# Patient Record
Sex: Female | Born: 1995 | Race: White | Hispanic: No | Marital: Single | Smoking: Current every day smoker
Health system: Southern US, Community
[De-identification: ages and names within clinical notes are randomized; demographics above are authoritative.]

## PROBLEM LIST (undated history)

## (undated) ENCOUNTER — Inpatient Hospital Stay (HOSPITAL_COMMUNITY): Payer: Self-pay

## (undated) DIAGNOSIS — N200 Calculus of kidney: Secondary | ICD-10-CM

## (undated) DIAGNOSIS — R002 Palpitations: Secondary | ICD-10-CM

## (undated) DIAGNOSIS — F32A Depression, unspecified: Secondary | ICD-10-CM

## (undated) DIAGNOSIS — R519 Headache, unspecified: Secondary | ICD-10-CM

## (undated) DIAGNOSIS — N390 Urinary tract infection, site not specified: Secondary | ICD-10-CM

## (undated) DIAGNOSIS — F329 Major depressive disorder, single episode, unspecified: Secondary | ICD-10-CM

## (undated) DIAGNOSIS — N946 Dysmenorrhea, unspecified: Secondary | ICD-10-CM

## (undated) DIAGNOSIS — G8929 Other chronic pain: Secondary | ICD-10-CM

## (undated) DIAGNOSIS — D649 Anemia, unspecified: Secondary | ICD-10-CM

## (undated) DIAGNOSIS — R51 Headache: Secondary | ICD-10-CM

## (undated) DIAGNOSIS — F419 Anxiety disorder, unspecified: Secondary | ICD-10-CM

## (undated) DIAGNOSIS — R102 Pelvic and perineal pain unspecified side: Secondary | ICD-10-CM

## (undated) DIAGNOSIS — IMO0001 Reserved for inherently not codable concepts without codable children: Secondary | ICD-10-CM

## (undated) DIAGNOSIS — N809 Endometriosis, unspecified: Secondary | ICD-10-CM

## (undated) HISTORY — DX: Other chronic pain: G89.29

## (undated) HISTORY — DX: Pelvic and perineal pain unspecified side: R10.20

## (undated) HISTORY — DX: Dysmenorrhea, unspecified: N94.6

---
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
## 2015-01-07 ENCOUNTER — Emergency Department: Payer: Self-pay | Admitting: Emergency Medicine

## 2015-01-21 ENCOUNTER — Ambulatory Visit (INDEPENDENT_AMBULATORY_CARE_PROVIDER_SITE_OTHER): Payer: Self-pay | Admitting: Nurse Practitioner

## 2015-01-21 ENCOUNTER — Encounter: Payer: Self-pay | Admitting: Nurse Practitioner

## 2015-01-21 VITALS — BP 128/64 | HR 90 | Temp 97.7°F | Resp 12

## 2015-01-21 DIAGNOSIS — L42 Pityriasis rosea: Secondary | ICD-10-CM

## 2015-01-21 DIAGNOSIS — Z7689 Persons encountering health services in other specified circumstances: Secondary | ICD-10-CM

## 2015-01-21 DIAGNOSIS — Z7189 Other specified counseling: Secondary | ICD-10-CM

## 2015-01-21 NOTE — Assessment & Plan Note (Signed)
Discussed acute and chronic issues. Reviewed health maintenance measures, PFSHx, and immunizations.   

## 2015-01-21 NOTE — Assessment & Plan Note (Signed)
Improving with corticosteroid cream and time.

## 2015-01-21 NOTE — Patient Instructions (Signed)
Welcome to [facility]!   Call us if you need us.

## 2015-01-21 NOTE — Progress Notes (Signed)
Pre visit review using our clinic review tool, if applicable. No additional management support is needed unless otherwise documented below in the visit note. 

## 2015-01-21 NOTE — Progress Notes (Signed)
Subjective:    Patient ID: Connie Fernandez, female    DOB: Apr 28, 1996, 19 y.o.   MRN: [id number]  HPI  Connie Fernandez is a 19 yo female establishing care with a CC of rash.   1) New pt info:    Diet- Eats out often, eats meat   Exercise- Not formal  Immunizations- Up to date   Pap- 2016- Normal   Eye Exam- 3 years ago   Dental Exam- UTD  2) Chronic Problems-   Dysmenorrhea controlled on Depo-provera.   3) Acute Problems-  Rash- First noticed 2 weeks ago. She reports being seen twice for this, once in the ER, and once at the Dermatology office. She was diagnosed with Pityriasis Rosea and given a steroid cream. She reports it is improving and non-pruritic currently.  Other providers-  OB[facility]GYN at [facility]- Dr. Archie Balboa   Review of Systems  Constitutional: Negative for fever, chills, diaphoresis and fatigue.  HENT: Negative for tinnitus and trouble swallowing.   Eyes: Negative for visual disturbance.  Respiratory: Negative for chest tightness, shortness of breath and wheezing.   Cardiovascular: Negative for chest pain, palpitations and leg swelling.  Gastrointestinal: Negative for nausea, vomiting, diarrhea and constipation.  Genitourinary: Negative for dysuria.  Musculoskeletal: Negative for back pain and neck pain.  Skin: Positive for rash.  Neurological: Negative for dizziness, weakness, numbness and headaches.  Hematological: Does not bruise/bleed easily.  Psychiatric/Behavioral: Negative for suicidal ideas and sleep disturbance. The patient is not nervous/anxious.    Past Medical History  Diagnosis Date  . Dysmenorrhea in adolescent     History   Social History  . Marital Status: Single    Spouse Name: N/A  . Number of Children: N/A  . Years of Education: N/A   Occupational History  . Not on file.   Social History Main Topics  . Smoking status: Never Smoker   . Smokeless tobacco: Never Used  . Alcohol Use: No  . Drug Use: No  .  Sexual Activity:    Partners: Male    Birth Control/ Protection: Condom   Other Topics Concern  . Not on file   Social History Narrative   Lives with mother, father, and 2 sisters    [employer]/video editor and works [employer]    1 dog    Right handed    Enjoys working, Chief of Staff out with friends             No past surgical history on file.  Family History  Problem Relation Age of Onset  . Diabetes Mother   . Alcohol abuse Father   . Hypertension Father   . COPD Maternal Grandmother   . Early death Maternal Grandmother 47  . Heart disease Maternal Grandmother   . Heart disease Maternal Grandfather 72  . Parkinsonism Paternal Grandmother     No Known Allergies  No current outpatient prescriptions on file prior to visit.   Current Facility-Administered Medications on File Prior to Visit  Medication Dose Route Frequency Provider Last Rate Last Dose  . medroxyPROGESTERone (DEPO-PROVERA) injection 150 mg  150 mg Intramuscular Q90 days Allie Bossier, MD   150 mg at 11/19/14 1128       Objective:   Physical Exam  Constitutional: She is oriented to person, place, and time. She appears well-developed and well-nourished. No distress.  HENT:  Head: Normocephalic and atraumatic.  Right Ear: External ear normal.  Left Ear: External ear normal.  Eyes: Right eye exhibits no  discharge. Left eye exhibits no discharge. No scleral icterus.  Neck: Normal range of motion. Neck supple. No thyromegaly present.  Cardiovascular: Normal rate, regular rhythm, normal heart sounds and intact distal pulses.  Exam reveals no gallop and no friction rub.   No murmur heard. Pulmonary/Chest: Effort normal and breath sounds normal. No respiratory distress. She has no wheezes. She has no rales. She exhibits no tenderness.  Lymphadenopathy:    She has no cervical adenopathy.  Neurological: She is alert and oriented to person, place, and time. No cranial nerve deficit. She exhibits normal muscle tone.  Coordination normal.  Skin: Skin is warm and dry. No rash noted. She is not diaphoretic.  Tattoos and septum piercing.   Psychiatric: She has a normal mood and affect. Her behavior is normal. Judgment and thought content normal.      Assessment & Plan:

## 2015-03-22 ENCOUNTER — Encounter: Payer: Self-pay | Admitting: Obstetrics & Gynecology

## 2015-03-22 ENCOUNTER — Ambulatory Visit (INDEPENDENT_AMBULATORY_CARE_PROVIDER_SITE_OTHER): Payer: BLUE CROSS/BLUE SHIELD | Admitting: Obstetrics & Gynecology

## 2015-03-22 ENCOUNTER — Telehealth: Payer: Self-pay | Admitting: *Deleted

## 2015-03-22 DIAGNOSIS — N946 Dysmenorrhea, unspecified: Secondary | ICD-10-CM

## 2015-03-22 DIAGNOSIS — Z30011 Encounter for initial prescription of contraceptive pills: Secondary | ICD-10-CM

## 2015-03-22 DIAGNOSIS — N939 Abnormal uterine and vaginal bleeding, unspecified: Secondary | ICD-10-CM | POA: Diagnosis not present

## 2015-03-22 LAB — CBC
HCT: 33.6 % — ABNORMAL LOW (ref 36.0–46.0)
Hemoglobin: 11.2 g/dL — ABNORMAL LOW (ref 12.0–15.0)
MCH: 27.9 pg (ref 26.0–34.0)
MCHC: 33.3 g/dL (ref 30.0–36.0)
MCV: 83.6 fL (ref 78.0–100.0)
MPV: 9.9 fL (ref 8.6–12.4)
Platelets: 244 10*3/uL (ref 150–400)
RBC: 4.02 MIL/uL (ref 3.87–5.11)
RDW: 13.1 % (ref 11.5–15.5)
WBC: 5.6 10*3/uL (ref 4.0–10.5)

## 2015-03-22 MED ORDER — NORETHIN ACE-ETH ESTRAD-FE 1-20 MG-MCG(24) PO TABS: 1.0000 | ORAL_TABLET | Freq: Every day | ORAL | Status: DC

## 2015-03-22 NOTE — Telephone Encounter (Signed)
Patient called and needed her birth control sent in with a 90 day supply.   I have sent in refills with 90 day refills.

## 2015-03-22 NOTE — Progress Notes (Signed)
   CLINIC ENCOUNTER NOTE  History:  19 y.o. F here today for birth control pills. Is on Accutane for acne, wants to be on contraception.  Did not like Depo Provera, missed 02/18/15 dosage and has had heavy bleeding since then. Feels very tired occasionally. Had tried Nexplanon in past, just wants to go back to OCPs. Accompanied by mother.  Past Medical History  Diagnosis Date  . Dysmenorrhea in adolescent    History reviewed. No pertinent past surgical history.  The following portions of the patient's history were reviewed and updated as appropriate: allergies, current medications, past family history, past medical history, past social history, past surgical history and problem list.   Health Maintenance: Has received Gardasil series  Review of Systems:  Comprehensive review of systems was otherwise negative.  Objective:  Physical Exam AFVSS CONSTITUTIONAL: Well-developed, well-nourished female in no acute distress.  HENT:  Normocephalic, atraumatic, External right and left ear normal. Oropharynx is clear and moist EYES: Conjunctivae and EOM are normal. Pupils are equal, round, and reactive to light. No scleral icterus.  NECK: Normal range of motion, supple, no masses SKIN: Skin is warm and dry. No rash noted. Not diaphoretic. No erythema. No pallor. NEUROLGIC: Alert and oriented to person, place, and time. Normal reflexes, muscle tone coordination. No cranial nerve deficit noted. PSYCHIATRIC: Normal mood and affect. Normal behavior. Normal judgment and thought content. CARDIOVASCULAR: Normal heart rate noted RESPIRATORY: Effort and breath sounds normal, no problems with respiration noted ABDOMEN: Soft, no distention noted.  PELVIC: Deferred MUSCULOSKELETAL: Normal range of motion. No edema and no tenderness.   Assessment & Plan:  CBC checked today Loestrin 24 Fe prescribed today, adherence emphasized. Told to take 2 pills/day for 4 days then one pill a day given current heavy  bleeding. Bleeding precautions reviewed. Routine preventative health maintenance measures emphasized.  Jaynie CollinsUGONNA  Lovinia Snare, MD, FACOG Attending Obstetrician & Gynecologist Center for [facility], [facility]

## 2015-05-11 ENCOUNTER — Encounter: Payer: Self-pay | Admitting: Family Medicine

## 2015-05-11 ENCOUNTER — Ambulatory Visit (INDEPENDENT_AMBULATORY_CARE_PROVIDER_SITE_OTHER): Payer: BLUE CROSS/BLUE SHIELD | Admitting: Family Medicine

## 2015-05-11 VITALS — BP 102/60 | HR 73 | Temp 97.6°F | Wt 115.5 lb

## 2015-05-11 DIAGNOSIS — B349 Viral infection, unspecified: Secondary | ICD-10-CM | POA: Diagnosis not present

## 2015-05-11 MED ORDER — AZITHROMYCIN 250 MG PO TABS: ORAL_TABLET | ORAL | Status: DC

## 2015-05-11 NOTE — Assessment & Plan Note (Signed)
Likely, nontoxic.  Supportive care. F/u prn.  D/w pt.  See AVS.   Would hold off on abx tx since likely viral but if discolored sputum >1 week would start zmax.   D/w pt about smoking.   Work note given.   abd pain likely from strained muscle from vomiting mult times.  No acute abd process on exam.

## 2015-05-11 NOTE — Patient Instructions (Signed)
Tylenol for pain.  Rest and fluids.  Take mucinex for the sputum/cough.  Gargle with salt water if needed.  If still with cough/discolored sputum on 05/15/15, then start the antibiotics.  Take care.

## 2015-05-11 NOTE — Progress Notes (Signed)
Pre visit review using our clinic review tool, if applicable. No additional management support is needed unless otherwise documented below in the visit note.  Sx started about 4 days ago.  Out of work yesterday due to vomiting.   Initially with vomiting Sunday, some abd pain.  No blood in vomit.  No diarrhea.   No fevers but had chills.   Some arm/muscle aches.  No rash.   ST.  Stuffy.  No ear pain.  Some cough, yellowish sputum.   Smoker, d/w pt about cessation, encouraged.   No vomiting today, still with some abd pain.    Meds, vitals, and allergies reviewed.   ROS: See HPI.  Otherwise, noncontributory.  GEN: nad, alert and oriented HEENT: mucous membranes moist, tm w/o erythema, nasal exam w/o erythema, clear discharge noted,  OP with mild cobblestoning NECK: supple w/o LA CV: rrr.   PULM: ctab, no inc wob, no wheeze, no rhonchi.  No cough during the exam.  EXT: no edema SKIN: no acute rash Abd soft, mildly ttp on the upper midline rectus sheath w/o rebound.  Rectus reproducibly sore on testing.  Normal BS.

## 2015-05-20 ENCOUNTER — Telehealth: Payer: Self-pay | Admitting: Nurse Practitioner

## 2015-05-20 ENCOUNTER — Telehealth: Payer: Self-pay

## 2015-05-20 NOTE — Telephone Encounter (Signed)
After hours RN, Corrie DandyMary called to inform NP that patient refused after triage call to go to ED per there request.  Patient has had increased Vomiting and dizziness.  S[facility] Para Marchuncan last week [facility] for same issues.

## 2015-05-20 NOTE — Telephone Encounter (Signed)
Left VM to return my call

## 2015-05-20 NOTE — Telephone Encounter (Signed)
Patient Name: Connie Fernandez DOB: 1996-02-23 Initial Comment Caller states daughter has been vomiting , on 3rd day of z-pac, can't keep water down Nurse Assessment Nurse: Deatra JamesNoe, RN, Corrie DandyMary Date/Time (Eastern Time): 05/20/2015 11:23:07 AM Confirm and document reason for call. If symptomatic, describe symptoms. ---Mother states her daughter was [facility] week and was told this was viral and if she continues to spit up green to give her Zpack and she is vomiting. She is vomiting even when she is not taking the medicine and she has been vomiting for two weeks. States her daughter called her today and told her that she feels like she is going to pass out. Has the patient traveled out of the country within the last 30 days? ---No Does the patient require triage? ---Yes Related visit to physician within the last 2 weeks? ---Yes Does the PT have any chronic conditions? (i.e. diabetes, asthma, etc.) ---No Did the patient indicate they were pregnant? ---No Guidelines Guideline Title Affirmed Question Affirmed Notes Vomiting Without Diarrhea Severe dehydration suspected (very dizzy when tries to stand or has fainted) Final Disposition User Go to ED Now Deatra JamesNoe, RN, Corrie DandyMary Comments After triage mother states she doesn't want to take her daughter to ED to be seen that she wants her doctor to see her. Disagree/Comply: Disagree Disagree/Comply Reason: Disagree with instructions

## 2015-05-20 NOTE — Telephone Encounter (Signed)
The patient is advised to be seen by urgent care or ED. If she chooses to come here we will call EMS to escort her to the ED.

## 2015-05-20 NOTE — Telephone Encounter (Signed)
Thanks

## 2015-05-20 NOTE — Telephone Encounter (Signed)
Spoke with pt, advised of Connie Fernandez message.  Pt verbalized understanding 

## 2015-05-27 ENCOUNTER — Ambulatory Visit (INDEPENDENT_AMBULATORY_CARE_PROVIDER_SITE_OTHER): Payer: BLUE CROSS/BLUE SHIELD | Admitting: Nurse Practitioner

## 2015-05-27 VITALS — BP 118/74 | HR 77 | Temp 98.5°F | Resp 14 | Ht 62.0 in | Wt 119.0 lb

## 2015-05-27 DIAGNOSIS — R112 Nausea with vomiting, unspecified: Secondary | ICD-10-CM | POA: Diagnosis not present

## 2015-05-27 NOTE — Progress Notes (Signed)
Pre visit review using our clinic review tool, if applicable. No additional management support is needed unless otherwise documented below in the visit note. 

## 2015-05-27 NOTE — Patient Instructions (Signed)
Work on stopping smoking.   Eat small meals 6 x a day.   Add- Zantac 150 mg OTC for possible acid reflux, take daily before largest meal of the day.   Please visit the lab before leaving today.

## 2015-05-27 NOTE — Progress Notes (Signed)
Subjective:    Patient ID: Connie Fernandez, female    DOB: 13-Apr-1996, 19 y.o.   MRN: [id number]  HPI  [name] is a 19 yo female with a CC of vomiting. She is accompanied by her mother today who is primary historian.   1) Patient has been seen twice before today for same symptoms. Once with Dr. Para March at [facility] and then at [facility].    Pt had full work up of labs (some abstracted) some sent to scan. Normal H. Pylori, negative Monospot, only significant finding was anemia and she was advised to follow up with her OB/GYN  X-ray showed constipation   Pt advised to try Zofran and Miralax.  Nausea- Not taking the Zofran  Miralax- Have not taken for constipation found on x-ray at [facility]  She reports not eating  Did not vomit yesterday or today  Vomit- 20th; twice at night- nauseated   Ice cream and chips- right before throwing up  Gold- color of urine she reports  "Migraines"- once-twice a week, thumping left  Sleeps them off and when wakes up normal Started last month   3 x wakes up with nausea and vomiting   Wt Readings from Last 3 Encounters:  05/27/15 119 lb (53.978 kg) (35 %*, Z = -0.37)  05/11/15 115 lb 8 oz (52.39 kg) (28 %*, Z = -0.58)  11/19/14 119 lb 3.2 oz (54.069 kg) (38 %*, Z = -0.30)   * Growth percentiles are based on CDC 2-20 Years data.   Pt reports losing 13 pounds, but has actually gained 4 lbs according to our system.   Review of Systems  Constitutional: Positive for diaphoresis and fatigue. Negative for fever and chills.  HENT: Positive for sneezing. Negative for congestion, rhinorrhea, sinus pressure and sore throat.   Eyes: Negative for pain and visual disturbance.  Respiratory: Negative for cough, chest tightness, shortness of breath and wheezing.   Cardiovascular: Negative for chest pain, palpitations and leg swelling.  Gastrointestinal: Positive for nausea, vomiting and constipation. Negative for abdominal pain, diarrhea, blood in stool, abdominal  distention, anal bleeding and rectal pain.  Endocrine: Positive for polyuria. Negative for polydipsia and polyphagia.  Genitourinary: Negative for urgency, frequency, flank pain and difficulty urinating.  Musculoskeletal: Negative for back pain and neck pain.       Tight across shoulders  Skin: Negative for rash.  Neurological: Positive for light-headedness and headaches.       Frontal headaches- tylenol/ibuprofen (not helpful)   Psychiatric/Behavioral: Positive for sleep disturbance. The patient is not nervous/anxious.    Past Medical History  Diagnosis Date  . Dysmenorrhea in adolescent     History   Social History  . Marital Status: Single    Spouse Name: N/A  . Number of Children: N/A  . Years of Education: N/A   Occupational History  . Not on file.   Social History Main Topics  . Smoking status: Current Every Day Smoker -- 1.00 packs/day    Types: Cigarettes  . Smokeless tobacco: Never Used  . Alcohol Use: No  . Drug Use: No  . Sexual Activity:    Partners: Male    Birth Control/ Protection: Condom   Other Topics Concern  . 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  Family History  Problem Relation Age of Onset  . Diabetes Mother   . Alcohol abuse Father   . Hypertension Father   . COPD Maternal Grandmother   . Early death Maternal Grandmother 20  . Heart disease Maternal Grandmother   . Heart disease Maternal Grandfather 72  . Parkinsonism Paternal Grandmother     No Known Allergies  Current Outpatient Prescriptions on File Prior to Visit  Medication Sig Dispense Refill  . azithromycin (ZITHROMAX) 250 MG tablet 2 tabs a day for 1 day and then 1 a day for 4 days. Fill on/after 05/15/15. (Patient not taking: Reported on 05/27/2015) 6 each 0   No current  facility-administered medications on file prior to visit.      Objective:   Physical Exam  Constitutional: She is oriented to person, place, and time. She appears well-developed and well-nourished. No distress.  BP 118/74 mmHg  Pulse 77  Temp(Src) 98.5 F (36.9 C)  Resp 14  Ht  (1.575 m)  Wt 119 lb (53.978 kg)  BMI 21.76 kg/m2  SpO2 99%  LMP 05/09/2015   HENT:  Head: Normocephalic and atraumatic.  Right Ear: External ear normal.  Left Ear: External ear normal.  Eyes: EOM are normal. Pupils are equal, round, and reactive to light. Right eye exhibits no discharge. Left eye exhibits no discharge. No scleral icterus.  Neck: Normal range of motion. Neck supple. No thyromegaly present.  Cardiovascular: Normal rate and regular rhythm.   No murmur heard. Pulmonary/Chest: Effort normal and breath sounds normal.  Abdominal: Soft. Bowel sounds are normal. She exhibits no distension and no mass. There is no tenderness. There is no rebound and no guarding.  Thin habitus  Lymphadenopathy:    She has no cervical adenopathy.  Neurological: She is alert and oriented to person, place, and time. No cranial nerve deficit. She exhibits normal muscle tone. Coordination normal.  Skin: Skin is warm and dry. No rash noted. She is not diaphoretic.  Tattoos and piercings   Psychiatric: Her behavior is normal. Judgment and thought content normal.  Flat affect, very little verbage      Assessment & Plan:

## 2015-05-30 ENCOUNTER — Other Ambulatory Visit: Payer: Self-pay | Admitting: Student

## 2015-05-30 DIAGNOSIS — R1084 Generalized abdominal pain: Secondary | ICD-10-CM

## 2015-05-30 LAB — CBC AND DIFFERENTIAL
HCT: 35 % — AB (ref 36–46)
Hemoglobin: 11.2 g/dL — AB (ref 12.0–16.0)
Platelets: 226 10*3/uL (ref 150–399)
WBC: 8.6 10^3/mL

## 2015-05-30 LAB — BASIC METABOLIC PANEL
BUN: 12 mg/dL (ref 4–21)
Creatinine: 0.7 mg/dL (ref 0.5–1.1)
Glucose: 88 mg/dL
Potassium: 3.8 mmol/L (ref 3.4–5.3)
Sodium: 141 mmol/L (ref 137–147)

## 2015-05-30 LAB — HEPATIC FUNCTION PANEL
ALT: 12 U/L (ref 3–30)
AST: 13 U/L (ref 2–40)
Alkaline Phosphatase: 45 U/L (ref 25–125)
Bilirubin, Total: 0.4 mg/dL

## 2015-06-03 ENCOUNTER — Telehealth: Payer: Self-pay | Admitting: Nurse Practitioner

## 2015-06-03 ENCOUNTER — Ambulatory Visit
Admission: RE | Admit: 2015-06-03 | Discharge: 2015-06-03 | Disposition: A | Discharge status: Home / Self Care | Payer: BLUE CROSS/BLUE SHIELD | Source: Ambulatory Visit | Attending: Student | Admitting: Student

## 2015-06-03 DIAGNOSIS — R1084 Generalized abdominal pain: Secondary | ICD-10-CM | POA: Insufficient documentation

## 2015-06-03 NOTE — Telephone Encounter (Signed)
Records abstracted and given to [facility] for review

## 2015-06-03 NOTE — Telephone Encounter (Addendum)
Pt mother came in and dropped off lab work would like to get freed back on results.. Please advise pt mother at [phone]. Placed in Doss box.

## 2015-06-06 ENCOUNTER — Telehealth: Payer: Self-pay | Admitting: *Deleted

## 2015-06-06 NOTE — Telephone Encounter (Signed)
Patient mother called about lab work results that she dropped off on 06/03/15. Patient mother was wondering if Connie Fernandez looked at the results yet. Is requesting a call back . Pt mother # [phone]. Please advice NP.

## 2015-06-06 NOTE — Telephone Encounter (Signed)
Informed pt's mother Misty Stanley) to have Anisten worked up by OB/GYN, take Ferrous sulfate 325 and to repeat labs in 1.5 months.  She verbalized understanding.

## 2015-06-06 NOTE — Telephone Encounter (Signed)
Spoke to pt's mother to let her know that you were given her records and will look at them. She is concerned about Connie Fernandez's iron, hemoglobin and leukocyte panels, she stated that she circled those that were either high or low or that she was concerned about. She also stated that they were still waiting for some more results.

## 2015-06-06 NOTE — Telephone Encounter (Signed)
Please let the mother know that the PA advised Satori to be seen and worked up by an OB/GYN for her anemia. She will need to take over the counter iron supplements Ferrous Sulfate 325 mg daily and have a repeat in 1.5 months (takes 3 months to regenerate red blood cells), but we can see if it trends back up. Thanks!

## 2015-06-08 ENCOUNTER — Encounter: Payer: Self-pay | Admitting: Nurse Practitioner

## 2015-06-08 NOTE — Assessment & Plan Note (Signed)
Pt has not followed any of the advice of the Urgent Care, which I concur with. She was extensively worked up and the mother is the primary historian. The daughter denies a bad home situation, but always looks to the mother to give the history. She was advised to try the recommended therapies she was prescribed (zofran and Miralax). Will repeat her CMET, will ask her to try 6 small meals daily, add Zantac 150 mg up to twice daily. Follow up in 2 weeks.

## 2015-08-10 ENCOUNTER — Encounter (HOSPITAL_COMMUNITY): Payer: Self-pay | Admitting: Emergency Medicine

## 2015-08-10 ENCOUNTER — Emergency Department (HOSPITAL_COMMUNITY)
Admission: EM | Admit: 2015-08-10 | Discharge: 2015-08-10 | Disposition: A | Discharge status: Home / Self Care | Payer: BLUE CROSS/BLUE SHIELD | Attending: Emergency Medicine | Admitting: Emergency Medicine

## 2015-08-10 DIAGNOSIS — Z720 Tobacco use: Secondary | ICD-10-CM | POA: Diagnosis not present

## 2015-08-10 DIAGNOSIS — J020 Streptococcal pharyngitis: Secondary | ICD-10-CM | POA: Insufficient documentation

## 2015-08-10 DIAGNOSIS — L03113 Cellulitis of right upper limb: Secondary | ICD-10-CM | POA: Diagnosis not present

## 2015-08-10 DIAGNOSIS — R110 Nausea: Secondary | ICD-10-CM | POA: Insufficient documentation

## 2015-08-10 DIAGNOSIS — L02413 Cutaneous abscess of right upper limb: Secondary | ICD-10-CM | POA: Insufficient documentation

## 2015-08-10 DIAGNOSIS — M791 Myalgia: Secondary | ICD-10-CM | POA: Insufficient documentation

## 2015-08-10 DIAGNOSIS — Z8742 Personal history of other diseases of the female genital tract: Secondary | ICD-10-CM | POA: Diagnosis not present

## 2015-08-10 DIAGNOSIS — J029 Acute pharyngitis, unspecified: Secondary | ICD-10-CM | POA: Diagnosis present

## 2015-08-10 DIAGNOSIS — L0390 Cellulitis, unspecified: Secondary | ICD-10-CM

## 2015-08-10 DIAGNOSIS — L0291 Cutaneous abscess, unspecified: Secondary | ICD-10-CM

## 2015-08-10 LAB — RAPID STREP SCREEN (MED CTR MEBANE ONLY): Streptococcus, Group A Screen (Direct): POSITIVE — AB

## 2015-08-10 MED ORDER — CEPHALEXIN 500 MG PO CAPS: 500.0000 mg | ORAL_CAPSULE | Freq: Four times a day (QID) | ORAL | Status: DC

## 2015-08-10 MED ORDER — PENICILLIN G BENZATHINE 1200000 UNIT/2ML IM SUSP
1.2000 10*6.[IU] | Freq: Once | INTRAMUSCULAR | Status: AC
  Administered 2015-08-10: 1.2 10*6.[IU] via INTRAMUSCULAR
  Filled 2015-08-10: qty 2

## 2015-08-10 MED ORDER — CEPHALEXIN 250 MG PO CAPS
500.0000 mg | ORAL_CAPSULE | Freq: Once | ORAL | Status: AC
  Administered 2015-08-10: 500 mg via ORAL
  Filled 2015-08-10: qty 2

## 2015-08-10 MED ORDER — IBUPROFEN 400 MG PO TABS: 400.0000 mg | ORAL_TABLET | Freq: Four times a day (QID) | ORAL | Status: DC | PRN

## 2015-08-10 MED ORDER — LIDOCAINE-EPINEPHRINE (PF) 2 %-1:200000 IJ SOLN
10.0000 mL | Freq: Once | INTRAMUSCULAR | Status: AC
  Administered 2015-08-10: 10 mL
  Filled 2015-08-10: qty 20

## 2015-08-10 MED ORDER — SULFAMETHOXAZOLE-TRIMETHOPRIM 800-160 MG PO TABS
1.0000 | ORAL_TABLET | Freq: Two times a day (BID) | ORAL | Status: AC
Start: 2015-08-10 — End: 2015-08-17

## 2015-08-10 NOTE — ED Notes (Signed)
Pt. reports sore throat with swelling , body aches , dry cough and skin redness at right forearm from an infected tattoo 2 days ago .

## 2015-08-10 NOTE — ED Provider Notes (Signed)
CSN: [id number]     Arrival date & time 08/10/15  0023 History  By signing my name below, I, Tanda Rockers, attest that this documentation has been prepared under the direction and in the presence of Derwood Kaplan, MD. Electronically Signed: Tanda Rockers, ED Scribe. 08/10/2015. 2:26 AM.  Chief Complaint  Patient presents with  . Skin Problem  . Sore Throat   The history is provided by the patient. No language interpreter was used.     HPI Comments: Connie Fernandez is a 19 y.o. female who presents to the Emergency Department complaining of gradual onset, constant, sore throat x 1 day. Pt also complains of generalized body aches, throat swelling, difficulty swallowing, voice changes, nausea, fever, and productive cough with yellow phlegm. Pt notes redness and swelling to right forearm x 1 day after getting tattoo done 2 days ago. Pt states that the tattoo parlor is certified. Pt mentions tingling sensation to the tips of the digits on her left hand which is new. Mom states that pt got another tattoo recently and was told at the tattoo parlor that they use 1 needle for every 3 patients. Denies chills, nausea, vomiting, visual changes, chest pain, shortness of breath, or any other associated symptoms. No hx IV drug abuse.   Past Medical History  Diagnosis Date  . Dysmenorrhea in adolescent    History reviewed. No pertinent past surgical history. Family History  Problem Relation Age of Onset  . Diabetes Mother   . Alcohol abuse Father   . Hypertension Father   . COPD Maternal Grandmother   . Early death Maternal Grandmother 46  . Heart disease Maternal Grandmother   . Heart disease Maternal Grandfather 72  . Parkinsonism Paternal Grandmother    Social History  Substance Use Topics  . Smoking status: Current Every Day Smoker -- 0.00 packs/day    Types: Cigarettes  . Smokeless tobacco: Never Used  . Alcohol Use: No   OB History    No data available     Review of Systems   Constitutional: Positive for fever. Negative for chills.  HENT: Positive for sore throat, trouble swallowing and voice change.   Eyes: Negative for visual disturbance.  Respiratory: Positive for cough. Negative for shortness of breath.   Cardiovascular: Negative for chest pain.  Gastrointestinal: Positive for nausea. Negative for vomiting.  Musculoskeletal: Positive for myalgias.  Skin: Positive for color change.    ROS 10 Systems reviewed and are negative for acute change except as noted in the HPI.     Allergies  Review of patient's allergies indicates no known allergies.  Home Medications   Prior to Admission medications   Medication Sig Start Date End Date Taking? Authorizing Provider  cephALEXin (KEFLEX) 500 MG capsule Take 1 capsule (500 mg total) by mouth 4 (four) times daily. 08/10/15   Derwood Kaplan, MD  ibuprofen (ADVIL,MOTRIN) 400 MG tablet Take 1 tablet (400 mg total) by mouth every 6 (six) hours as needed. 08/10/15   Derwood Kaplan, MD  sulfamethoxazole-trimethoprim (BACTRIM DS,SEPTRA DS) 800-160 MG tablet Take 1 tablet by mouth 2 (two) times daily. 08/10/15 08/17/15  Derwood Kaplan, MD   Triage Vitals: BP 126/83 mmHg  Pulse 100  Temp(Src) 98.7 F (37.1 C) (Oral)  Resp 18  Wt 120 lb (54.432 kg)  SpO2 99%  LMP 08/09/2015   Physical Exam  Constitutional: She is oriented to person, place, and time. She appears well-developed and well-nourished. No distress.  HENT:  Head: Normocephalic and atraumatic.  Eyes:  Conjunctivae and EOM are normal.  Neck: Neck supple. No tracheal deviation present.  Cardiovascular: Normal rate.   Pulmonary/Chest: Effort normal. No respiratory distress.  Musculoskeletal: Normal range of motion.  Right upper extremity proximal forearm; 6 cm diameter erythematous lesion with mild induration in the center and some blanching Although distorted from the tattoo it appears that there might be purulence at the center Elbow flexion, extension,  pronation, and supination are all normal Pt has 2+ radial and ulnar pulse Skin is warm to touch Subjective numbness in all digits distal to DIP Pt able to discriminate between sharp and dull for all digits except for right long finger Intrinsic and extrinsic muscles of the hand appear to be functioning normally  Neurological: She is alert and oriented to person, place, and time.  Skin: Skin is warm and dry.  Psychiatric: She has a normal mood and affect. Her behavior is normal.  Nursing note and vitals reviewed.   ED Course  Procedures (including critical care time)  DIAGNOSTIC STUDIES: Oxygen Saturation is 99% on RA, normal by my interpretation.    COORDINATION OF CARE: 2:26 AM-Discussed treatment plan which includes RX antibiotics and I&D with pt at bedside and pt agreed to plan.   Labs Review Labs Reviewed  RAPID STREP SCREEN (NOT AT [facility]) - Abnormal; Notable for the following:    Streptococcus, Group A Screen (Direct) POSITIVE (*)    All other components within normal limits    Imaging Review No results found. I have personally reviewed and evaluated these lab results as part of my medical decision-making.   EKG Interpretation None      MDM   Final diagnoses:  Strep pharyngitis  Cellulitis and abscess    I personally performed the services described in this documentation, which was scribed in my presence. The recorded information has been reviewed and is accurate.   Pt comes in with cc of redness to the arm and also sore throat. Pt is strep +. Pen IM given. She has no signs of deep infection to the throat (drooling, stridor, hypoxia, resp distress).  Pt had a tattoo done to the R forearm 3 days ago, started with some redness that increased over the last 2 days. She has cellulitis, with a < 0.5 mm area of induration with likely pus underneath. No signs of deep infection. Pt's ROM is normal. Initially, the patient agreed to getting it drained. I had to see new  patients, and went in at 4:20 for the procedure, pt had decided against getting the i & d. I think the abscess will likely heal w/o i&d, but strict return precautions discussed.  Pt also c/o numbness to the tips of her digits. There is no hyperpigmentation. Elbow and wrist exam are normal. There is no signs of janeway lesions, osler nodes - i.e. Endocarditis. Her cap refill is normal. Advised pcp f/u if the sx persist.   Derwood Kaplan, MD 08/10/15 [phone]

## 2015-08-10 NOTE — Discharge Instructions (Signed)
You have a cellulitis to the right forearm, take the antibiotics as prescribed. As discussed, there is a small area of abscess. Please put warm compresses to see if the abscess can drain on its own. Return to the ER immediately if the swelling, pain, redness are getting worse.  You already received the shot for strep throat infection. Take motrin for pain.  See your doctor in 1 week. Finish the antibiotic course.   Abscess An abscess is an infected area that contains a collection of pus and debris.It can occur in almost any part of the body. An abscess is also known as a furuncle or boil. CAUSES  An abscess occurs when tissue gets infected. This can occur from blockage of oil or sweat glands, infection of hair follicles, or a minor injury to the skin. As the body tries to fight the infection, pus collects in the area and creates pressure under the skin. This pressure causes pain. People with weakened immune systems have difficulty fighting infections and get certain abscesses more often.  SYMPTOMS Usually an abscess develops on the skin and becomes a painful mass that is red, warm, and tender. If the abscess forms under the skin, you may feel a moveable soft area under the skin. Some abscesses break open (rupture) on their own, but most will continue to get worse without care. The infection can spread deeper into the body and eventually into the bloodstream, causing you to feel ill.  DIAGNOSIS  Your caregiver will take your medical history and perform a physical exam. A sample of fluid may also be taken from the abscess to determine what is causing your infection. TREATMENT  Your caregiver may prescribe antibiotic medicines to fight the infection. However, taking antibiotics alone usually does not cure an abscess. Your caregiver may need to make a small cut (incision) in the abscess to drain the pus. In some cases, gauze is packed into the abscess to reduce pain and to continue draining the  area. HOME CARE INSTRUCTIONS   Only take over-the-counter or prescription medicines for pain, discomfort, or fever as directed by your caregiver.  If you were prescribed antibiotics, take them as directed. Finish them even if you start to feel better.  If gauze is used, follow your caregiver's directions for changing the gauze.  To avoid spreading the infection:  Keep your draining abscess covered with a bandage.  Wash your hands well.  Do not share personal care items, towels, or whirlpools with others.  Avoid skin contact with others.  Keep your skin and clothes clean around the abscess.  Keep all follow-up appointments as directed by your caregiver. SEEK MEDICAL CARE IF:   You have increased pain, swelling, redness, fluid drainage, or bleeding.  You have muscle aches, chills, or a general ill feeling.  You have a fever. MAKE SURE YOU:   Understand these instructions.  Will watch your condition.  Will get help right away if you are not doing well or get worse.   This information is not intended to replace advice given to you by your health care provider. Make sure you discuss any questions you have with your health care provider.   Document Released: 08/01/2005 Document Revised: 04/22/2012 Document Reviewed: 01/04/2012 Elsevier Interactive Patient Education 2016 [employer]  Cellulitis Cellulitis is an infection of the skin and the tissue beneath it. The infected area is usually red and tender. Cellulitis occurs most often in the arms and lower legs.  CAUSES  Cellulitis is caused by  bacteria that enter the skin through cracks or cuts in the skin. The most common types of bacteria that cause cellulitis are staphylococci and streptococci. SIGNS AND SYMPTOMS   Redness and warmth.  Swelling.  Tenderness or pain.  Fever. DIAGNOSIS  Your health care provider can usually determine what is wrong based on a physical exam. Blood tests may also be done. TREATMENT    Treatment usually involves taking an antibiotic medicine. HOME CARE INSTRUCTIONS   Take your antibiotic medicine as directed by your health care provider. Finish the antibiotic even if you start to feel better.  Keep the infected arm or leg elevated to reduce swelling.  Apply a warm cloth to the affected area up to 4 times per day to relieve pain.  Take medicines only as directed by your health care provider.  Keep all follow-up visits as directed by your health care provider. SEEK MEDICAL CARE IF:   You notice red streaks coming from the infected area.  Your red area gets larger or turns dark in color.  Your bone or joint underneath the infected area becomes painful after the skin has healed.  Your infection returns in the same area or another area.  You notice a swollen bump in the infected area.  You develop new symptoms.  You have a fever. SEEK IMMEDIATE MEDICAL CARE IF:   You feel very sleepy.  You develop vomiting or diarrhea.  You have a general ill feeling (malaise) with muscle aches and pains.   This information is not intended to replace advice given to you by your health care provider. Make sure you discuss any questions you have with your health care provider.   Document Released: 08/01/2005 Document Revised: 07/13/2015 Document Reviewed: 01/07/2012 Elsevier Interactive Patient Education 2016 [employer]  Pharyngitis Pharyngitis is redness, pain, and swelling (inflammation) of your pharynx.  CAUSES  Pharyngitis is usually caused by infection. Most of the time, these infections are from viruses (viral) and are part of a cold. However, sometimes pharyngitis is caused by bacteria (bacterial). Pharyngitis can also be caused by allergies. Viral pharyngitis may be spread from person to person by coughing, sneezing, and personal items or utensils (cups, forks, spoons, toothbrushes). Bacterial pharyngitis may be spread from person to person by more intimate  contact, such as kissing.  SIGNS AND SYMPTOMS  Symptoms of pharyngitis include:   Sore throat.   Tiredness (fatigue).   Low-grade fever.   Headache.  Joint pain and muscle aches.  Skin rashes.  Swollen lymph nodes.  Plaque-like film on throat or tonsils (often seen with bacterial pharyngitis). DIAGNOSIS  Your health care provider will ask you questions about your illness and your symptoms. Your medical history, along with a physical exam, is often all that is needed to diagnose pharyngitis. Sometimes, a rapid strep test is done. Other lab tests may also be done, depending on the suspected cause.  TREATMENT  Viral pharyngitis will usually get better in 3-4 days without the use of medicine. Bacterial pharyngitis is treated with medicines that kill germs (antibiotics).  HOME CARE INSTRUCTIONS   Drink enough water and fluids to keep your urine clear or pale yellow.   Only take over-the-counter or prescription medicines as directed by your health care provider:   If you are prescribed antibiotics, make sure you finish them even if you start to feel better.   Do not take aspirin.   Get lots of rest.   Gargle with 8 oz of salt water ( tsp of  salt per 1 qt of water) as often as every 1-2 hours to soothe your throat.   Throat lozenges (if you are not at risk for choking) or sprays may be used to soothe your throat. SEEK MEDICAL CARE IF:   You have large, tender lumps in your neck.  You have a rash.  You cough up green, yellow-brown, or bloody spit. SEEK IMMEDIATE MEDICAL CARE IF:   Your neck becomes stiff.  You drool or are unable to swallow liquids.  You vomit or are unable to keep medicines or liquids down.  You have severe pain that does not go away with the use of recommended medicines.  You have trouble breathing (not caused by a stuffy nose). MAKE SURE YOU:   Understand these instructions.  Will watch your condition.  Will get help right away if  you are not doing well or get worse.   This information is not intended to replace advice given to you by your health care provider. Make sure you discuss any questions you have with your health care provider.   Document Released: 10/22/2005 Document Revised: 08/12/2013 Document Reviewed: 06/29/2013 Elsevier Interactive Patient Education [employer]

## 2015-10-26 ENCOUNTER — Ambulatory Visit (INDEPENDENT_AMBULATORY_CARE_PROVIDER_SITE_OTHER): Payer: BLUE CROSS/BLUE SHIELD | Admitting: Family Medicine

## 2015-10-26 ENCOUNTER — Emergency Department
Admission: EM | Admit: 2015-10-26 | Discharge: 2015-10-27 | Disposition: A | Discharge status: Other Acute Inpatient Hospital | Payer: BLUE CROSS/BLUE SHIELD | Attending: Emergency Medicine | Admitting: Emergency Medicine

## 2015-10-26 ENCOUNTER — Encounter: Payer: Self-pay | Admitting: Family Medicine

## 2015-10-26 ENCOUNTER — Encounter: Payer: Self-pay | Admitting: Medical Oncology

## 2015-10-26 VITALS — BP 110/80 | HR 80 | Temp 97.9°F | Ht 62.0 in | Wt 115.5 lb

## 2015-10-26 DIAGNOSIS — F1310 Sedative, hypnotic or anxiolytic abuse, uncomplicated: Secondary | ICD-10-CM

## 2015-10-26 DIAGNOSIS — F329 Major depressive disorder, single episode, unspecified: Secondary | ICD-10-CM | POA: Insufficient documentation

## 2015-10-26 DIAGNOSIS — R45851 Suicidal ideations: Secondary | ICD-10-CM

## 2015-10-26 DIAGNOSIS — Y998 Other external cause status: Secondary | ICD-10-CM | POA: Diagnosis not present

## 2015-10-26 DIAGNOSIS — S30811A Abrasion of abdominal wall, initial encounter: Secondary | ICD-10-CM | POA: Insufficient documentation

## 2015-10-26 DIAGNOSIS — Z008 Encounter for other general examination: Secondary | ICD-10-CM | POA: Diagnosis present

## 2015-10-26 DIAGNOSIS — Z3202 Encounter for pregnancy test, result negative: Secondary | ICD-10-CM | POA: Diagnosis not present

## 2015-10-26 DIAGNOSIS — F419 Anxiety disorder, unspecified: Secondary | ICD-10-CM | POA: Diagnosis not present

## 2015-10-26 DIAGNOSIS — F17210 Nicotine dependence, cigarettes, uncomplicated: Secondary | ICD-10-CM | POA: Insufficient documentation

## 2015-10-26 DIAGNOSIS — Z880 Allergy status to penicillin: Secondary | ICD-10-CM | POA: Insufficient documentation

## 2015-10-26 DIAGNOSIS — F489 Nonpsychotic mental disorder, unspecified: Secondary | ICD-10-CM

## 2015-10-26 DIAGNOSIS — X788XXA Intentional self-harm by other sharp object, initial encounter: Secondary | ICD-10-CM | POA: Insufficient documentation

## 2015-10-26 DIAGNOSIS — Y9389 Activity, other specified: Secondary | ICD-10-CM | POA: Insufficient documentation

## 2015-10-26 DIAGNOSIS — Z7289 Other problems related to lifestyle: Secondary | ICD-10-CM

## 2015-10-26 DIAGNOSIS — F32A Depression, unspecified: Secondary | ICD-10-CM

## 2015-10-26 DIAGNOSIS — Y9289 Other specified places as the place of occurrence of the external cause: Secondary | ICD-10-CM | POA: Insufficient documentation

## 2015-10-26 LAB — ETHANOL: Alcohol, Ethyl (B): <5 mg/dL (ref <5)

## 2015-10-26 LAB — URINE DRUG SCREEN, QUALITATIVE (ARMC ONLY)
Amphetamines, Ur Screen: NOT DETECTED
Barbiturates, Ur Screen: NOT DETECTED
Benzodiazepine, Ur Scrn: POSITIVE — AB
Cannabinoid 50 Ng, Ur ~~LOC~~: NOT DETECTED
Cocaine Metabolite,Ur ~~LOC~~: NOT DETECTED
MDMA (Ecstasy)Ur Screen: NOT DETECTED
Methadone Scn, Ur: NOT DETECTED
Opiate, Ur Screen: NOT DETECTED
Phencyclidine (PCP) Ur S: NOT DETECTED
Tricyclic, Ur Screen: NOT DETECTED

## 2015-10-26 LAB — CBC
HCT: 41.9 % (ref 35.0–47.0)
Hemoglobin: 13.5 g/dL (ref 12.0–16.0)
MCH: 27.1 pg (ref 26.0–34.0)
MCHC: 32.3 g/dL (ref 32.0–36.0)
MCV: 83.7 fL (ref 80.0–100.0)
Platelets: 224 10*3/uL (ref 150–440)
RBC: 5 MIL/uL (ref 3.80–5.20)
RDW: 15.9 % — ABNORMAL HIGH (ref 11.5–14.5)
WBC: 6.1 10*3/uL (ref 3.6–11.0)

## 2015-10-26 LAB — COMPREHENSIVE METABOLIC PANEL
ALT: 14 U/L (ref 14–54)
AST: 18 U/L (ref 15–41)
Albumin: 5.3 g/dL — ABNORMAL HIGH (ref 3.5–5.0)
Alkaline Phosphatase: 59 U/L (ref 38–126)
Anion gap: 5 (ref 5–15)
BUN: 11 mg/dL (ref 6–20)
CO2: 28 mmol/L (ref 22–32)
Calcium: 9.4 mg/dL (ref 8.9–10.3)
Chloride: 106 mmol/L (ref 101–111)
Creatinine, Ser: 0.7 mg/dL (ref 0.44–1.00)
GFR calc Af Amer: >60 mL/min (ref >60)
GFR calc non Af Amer: >60 mL/min (ref >60)
Glucose, Bld: 103 mg/dL — ABNORMAL HIGH (ref 65–99)
Potassium: 3.8 mmol/L (ref 3.5–5.1)
Sodium: 139 mmol/L (ref 135–145)
Total Bilirubin: 0.9 mg/dL (ref 0.3–1.2)
Total Protein: 8.2 g/dL — ABNORMAL HIGH (ref 6.5–8.1)

## 2015-10-26 LAB — ACETAMINOPHEN LEVEL: Acetaminophen (Tylenol), Serum: <10 ug/mL — ABNORMAL LOW (ref 10–30)

## 2015-10-26 LAB — SALICYLATE LEVEL: Salicylate Lvl: <4 mg/dL (ref 2.8–30.0)

## 2015-10-26 LAB — POCT PREGNANCY, URINE: Preg Test, Ur: NEGATIVE

## 2015-10-26 NOTE — ED Notes (Signed)
Report received from Kimberlee NearingAndrea B., RN. Pt. Alert and oriented in no distress denies SI, HI, AVH and pain.  Pt. Instructed to come to me with problems or concerns.Will continue to monitor for safety via security cameras and Q 15 minute checks.

## 2015-10-26 NOTE — ED Notes (Signed)
Pt to triage with mother who reports pt has been cutting herself, went to PCP and was sent here for psych eval. PT reports depression and anxiety but no medications have been prescribed . Pt has a history of doing this around the age of 89 and seen psychiatrist for this but she stopped and began cutting again around age 19. Pt reports she does it to help with depression but does have thoughts of killing herself. Denies specific plan aside from cutting. Denies HI.

## 2015-10-26 NOTE — ED Notes (Signed)
BEHAVIORAL HEALTH ROUNDING Patient sleeping: Yes.   Patient alert and oriented: sleeping Behavior appropriate: Yes.  ; If no, describe: sleeping Nutrition and fluids offered: sleeping Toileting and hygiene offered: sleeping Sitter present: no Law enforcement present: Yes  and ODS 

## 2015-10-26 NOTE — ED Notes (Signed)
Pt. To ED-[facility] from ED ambulatory without difficulty, to room #3  . Report from RN. Pt. Is alert and oriented, warm and dry in no distress. Pt. Denies SI, HI, and AVH. Pt. Calm and cooperative. Pt. Made aware of security cameras and Q15 minute rounds. Pt. Encouraged to let Nursing staff know of any concerns or needs.  

## 2015-10-26 NOTE — Assessment & Plan Note (Signed)
After discussion about treatment options, I am sending her to the ED for evaluation and likely admission given suicidal thoughts as I feel that she is of danger to herself.

## 2015-10-26 NOTE — Progress Notes (Signed)
Pre visit review using our clinic review tool, if applicable. No additional management support is needed unless otherwise documented below in the visit note. 

## 2015-10-26 NOTE — Assessment & Plan Note (Signed)
Patient with signs of severe depression and likely associated anxiety. Given cutting and suicidal thoughts, sending to ED. Mother and patient are in agreement that this is the best course of action.

## 2015-10-26 NOTE — Assessment & Plan Note (Signed)
Sending to ED for assessment and likely admission.

## 2015-10-26 NOTE — ED Notes (Signed)
[facility]

## 2015-10-26 NOTE — Patient Instructions (Signed)
It was nice to see you today.  Please follow up after your hospital stay.  Merry Christmas  Dr. Adriana Simasook

## 2015-10-26 NOTE — ED Notes (Signed)
Some of patients piercings were removed and some were attempted to removed by RN and EDT. Nose rings were not removable, dermal piercing to side of face was not removable and a piercing to rt breast was not removable.  Pts clothing was removed and bagged, other belongings were sent home with mom.

## 2015-10-26 NOTE — BH Assessment (Signed)
Assessment Note  Connie Fernandez is an 19 y.o. female presenting to the ED voluntarily with her mother.  Pt was referred to the ED by her PCP for depression and anxiety.  Pt reports she is not on any medications nor has she seen a therapist for treatment of her symptoms.   Pt reports she has a history of cutting since age 54 and does it to help cope with her symptoms of depression.  Patient denies having a suicide plan but has had thoughts of killing herself.  Patient denies any HI, auditory or visual hallucinations.  Pt states she has a supportive family and would be agreeable to outpatient therapy.  Diagnosis: Depression  Past Medical History:  Past Medical History  Diagnosis Date  . Dysmenorrhea in adolescent     History reviewed. No pertinent past surgical history.  Family History:  Family History  Problem Relation Age of Onset  . Diabetes Mother   . Alcohol abuse Father   . Hypertension Father   . COPD Maternal Grandmother   . Early death Maternal Grandmother 28  . Heart disease Maternal Grandmother   . Heart disease Maternal Grandfather 72  . Parkinsonism Paternal Grandmother     Social History:  reports that she has been smoking Cigarettes.  She has been smoking about 0.00 packs per day. She has never used smokeless tobacco. She reports that she drinks alcohol. She reports that she does not use illicit drugs.  Additional Social History:  Alcohol / Drug Use History of alcohol / drug use?: No history of alcohol / drug abuse (Pt denies history of drug/alcohol use.)  CIWA: CIWA-Ar BP: 103/67 mmHg Pulse Rate: 75 COWS:    Allergies:  Allergies  Allergen Reactions  . Penicillins     Has patient had a PCN reaction causing immediate rash, facial/tongue/throat swelling, SOB or lightheadedness with hypotension: Dizziness and shaky. Has patient had a PCN reaction causing severe rash involving mucus membranes or skin necrosis: NO Has patient had a PCN reaction that required  hospitalization: NO Has patient had a PCN reaction occurring within the last 10 years: NO If all of the above answers are "NO", then may proceed with Cephalosporin use.     Home Medications:  (Not in a hospital admission)  OB/GYN Status:  Patient's last menstrual period was 10/19/2015 (approximate).  General Assessment Data Location of Assessment: [facility] ED TTS Assessment: In system Is this a Tele or Face-to-Face Assessment?: Face-to-Face Is this an Initial Assessment or a Re-assessment for this encounter?: Initial Assessment Marital status: Single Maiden name: N/A Is patient pregnant?: No Pregnancy Status: No Living Arrangements: Parent Can pt return to current living arrangement?: Yes Admission Status: Voluntary Is patient capable of signing voluntary admission?: Yes Referral Source: MD Insurance type: BCBS  Medical Screening Exam [facility] Walk-in ONLY) Medical Exam completed: Yes  Crisis Care Plan Living Arrangements: Parent Legal Guardian: Other: (self) Name of Psychiatrist: None reported Name of Therapist: None reported  Education Status Is patient currently in school?: No Current Grade: N/A Highest grade of school patient has completed: 12th Name of school: N/a Contact person: N/a  Risk to self with the past 6 months Suicidal Ideation: Yes-Currently Present Has patient been a risk to self within the past 6 months prior to admission? : No Suicidal Intent: No Has patient had any suicidal intent within the past 6 months prior to admission? : No Is patient at risk for suicide?: No Suicidal Plan?: No Has patient had any suicidal plan within the  past 6 months prior to admission? : No Access to Means: Yes Specify Access to Suicidal Means: Pt has access to razors What has been your use of drugs/alcohol within the last 12 months?: None reported Previous Attempts/Gestures: No How many times?: 0 Other Self Harm Risks: None reported Triggers for Past Attempts: None  known Intentional Self Injurious Behavior: Cutting Comment - Self Injurious Behavior: Pt has been cutting herself on the abdomen. Family Suicide History: No Recent stressful life event(s): Other (Comment) Persecutory voices/beliefs?: No Depression: Yes Depression Symptoms: Loss of interest in usual pleasures, Feeling worthless/self pity Substance abuse history and/or treatment for substance abuse?: Yes Suicide prevention information given to non-admitted patients: Not applicable  Risk to Others within the past 6 months Homicidal Ideation: No Does patient have any lifetime risk of violence toward others beyond the six months prior to admission? : No Thoughts of Harm to Others: No Current Homicidal Intent: No Current Homicidal Plan: No Access to Homicidal Means: No Identified Victim: None identified History of harm to others?: No Assessment of Violence: None Noted Violent Behavior Description: N/A Does patient have access to weapons?: No Criminal Charges Pending?: No Does patient have a court date: No Is patient on probation?: No  Psychosis Hallucinations: None noted Delusions: None noted  Mental Status Report Appearance/Hygiene: In scrubs Eye Contact: Good Motor Activity: Freedom of movement Speech: Logical/coherent Level of Consciousness: Alert Mood: Depressed Affect: Depressed, Appropriate to circumstance Anxiety Level: Minimal Thought Processes: Coherent, Relevant Judgement: Partial Orientation: Person, Place, Time, Situation, Appropriate for developmental age Obsessive Compulsive Thoughts/Behaviors: None  Cognitive Functioning Concentration: Normal Memory: Recent Intact, Remote Intact IQ: Average Insight: Fair Impulse Control: Fair Appetite: Fair Weight Loss: 0 Weight Gain: 0 Sleep: No Change Total Hours of Sleep: 8 Vegetative Symptoms: None  ADLScreening [facility] Assessment Services) Patient's cognitive ability adequate to safely complete daily activities?:  Yes Patient able to express need for assistance with ADLs?: Yes Independently performs ADLs?: Yes (appropriate for developmental age)  Prior Inpatient Therapy Prior Inpatient Therapy: No Prior Therapy Dates: N/A Prior Therapy Facilty/Provider(s): N/A Reason for Treatment: N/A  Prior Outpatient Therapy Prior Outpatient Therapy: No Prior Therapy Dates: N/A Prior Therapy Facilty/Provider(s): N/A Reason for Treatment: N/A Does patient have an ACCT team?: No Does patient have Intensive In-House Services?  : No Does patient have [facility] services? : No Does patient have P4CC services?: No  ADL Screening (condition at time of admission) Patient's cognitive ability adequate to safely complete daily activities?: Yes Patient able to express need for assistance with ADLs?: Yes Independently performs ADLs?: Yes (appropriate for developmental age)       Abuse/Neglect Assessment (Assessment to be complete while patient is alone) Physical Abuse: Denies Verbal Abuse: Denies Sexual Abuse: Denies Exploitation of patient/patient's resources: Denies Self-Neglect: Denies Values / Beliefs Cultural Requests During Hospitalization: None Spiritual Requests During Hospitalization: None Consults Spiritual Care Consult Needed: No Social Work Consult Needed: No Merchant navy officerAdvance Directives (For Healthcare) Does patient have an advance directive?: No Would patient like information on creating an advanced directive?: Yes English as a second language teacher- Educational materials given    Additional Information 1:1 In Past 12 Months?: No CIRT Risk: No Elopement Risk: No Does patient have medical clearance?: Yes     Disposition:  Disposition Initial Assessment Completed for this Encounter: Yes Disposition of Patient: Other dispositions Other disposition(s): Other (Comment) (Psych MD consult)  On Site Evaluation by:   Reviewed with Physician:    Connie Fernandez 10/26/2015 11:58 PM

## 2015-10-26 NOTE — Progress Notes (Signed)
Subjective:  Patient ID: Connie Fernandez, female    DOB: April 29, 1996  Age: 19 y.o. MRN: [id number]  CC: Cough, "Panic attacks"   HPI:  19 year old female past medical history of anxiety/depression presents to clinic today with complaints of cough.  She is accompanied by her mother and both of them have some concerns about her mental well-being.  Cough  Patient reports a 2 month history of productive cough.  No associated fever or chills.  No interventions tried.  No exacerbating or relieving factors.  She attributes this to her smoking.  Panic attacks  Patient reports that she has a history of anxiety/depression.  She states that she had issues is a child and previously saw a psychiatrist.  She states that she used to cut herself.  She states that for the past few months (since August) she's been experiencing what she believes are panic attacks.  She states that she has peers of time where she "zones out" and then subsequently began shaking and crying. She then subsequently develops numbness and tingling of her hands and feet.  Additionally, patient reports that she's been having difficulty sleeping. She gets approximately 3 hours of sleep a night.  She states that she's had recent stressors as she started a new job. She denies any stressors at home.  Also, she has started cutting herself once again. She actually has cut herself earlier today on her abdomen. She has had suicidal thoughts but denies any plan.  Of note, patient has been getting Xanax off the street to treat her symptoms.  Social Hx   Social History   Social History  . Marital Status: Single    Spouse Name: N/A  . Number of Children: N/A  . Years of Education: N/A   Social History Main Topics  . Smoking status: Current Every Day Smoker -- 0.00 packs/day    Types: Cigarettes  . Smokeless tobacco: Never Used  . Alcohol Use: 0.0 oz/week    0 Standard drinks or equivalent per week     Comment:  occasionally  . Drug Use: No  . Sexual Activity: Not Asked   Other Topics Concern  . None   Social History Narrative   Lives with mother, father, and 2 sisters    Western Film/video editor and works [employer]    1 dog    Right handed    Enjoys working, Chief of Staff out with friends            Review of Systems  Constitutional: Negative.   Respiratory: Positive for cough.   Psychiatric/Behavioral: Positive for suicidal ideas, sleep disturbance and self-injury.   Objective:  BP 110/80 mmHg  Pulse 80  Temp(Src) 97.9 F (36.6 C) (Oral)  Ht  (1.575 m)  Wt 115 lb 8 oz (52.39 kg)  BMI 21.12 kg/m2  SpO2 97%  LMP 10/19/2015 (Approximate)  BP/Weight 10/26/2015 10/26/2015 08/10/2015  Systolic BP 103 110 109  Diastolic BP 67 80 67  Wt. (Lbs) 115 115.5 120  BMI 21.03 21.12 21.94   Physical Exam  Constitutional: She is oriented to person, place, and time. She appears well-developed. No distress.  Cardiovascular: Normal rate and regular rhythm.   Pulmonary/Chest: Effort normal and breath sounds normal. No respiratory distress. She has no wheezes. She has no rales.  Neurological: She is alert and oriented to person, place, and time.  Skin:  Abdomen - numerous superficial cuts noted on the abdomen. Surrounding erythema noted. No active drainage or bleeding.  Psychiatric:  Flat affect and  depressed mood.  Vitals reviewed.  Lab Results  Component Value Date   WBC 6.1 10/26/2015   HGB 13.5 10/26/2015   HCT 41.9 10/26/2015   PLT 224 10/26/2015   GLUCOSE 103* 10/26/2015   ALT 14 10/26/2015   AST 18 10/26/2015   NA 139 10/26/2015   K 3.8 10/26/2015   CL 106 10/26/2015   CREATININE 0.70 10/26/2015   BUN 11 10/26/2015   CO2 28 10/26/2015   TSH 1.225 09/21/2014    Assessment & Plan:   Problem List Items Addressed This Visit    Benzodiazepine abuse   Deliberate self-cutting - Primary    After discussion about treatment options, I am sending her to the ED for evaluation and likely  admission given suicidal thoughts as I feel that she is of danger to herself.      Depression    Patient with signs of severe depression and likely associated anxiety. Given cutting and suicidal thoughts, sending to ED. Mother and patient are in agreement that this is the best course of action.      Suicidal thoughts    Sending to ED for assessment and likely admission.         Meds ordered this encounter  Medications  . TRI-PREVIFEM 0.18/0.215/0.25 MG-35 MCG tablet    Sig: Take 1 tablet by mouth daily. Reported on 10/26/2015    Refill:  6   30 minutes were spent face-to-face with the patient during this encounter and over half of that time was spent on counseling and coordination of care.   Follow-up: Follow up after hospital stay  Everlene OtherJayce Jerrad Mendibles DO [facility] [place]

## 2015-10-26 NOTE — ED Provider Notes (Signed)
[facility] [place] Emergency Department Provider Note  ____________________________________________  Time seen: Approximately 6:40 PM  I have reviewed the triage vital signs and the nursing notes.   HISTORY  Chief Complaint Psychiatric Evaluation    HPI Connie Fernandez is a 19 y.o. female with a history of anxiety and depression who is presenting today after cutting her stomach with a "razor blade." She says that she has had increased anxiety and depression but does not report any causative factor for the worsening of her symptoms. She is not taking any medication at home. Denies any suicidal ideation. Says that her shots are up-to-date and she has had a tetanus shot within the past 10 years. She made multiple cuts to her abdomen today.   Past Medical History  Diagnosis Date  . Dysmenorrhea in adolescent     Patient Active Problem List   Diagnosis Date Noted  . Nausea with vomiting 06/08/2015  . Encounter to establish care 01/21/2015  . Pityriasis rosea 01/21/2015    History reviewed. No pertinent past surgical history.  Current Outpatient Rx  Name  Route  Sig  Dispense  Refill  . TRI-PREVIFEM 0.18/0.215/0.25 MG-35 MCG tablet   Oral   Take 1 tablet by mouth daily. Reported on 10/26/2015      6     Dispense as written.     Allergies Penicillins  Family History  Problem Relation Age of Onset  . Diabetes Mother   . Alcohol abuse Father   . Hypertension Father   . COPD Maternal Grandmother   . Early death Maternal Grandmother 24  . Heart disease Maternal Grandmother   . Heart disease Maternal Grandfather 72  . Parkinsonism Paternal Grandmother     Social History Social History  Substance Use Topics  . Smoking status: Current Every Day Smoker -- 0.00 packs/day    Types: Cigarettes  . Smokeless tobacco: Never Used  . Alcohol Use: 0.0 oz/week    0 Standard drinks or equivalent per week     Comment: occasionally    Review of  Systems Constitutional: No fever/chills Eyes: No visual changes. ENT: No sore throat. Cardiovascular: Denies chest pain. Respiratory: Denies shortness of breath. Gastrointestinal: No abdominal pain.  No nausea, no vomiting.  No diarrhea.  No constipation. Genitourinary: Negative for dysuria. Musculoskeletal: Negative for back pain. Skin: Negative for rash. Neurological: Negative for headaches, focal weakness or numbness.  10-point ROS otherwise negative.  ____________________________________________   PHYSICAL EXAM:  VITAL SIGNS: ED Triage Vitals  Enc Vitals Group     BP 10/26/15 1639 121/66 mmHg     Pulse Rate 10/26/15 1639 87     Resp 10/26/15 1639 18     Temp 10/26/15 1639 97.6 F (36.4 C)     Temp Source 10/26/15 1639 Oral     SpO2 10/26/15 1639 100 %     Weight 10/26/15 1639 115 lb (52.164 kg)     Height 10/26/15 1639  (1.575 m)     Head Cir --      Peak Flow --      Pain Score 10/26/15 1645 6     Pain Loc --      Pain Edu? --      Excl. in GC? --     Constitutional: Alert and oriented. Well appearing and in no acute distress. Eyes: Conjunctivae are normal. PERRL. EOMI. Head: Atraumatic. Nose: No congestion/rhinnorhea. Mouth/Throat: Mucous membranes are moist.   Neck: No stridor.   Cardiovascular: Normal rate, regular  rhythm. Grossly normal heart sounds.  Good peripheral circulation. Respiratory: Normal respiratory effort.  No retractions. Lungs CTAB. Gastrointestinal: Soft and nontender. No distention.  Musculoskeletal: No lower extremity tenderness nor edema.  No joint effusions. Neurologic:  Normal speech and language. No gross focal neurologic deficits are appreciated. No gait instability. Skin:  Skin is warm, dry.  Superficial lacerations to almost the entire area of the skin of the abdominal wall. There is no active bleeding. There is no surrounding induration or pus. Psychiatric: Mood and affect are normal. Speech and behavior are  normal.  ____________________________________________   LABS (all labs ordered are listed, but only abnormal results are displayed)  Labs Reviewed  COMPREHENSIVE METABOLIC PANEL - Abnormal; Notable for the following:    Glucose, Bld 103 (*)    Total Protein 8.2 (*)    Albumin 5.3 (*)    All other components within normal limits  ACETAMINOPHEN LEVEL - Abnormal; Notable for the following:    Acetaminophen (Tylenol), Serum <10 (*)    All other components within normal limits  CBC - Abnormal; Notable for the following:    RDW 15.9 (*)    All other components within normal limits  ETHANOL  SALICYLATE LEVEL  URINE DRUG SCREEN, QUALITATIVE ([facility] ONLY)  POC URINE PREG, ED   ____________________________________________  EKG   ____________________________________________  RADIOLOGY   ____________________________________________   PROCEDURES    ____________________________________________   INITIAL IMPRESSION / ASSESSMENT AND PLAN / ED COURSE  Pertinent labs & imaging results that were available during my care of the patient were reviewed by me and considered in my medical decision making (see chart for details).  Patient with a large amount of cutting to her anterior abdominal wall. I will involuntarily commit the patient to be seen by the psychiatrist. Explained this to the patient understands that she will likely be her overnight. She understands the plan and is willing to comply. ____________________________________________   FINAL CLINICAL IMPRESSION(S) / ED DIAGNOSES  Anxiety and depression. Self mutilation.    Myrna Blazeravid Matthew Tanner Yeley, MD 10/26/15 [phone]

## 2015-10-27 MED ORDER — LORAZEPAM 0.5 MG PO TABS: 0.5000 mg | ORAL_TABLET | Freq: Four times a day (QID) | ORAL | Status: DC | PRN

## 2015-10-27 MED ORDER — QUETIAPINE FUMARATE 25 MG PO TABS
50.0000 mg | ORAL_TABLET | Freq: Every day | ORAL | Status: DC
  Administered 2015-10-27: 50 mg via ORAL
  Filled 2015-10-27: qty 2

## 2015-10-27 NOTE — ED Notes (Signed)
Pt accepting that she will be admitted to the Behavioral Med unit downstairs.  No noted distress or abnormal behaviors noted. Will continue 15 minute checks and observation by security camera for safety.

## 2015-10-27 NOTE — ED Notes (Signed)
Pt. Noted in room resting quietly;. No complaints or concerns voiced. No distress or abnormal behavior noted. Will continue to monitor with security cameras. Q 15 minute rounds continue. 

## 2015-10-27 NOTE — ED Notes (Signed)
Breakfast left in pt's room. Pt stated "Im not hungry for breakfast."

## 2015-10-27 NOTE — ED Notes (Signed)
Report received from Amy RN  

## 2015-10-27 NOTE — Consult Note (Signed)
[place] [facility] Consult   Reason for Consult:  Depression and involuntary commitment Referring Physician:  Dr. Edd Fabian Patient Identification: Connie Fernandez MRN:  [id number] Principal Diagnosis: Panic disorder without agoraphobia                                     Rule out bipolar disorder Diagnosis:   Patient Active Problem List   Diagnosis Date Noted  . Deliberate self-cutting [F48.9] 10/26/2015  . Suicidal thoughts [R45.851] 10/26/2015  . Depression [F32.9] 10/26/2015  . Benzodiazepine abuse [F13.10] 10/26/2015  . Nausea with vomiting [R11.2] 06/08/2015  . Encounter to establish care [Z71.89] 01/21/2015  . Pityriasis rosea [L42] 01/21/2015    Total Time spent with patient: 1 hour   Subjective:   Connie Fernandez is a 19 y.o. female patient  presenting to the ED voluntarily with her mother. Pt was referred to the ED by her PCP at [facility]  for depression and anxiety and self-mutilation.   HPI:    Most of the history was obtained from the patient as well as review of her records. 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 She reported that she has been having panic attacks over there and they have to send her back home. She reported that she is unable to finish her work in [employer]. Patient reported that she has been feeling progressively worse and has been feeling depressed hopeless helpless and is unable to sleep at night. She is only sleeping for 2 hours every day. Patient reported that she has been cutting herself and was having thoughts to kill herself yesterday. She took a razor blade and had several cuts on her abdomen. She called her girlfriend who was at work and then  also called her mother. They took her to her primary care physician who decided that  patient needs admission due to worsening of her depressive symptoms. Patient was minimizing her symptoms and reported that she was advised to see a psychiatrist as she is unable to cope with her symptoms at this time. She reported that she has racing thoughts worsening anxiety as she has been buying Xanax from the streets. She reported that she is not taking any other medications at this time. She reported that she has good relationship with her girlfriend. She has been living with her since May. She reported that her family members are also supportive at this time. She currently denied having any use of other drugs including alcohol and other illicit drugs. She has several cuts on her abdomen and reported that they were initially bleeding when she was a razor blade to cut on her abdomen .    Past Psychiatric History:  Patient reported that she has seen a psychiatrist when she was young. Patient reported that she does not take any psychotropic medications at this time. She was in therapy in the past.  Risk to Self: Suicidal Ideation: Yes-Currently Present Suicidal Intent: No Is patient at risk for suicide?: No Suicidal Plan?: No Access to Means: Yes Specify Access to Suicidal Means: Pt has access to razors What has been your use of drugs/alcohol within the last 12 months?: None reported How many times?: 0 Other Self  Harm Risks: None reported Triggers for Past Attempts: None known Intentional Self Injurious Behavior: Cutting Comment - Self Injurious Behavior: Pt has been cutting herself on the abdomen. Risk to Others: Homicidal Ideation: No Thoughts of Harm to Others: No Current Homicidal Intent: No Current Homicidal Plan: No Access to Homicidal Means: No Identified Victim: None identified History of harm to others?: No Assessment of Violence: None Noted Violent Behavior Description: N/A Does patient have access to weapons?: No Criminal Charges Pending?: No Does patient have a court  date: No Prior Inpatient Therapy: Prior Inpatient Therapy: No Prior Therapy Dates: N/A Prior Therapy Facilty/Provider(s): N/A Reason for Treatment: N/A Prior Outpatient Therapy: Prior Outpatient Therapy: No Prior Therapy Dates: N/A Prior Therapy Facilty/Provider(s): N/A Reason for Treatment: N/A Does patient have an ACCT team?: No Does patient have Intensive In-House Services?  : No Does patient have [facility] services? : No Does patient have P4CC services?: No  Past Medical History:  Past Medical History  Diagnosis Date  . Dysmenorrhea in adolescent    History reviewed. No pertinent past surgical history. Family History:  Family History  Problem Relation Age of Onset  . Diabetes Mother   . Alcohol abuse Father   . Hypertension Father   . COPD Maternal Grandmother   . Early death Maternal Grandmother 39  . Heart disease Maternal Grandmother   . Heart disease Maternal Grandfather 72  . Parkinsonism Paternal Grandmother    Family Psychiatric  History: None reported Social History:  History  Alcohol Use  . 0.0 oz/week  . 0 Standard drinks or equivalent per week    Comment: occasionally     History  Drug Use No    Social History   Social History  . Marital Status: Single    Spouse Name: N/A  . Number of Children: N/A  . Years of Education: N/A   Social History Main Topics  . Smoking status: Current Every Day Smoker -- 0.00 packs/day    Types: Cigarettes  . Smokeless tobacco: Never Used  . Alcohol Use: 0.0 oz/week    0 Standard drinks or equivalent per week     Comment: occasionally  . Drug Use: No  . Sexual Activity: Not Asked   Other Topics Concern  . None   Social History Narrative   Lives with mother, father, and 2 sisters    [employer] underwriter and works Biomedical scientist    1 dog    Right handed    Enjoys working, Oceanographer out with friends            Additional Social History:    History of alcohol / drug use?: No history of alcohol / drug abuse (Pt  denies history of drug/alcohol use.)                     Allergies:   Allergies  Allergen Reactions  . Penicillins     Has patient had a PCN reaction causing immediate rash, facial/tongue/throat swelling, SOB or lightheadedness with hypotension: Dizziness and shaky. Has patient had a PCN reaction causing severe rash involving mucus membranes or skin necrosis: NO Has patient had a PCN reaction that required hospitalization: NO Has patient had a PCN reaction occurring within the last 10 years: NO If all of the above answers are "NO", then may proceed with Cephalosporin use.     Labs:  Results for orders placed or performed during the hospital encounter of 10/26/15 (from the past 48 hour(s))  Comprehensive metabolic panel  Status: Abnormal   Collection Time: 10/26/15  4:51 PM  Result Value Ref Range   Sodium 139 135 - 145 mmol/L   Potassium 3.8 3.5 - 5.1 mmol/L   Chloride 106 101 - 111 mmol/L   CO2 28 22 - 32 mmol/L   Glucose, Bld 103 (H) 65 - 99 mg/dL   BUN 11 6 - 20 mg/dL   Creatinine, Ser 0.70 0.44 - 1.00 mg/dL   Calcium 9.4 8.9 - 10.3 mg/dL   Total Protein 8.2 (H) 6.5 - 8.1 g/dL   Albumin 5.3 (H) 3.5 - 5.0 g/dL   AST 18 15 - 41 U/L   ALT 14 14 - 54 U/L   Alkaline Phosphatase 59 38 - 126 U/L   Total Bilirubin 0.9 0.3 - 1.2 mg/dL   GFR calc non Af Amer >60 >60 mL/min   GFR calc Af Amer >60 >60 mL/min    Comment: (NOTE) The eGFR has been calculated using the CKD EPI equation. This calculation has not been validated in all clinical situations. eGFR's persistently <60 mL/min signify possible Chronic Kidney Disease.    Anion gap 5 5 - 15  Ethanol (ETOH)     Status: None   Collection Time: 10/26/15  4:51 PM  Result Value Ref Range   Alcohol, Ethyl (B) <5 <5 mg/dL    Comment:        LOWEST DETECTABLE LIMIT FOR SERUM ALCOHOL IS 5 mg/dL FOR MEDICAL PURPOSES ONLY   Salicylate level     Status: None   Collection Time: 10/26/15  4:51 PM  Result Value Ref Range    Salicylate Lvl <5.1 2.8 - 30.0 mg/dL  Acetaminophen level     Status: Abnormal   Collection Time: 10/26/15  4:51 PM  Result Value Ref Range   Acetaminophen (Tylenol), Serum <10 (L) 10 - 30 ug/mL    Comment:        THERAPEUTIC CONCENTRATIONS VARY SIGNIFICANTLY. A RANGE OF 10-30 ug/mL MAY BE AN EFFECTIVE CONCENTRATION FOR MANY PATIENTS. HOWEVER, SOME ARE BEST TREATED AT CONCENTRATIONS OUTSIDE THIS RANGE. ACETAMINOPHEN CONCENTRATIONS >150 ug/mL AT 4 HOURS AFTER INGESTION AND >50 ug/mL AT 12 HOURS AFTER INGESTION ARE OFTEN ASSOCIATED WITH TOXIC REACTIONS.   CBC     Status: Abnormal   Collection Time: 10/26/15  4:51 PM  Result Value Ref Range   WBC 6.1 3.6 - 11.0 K/uL   RBC 5.00 3.80 - 5.20 MIL/uL   Hemoglobin 13.5 12.0 - 16.0 g/dL   HCT 41.9 35.0 - 47.0 %   MCV 83.7 80.0 - 100.0 fL   MCH 27.1 26.0 - 34.0 pg   MCHC 32.3 32.0 - 36.0 g/dL   RDW 15.9 (H) 11.5 - 14.5 %   Platelets 224 150 - 440 K/uL  Urine Drug Screen, Qualitative ([facility] only)     Status: Abnormal   Collection Time: 10/26/15  4:51 PM  Result Value Ref Range   Tricyclic, Ur Screen NONE DETECTED NONE DETECTED   Amphetamines, Ur Screen NONE DETECTED NONE DETECTED   MDMA (Ecstasy)Ur Screen NONE DETECTED NONE DETECTED   Cocaine Metabolite,Ur [place] NONE DETECTED NONE DETECTED   Opiate, Ur Screen NONE DETECTED NONE DETECTED   Phencyclidine (PCP) Ur S NONE DETECTED NONE DETECTED   Cannabinoid 50 Ng, Ur [place] NONE DETECTED NONE DETECTED   Barbiturates, Ur Screen NONE DETECTED NONE DETECTED   Benzodiazepine, Ur Scrn POSITIVE (A) NONE DETECTED   Methadone Scn, Ur NONE DETECTED NONE DETECTED    Comment: (NOTE) 761  Tricyclics,  urine               Cutoff 1000 ng/mL 200  Amphetamines, urine             Cutoff 1000 ng/mL 300  MDMA (Ecstasy), urine           Cutoff 500 ng/mL 400  Cocaine Metabolite, urine       Cutoff 300 ng/mL 500  Opiate, urine                   Cutoff 300 ng/mL 600  Phencyclidine (PCP), urine      Cutoff 25  ng/mL 700  Cannabinoid, urine              Cutoff 50 ng/mL 800  Barbiturates, urine             Cutoff 200 ng/mL 900  Benzodiazepine, urine           Cutoff 200 ng/mL 1000 Methadone, urine                Cutoff 300 ng/mL 1100 1200 The urine drug screen provides only a preliminary, unconfirmed 1300 analytical test result and should not be used for non-medical 1400 purposes. Clinical consideration and professional judgment should 1500 be applied to any positive drug screen result due to possible 1600 interfering substances. A more specific alternate chemical method 1700 must be used in order to obtain a confirmed analytical result.  1800 Gas chromato graphy / mass spectrometry (GC/MS) is the preferred 1900 confirmatory method.   Pregnancy, urine POC     Status: None   Collection Time: 10/26/15 11:17 PM  Result Value Ref Range   Preg Test, Ur NEGATIVE NEGATIVE    Comment:        THE SENSITIVITY OF THIS METHODOLOGY IS >24 mIU/mL     No current facility-administered medications for this encounter.   Current Outpatient Prescriptions  Medication Sig Dispense Refill  . TRI-PREVIFEM 0.18/0.215/0.25 MG-35 MCG tablet Take 1 tablet by mouth Fernandez. Reported on 10/26/2015  6    Musculoskeletal: Strength & Muscle Tone: within normal limits Gait & Station: normal Patient leans: N/A  Psychiatric Specialty Exam: Review of Systems  Psychiatric/Behavioral: Positive for depression and suicidal ideas. The patient is nervous/anxious and has insomnia.     Blood pressure 110/60, pulse 63, temperature 97.9 F (36.6 C), temperature source Oral, resp. rate 16, height 5' 2"  (1.575 m), weight 115 lb (52.164 kg), last menstrual period 10/19/2015, SpO2 100 %.Body mass index is 21.03 kg/(m^2).  General Appearance: Casual and Fairly Groomed  Engineer, water::  Fair  Speech:  Clear and Coherent and Slow  Volume:  Decreased  Mood:  Anxious and Depressed  Affect:  Blunt and Congruent  Thought Process:   Coherent  Orientation:  Full (Time, Place, and Person)  Thought Content:  WDL  Suicidal Thoughts:  Yes.  without intent/plan  Homicidal Thoughts:  No  Memory:  Immediate;   Fair  Judgement:  Fair  Insight:  Fair  Psychomotor Activity:  Normal  Concentration:  Fair  Recall:  [employer] of Knowledge:Fair  Language: Fair  Akathisia:  No  Handed:  Right  AIMS (if indicated):     Assets:  Communication Skills Desire for Improvement Physical Health Social Support  ADL's:  Intact  Cognition: WNL  Sleep:      Treatment Plan Summary: Medication management  Disposition: Recommend psychiatric Inpatient admission when medically cleared.   Patient will be admitted to the inpatient  behavioral health unit for stabilization and safety. She will be evaluated by the treatment team and will be started on medications to help with her impulsive behavior.  I will give her seroquel 50 mg by mouth Fernandez at bedtime for mood stabilization and insomnia.  Thank you for allowing me to participate in the care of this patient.      This note was generated in part or whole with voice recognition software. Voice regonition is usually quite accurate but there are transcription errors that can and very often do occur. I apologize for any typographical errors that were not detected and corrected.   Rainey Pines, MD  10/27/2015 10:41 AM

## 2015-10-27 NOTE — ED Notes (Signed)
Patient resting quietly in room. Denies SI/HI and AVH. Pt stated "Im just ready to get out of here." Denies pain. No noted distress or abnormal behaviors noted. Will continue 15 minute checks and observation by security camera for safety. Pt told to come to RN with any concerns.

## 2015-10-27 NOTE — ED Notes (Signed)
Pt is visiting with her father. Patient relations is monitoring the visit.

## 2015-10-27 NOTE — ED Notes (Signed)
Patient asleep in room. No noted distress or abnormal behavior. Will continue 15 minute checks and observation by security cameras for safety. 

## 2015-10-27 NOTE — ED Notes (Signed)
Pt. To ED-BHU from ED ambulatory without difficulty, to room  . Report from RN. Pt. Is alert and oriented, warm and dry in no distress. Pt. Verbalizes having SI; denies having HI, and AVH. Pt. Calm and cooperative. Pt. Made aware of security cameras and Q15 minute rounds. Pt. Encouraged to let Nursing staff know of any concerns or needs.  

## 2015-10-27 NOTE — ED Notes (Signed)
Patient comes to the nurses station tearful and stating that she wants to call her mother.  Patient is informed of phone time hours.  Patient states that the other patient who is behind a locked door is scaring her because he is being noisy and pulling on the locked door repeatedly.  Patient is taken back to her room and given emotional support.

## 2015-10-27 NOTE — ED Notes (Signed)
Pt making a phone call. ?

## 2015-10-27 NOTE — ED Notes (Signed)
Psychiatrist at bedside

## 2015-10-27 NOTE — ED Notes (Signed)
Pt's mother is visiting. Pt relations representative is monitoring visit. No noted distress or abnormal behaviors noted. Will continue 15 minute checks and observation by security camera for safety.

## 2015-10-27 NOTE — ED Notes (Signed)

## 2015-10-27 NOTE — ED Notes (Signed)
Pt resting quietly. Pt was told the downstairs unit is not ready to admit her at this time. Pt was accepting. Remains calm and cooperative. No noted distress or abnormal behaviors noted. Will continue 15 minute checks and observation by security camera for safety.

## 2015-10-27 NOTE — ED Notes (Signed)
Report received from Kimberlee NearingAndrea B., RN. Pt. Alert and oriented in no distress; verbalizes having SI; denies having HI, AVH and pain.  Pt. Instructed to come to me with problems or concerns.Will continue to monitor for safety via security cameras and Q 15 minute checks.

## 2015-10-27 NOTE — ED Notes (Signed)
Pt crying when mother left after visit. RN went to speak with pt shortly after. Pt had calmed down and did not want PRN medication for anxiety. Pt was was given information on the Behavioral Med unit downstairs. Pt accepting. Cooperative, pleasant with Clinical research associatewriter. Mother appears to be very supportive of her daughter. Pt states she has a good relationship wit her mother. No noted distress or abnormal behavior. Will continue 15 minute checks and observation by security cameras for safety.

## 2015-10-27 NOTE — ED Notes (Signed)
Pt watching tv in her room. No complaints noted.  No noted distress or abnormal behaviors noted. Will continue 15 minute checks and observation by security camera for safety.

## 2015-10-27 NOTE — ED Notes (Signed)
Per Joni ReiningNicole TTS, behavioral med unit is not ready to admit pt due to patients acting out on the unit.

## 2015-10-27 NOTE — ED Notes (Signed)
Patient resting quietly in room. No noted distress or abnormal behaviors noted. Will continue 15 minute checks and observation by security camera for safety. 

## 2015-10-27 NOTE — ED Notes (Signed)
Pt. Noted in room awake; tearfull. Requesting to leave;Marland Kitchen. No distress or abnormal behavior noted. Will continue to monitor with security cameras. Q 15 minute rounds continue.

## 2015-10-27 NOTE — ED Notes (Signed)
Pt. Noted in room awake; tearfull; requesting to make calls; and to leave; staff explained IVC process; and BHU process;Marland Kitchen. No complaints or concerns voiced. No distress or abnormal behavior noted. Will continue to monitor with security cameras. Q 15 minute rounds continue.

## 2015-10-27 NOTE — ED Notes (Addendum)
Pt. Noted in resting quietly;. No complaints or concerns voiced. No distress or abnormal behavior noted. Will continue to monitor with security cameras. Q 15 minute rounds continue.

## 2015-10-27 NOTE — ED Notes (Signed)
Pt stated she has a positive relationship with her mom and girlfriend. Has not been seeing a psychiatrist because [place]. Pt stated she has been taking Xanax, but does not have a prescription. Cooperative, but anxious about being here.

## 2015-10-28 ENCOUNTER — Inpatient Hospital Stay
Admit: 2015-10-28 | Discharge: 2015-10-29 | DRG: 885 | Disposition: A | Discharge status: Home / Self Care | Payer: BLUE CROSS/BLUE SHIELD | Source: Ambulatory Visit | Attending: Psychiatry | Admitting: Psychiatry

## 2015-10-28 DIAGNOSIS — F419 Anxiety disorder, unspecified: Secondary | ICD-10-CM

## 2015-10-28 DIAGNOSIS — F17200 Nicotine dependence, unspecified, uncomplicated: Secondary | ICD-10-CM

## 2015-10-28 DIAGNOSIS — Z811 Family history of alcohol abuse and dependence: Secondary | ICD-10-CM

## 2015-10-28 DIAGNOSIS — F321 Major depressive disorder, single episode, moderate: Principal | ICD-10-CM

## 2015-10-28 DIAGNOSIS — Z833 Family history of diabetes mellitus: Secondary | ICD-10-CM | POA: Diagnosis not present

## 2015-10-28 DIAGNOSIS — Z720 Tobacco use: Secondary | ICD-10-CM

## 2015-10-28 DIAGNOSIS — R45851 Suicidal ideations: Secondary | ICD-10-CM | POA: Diagnosis present

## 2015-10-28 DIAGNOSIS — F410 Panic disorder [episodic paroxysmal anxiety] without agoraphobia: Secondary | ICD-10-CM | POA: Diagnosis present

## 2015-10-28 DIAGNOSIS — Z8249 Family history of ischemic heart disease and other diseases of the circulatory system: Secondary | ICD-10-CM

## 2015-10-28 DIAGNOSIS — G4700 Insomnia, unspecified: Secondary | ICD-10-CM | POA: Diagnosis present

## 2015-10-28 DIAGNOSIS — Z880 Allergy status to penicillin: Secondary | ICD-10-CM | POA: Diagnosis not present

## 2015-10-28 DIAGNOSIS — F339 Major depressive disorder, recurrent, unspecified: Secondary | ICD-10-CM

## 2015-10-28 HISTORY — DX: Nicotine dependence, unspecified, uncomplicated: F17.200

## 2015-10-28 LAB — TSH: TSH: 0.436 u[IU]/mL (ref 0.350–4.500)

## 2015-10-28 MED ORDER — SERTRALINE HCL 25 MG PO TABS: 12.5000 mg | ORAL_TABLET | Freq: Every day | ORAL | Status: DC

## 2015-10-28 MED ORDER — ALUM & MAG HYDROXIDE-SIMETH 200-200-20 MG/5ML PO SUSP: 30.0000 mL | ORAL | Status: DC | PRN

## 2015-10-28 MED ORDER — SERTRALINE HCL 25 MG PO TABS
12.5000 mg | ORAL_TABLET | Freq: Every day | ORAL | Status: DC
  Administered 2015-10-28 – 2015-10-29 (×2): 12.5 mg via ORAL
  Filled 2015-10-28 (×2): qty 1

## 2015-10-28 MED ORDER — NICOTINE 21 MG/24HR TD PT24
21.0000 mg | MEDICATED_PATCH | Freq: Every day | TRANSDERMAL | Status: DC
  Filled 2015-10-28: qty 1

## 2015-10-28 MED ORDER — MAGNESIUM HYDROXIDE 400 MG/5ML PO SUSP: 30.0000 mL | Freq: Every day | ORAL | Status: DC | PRN

## 2015-10-28 MED ORDER — LORAZEPAM 0.5 MG PO TABS: 0.2500 mg | ORAL_TABLET | Freq: Three times a day (TID) | ORAL | Status: DC | PRN

## 2015-10-28 MED ORDER — ACETAMINOPHEN 325 MG PO TABS: 650.0000 mg | ORAL_TABLET | Freq: Four times a day (QID) | ORAL | Status: DC | PRN

## 2015-10-28 MED ORDER — TRAZODONE HCL 50 MG PO TABS
50.0000 mg | ORAL_TABLET | Freq: Every evening | ORAL | Status: DC | PRN
  Administered 2015-10-29: 50 mg via ORAL
  Filled 2015-10-28: qty 1

## 2015-10-28 NOTE — [facility] Group Notes (Deleted)
[facility]   10/28/2015 10:09 AM  Participation Quality:  None   Mood/Affect:  Tearful  Depression Rating:  Unable to rate   Anxiety Rating:  Unable to rate   Thoughts of Suicide:  Unable to state  Will you contract for safety?   Unable to state   Current AVH:  Unable to state   Plan for Discharge/Comments:  Pt attended group and stayed the entire time. She became tearful when CSW asked her for her name. She was unable to answer any questions or state her name.   Transportation Means: Unknown   Supports: Unknown   Moya Duan [employer] Nikolos Billig MSW, [employer]CSWA

## 2015-10-28 NOTE — [facility] Group Notes (Signed)
[facility]   10/28/2015 4:06 PM  Participation Quality:  Did not attend.   Connie Fernandez L Connie Fernandez MSW, [address]CSWA

## 2015-10-28 NOTE — Progress Notes (Addendum)
Pt is a 19 y.o. Female who went voluntarily to ED with her mother but later was made IVC. Pt was referred to ED by her PCP at [facility] for depression, anxiety and self-mutilation. During the interview patient was stated she had been feeling depressed and anxious for the past 2 months since she started this job at [employer] in [place]. She works in the third shift from 4:30 PM to 2 AM in the morning and works 60 hours a week. She reported that she has been having panic attacks over there and they have to send her back home and later she was fired. She states this is her largest stressor. Patient reported that she has been feeling progressively worse and has been feeling depressed hopeless and helpless and is unable to sleep at night. She is only sleeping for 2 hours every day. Patient reported that she has been cutting herself with a razor blade that she has since gotten rid of.  She was minimizing her situation and is wanting to be discharged before Christmas. Pt states she suffers from anxiety and depression and would like help with these issues. She reported that she is not taking any other medications at this time. She reported that she has good relationship with her girlfriend. She has been living with her since May. She reported that her family members are also supportive at this time. She currently denied having any use of other drugs including alcohol and other illicit drugs. Skin assessment completed. Pt has several healed abrasions to abdomen and Tattoos on her back, right and left arm, right thigh, hip, and upper torso. Pt was anxious during interview, but cooperative and calm.

## 2015-10-28 NOTE — Progress Notes (Signed)
Recreation Therapy Notes  Date: 12.23.16 Time: 3:00 pm Location: Craft Room  Group Topic: Coping Skills  Goal Area(s) Addresses:  Patient will participate in coping skill. Patient will verbalize emotion experienced during activity.  Behavioral Response: Did not attend  Intervention: Coloring  Activity: Patients were given coloring sheets and instructed to color while thinking about what emotions there were experiencing.  Education: LRT educated patients on healthy coping skills.  Education Outcome: Patient did not attend group.   Clinical Observations/Feedback: Patient did not attend group.  Jacquelynn CreeGreene,Sergio Zawislak M, LRT/CTRS 10/28/2015 4:31 PM

## 2015-10-28 NOTE — B[facility] Group Notes (Signed)
[facility] Group Notes:  (Nursing/MHT/Case Management/Adjunct)  Date:  10/28/2015  Time:  3:26 PM  Type of Therapy:  Music Therapy  Participation Level:  Did Not Attend  Channel Papandrea De'Chelle Zechariah Bissonnette 10/28/2015, 3:26 PM

## 2015-10-28 NOTE — Discharge Summary (Addendum)
Physician Discharge Summary Note  Patient:  Connie Fernandez is an 19 y.o., female MRN:  [id number] DOB:  09-15-1996 Patient phone:  [phone] (home)  Patient address:   [place] [address],  Total Time spent with patient: 30 minutes  Date of Admission:  10/28/2015 Date of Discharge: 10/29/15  Reason for Admission:  SI and panic attacks  Principal Problem: Major depressive disorder, single episode, moderate ([place]) Discharge Diagnoses: Patient Active Problem List   Diagnosis Date Noted  . Tobacco use disorder [F17.200] 10/28/2015  . Panic disorder [F41.0] 10/28/2015  . Major depressive disorder, single episode, moderate ([place]) [F32.1] 10/28/2015  . Pityriasis rosea [L42] 01/21/2015   History of Present Illness: Connie Fernandez is a 19 y.o. female patient presenting to the ED voluntarily with her mother on 12/21. Pt was referred to the ED by her PCP at [facility] for depression and anxiety and self-mutilation.   Urine toxicology screen was positive for benzodiazepines. 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 She reported that she has been having panic attacks over there and they have to send her back home. She reported that she is unable to finish her work in [employer]. Patient reported that she has been feeling progressively worse and has been feeling depressed hopeless helpless and is unable to sleep at night. She is only sleeping for 2 hours every day. Patient reported that she has been cutting herself and was having thoughts to kill herself on Wednesday. She took a razor blade and had several cuts on her abdomen. She called her girlfriend who was at work and then also called her mother. They took her to her primary care physician who decided that patient needs  admission due to worsening of her depressive symptoms.  Patient denies the use of any illicit substances or alcohol. She says she got 1 Xanax on the street and took it the day prior to admission.  She reported that she is not taking any other medications at this time. She has been living with her girlfriend since August of this year. She states they get along very well and denies having any issues in the relationship. Patient states her mother is very supportive and she plans to move in with her after discharge  She has several cuts on her abdomen and reported that they were initially bleeding when she was a razor blade to cut on her abdomen .  Pt reports she has a history of cutting since age 26 and does it to help cope with her symptoms of depression. Patient denies having a suicide plan but has had thoughts of killing herself. Patient denies any HI, auditory or visual hallucinations. Pt states she has a supportive family.   Associated Signs/Symptoms: Depression Symptoms: depressed mood, anhedonia, suicidal thoughts without plan, anxiety, panic attacks, (Hypo) Manic Symptoms: none Anxiety Symptoms: Panic Symptoms, Psychotic Symptoms: none PTSD Symptoms: NA   Total Time spent with patient: 1 hour  Past Psychiatric History: Patient reports seeing a psychiatrist but when she was 19 years old for depression. They discussed the possibility of putting her on medications back then but they didn't do it. She denies ever being hospitalized. She denies history of suicidal attempts. She has had self-injurious behaviors since age 46    Past Medical  History: Denies history of head trauma, seizures or chronic medical conditions Past Medical History  Diagnosis Date  . Dysmenorrhea in adolescent    History reviewed. No pertinent past surgical history.  Family History:  Family History  Problem Relation Age of Onset  . Diabetes Mother   . Alcohol abuse Father   .  Hypertension Father   . COPD Maternal Grandmother   . Early death Maternal Grandmother 12  . Heart disease Maternal Grandmother   . Heart disease Maternal Grandfather 72  . Parkinsonism Paternal Grandmother    Family Psychiatric History: Denies any family history of mental illness, substance abuse or suicides  Social History: Patient is currently living with her girlfriend since August of this year. However she plans to moving back with her mother and returned to school where she wants to pursue counseling. The patient is single, never married. She doesn't have any children. She graduated from high school. She denies any legal history History  Alcohol Use  . 0.0 oz/week  . 0 Standard drinks or equivalent per week    Comment: occasionally    History  Drug Use No    Social History   Social History  . Marital Status: Single    Spouse Name: N/A  . Number of Children: N/A  . Years of Education: N/A   Social History Main Topics  . Smoking status: Current Every Day Smoker -- 0.00 packs/day    Types: Cigarettes  . Smokeless tobacco: Never Used  . Alcohol Use: 0.0 oz/week    0 Standard drinks or equivalent per week     Comment: occasionally  . Drug Use: No  . Sexual Activity: Not Asked   Other Topics Concern  . None   Social History Narrative   Lives with mother, father, and 2 sisters    [employer] underwriter and works [place]    1 dog    Right handed    Enjoys working, hanging out with friends                  Hospital Course:   Patient is a 19 year old Caucasian female with prior history of depression and self injury who presented to our emergency department with worsening anxiety secondary to panic attacks.   MDD: patient has been started on sertraline 12.5 mg q day  Panic disorder: Patient is interested in a started treatment with  antidepressants..  Anxiety:will be discharge on ativan 0.25 mg tid prn  Insomnia: patient could use ativan as a prn for insomnia  Tobacco use disorder: pt was offered nicotine patch 21 mg as patient smokes one pack per day  Labs: TSH wnl, B12 is pending.  On discharge she was feeling improved, denied SI, HI or hallucinations.  There was no evidence of psychosis, mania or hypomania.  No unsafe behaviors while in the unit.  Cooperative and pleasant with staff.  No need for seclusion, restrains, or forced medications.  Spent with the patient's mother prior to discharge. She was educated about the diagnosis, and all the medications that were prescribed. She was also educated about discharge planning and follow-up instructions. She did not have any concerns about the patient's safety prior to discharge.   Musculoskeletal: Strength & Muscle Tone: within normal limits Gait & Station: normal Patient leans: N/A  Psychiatric Specialty Exam: Review of Systems  Constitutional: Negative.   HENT: Negative.   Eyes: Negative.   Respiratory: Negative.   Cardiovascular: Negative.   Gastrointestinal: Negative.   Genitourinary: Negative.  Musculoskeletal: Negative.   Skin: Negative.   Neurological: Negative.   Endo/Heme/Allergies: Negative.   Psychiatric/Behavioral: Negative.     Blood pressure 118/68, pulse 82, temperature 98.2 F (36.8 C), temperature source Oral, resp. rate 18, height 5' 2"  (1.575 m), weight 52.164 kg (115 lb), last menstrual period 10/19/2015, SpO2 100 %.Body mass index is 21.03 kg/(m^2).  General Appearance: Fairly Groomed  Engineer, water::  Good  Speech:  Clear and Coherent  Volume:  Normal  Mood:  Euthymic  Affect:  Appropriate and Congruent  Thought Process:  Linear  Orientation:  Full (Time, Place, and Person)  Thought Content:  Hallucinations: None  Suicidal Thoughts:  No  Homicidal Thoughts:  No  Memory:  Immediate;   Good Recent;   Good Remote;   Good   Judgement:  Fair  Insight:  Fair  Psychomotor Activity:  Normal  Concentration:  Good  Recall:  Good  Fund of Knowledge:Good  Language: Good  Akathisia:  No  Handed:  Right  AIMS (if indicated):     Assets:  Agricultural consultant Housing Physical Health Social Support Vocational/Educational  ADL's:  Intact  Cognition: WNL  Sleep:  Number of Hours: 6     Metabolic Disorder Labs:  No results found for: HGBA1C, MPG No results found for: PROLACTIN No results found for: CHOL, TRIG, HDL, CHOLHDL, VLDL, LDLCALC  Results for MACKENSI, MAHADEO (MRN [id number]) as of 10/28/2015 18:46  Ref. Range 10/26/2015 16:51 10/26/2015 23:17 10/28/2015 15:24  Sodium Latest Ref Range: 135-145 mmol/L 139    Potassium Latest Ref Range: 3.5-5.1 mmol/L 3.8    Chloride Latest Ref Range: 101-111 mmol/L 106    CO2 Latest Ref Range: 22-32 mmol/L 28    BUN Latest Ref Range: 6-20 mg/dL 11    Creatinine Latest Ref Range: 0.44-1.00 mg/dL 0.70    Calcium Latest Ref Range: 8.9-10.3 mg/dL 9.4    EGFR (Non-African Amer.) Latest Ref Range: >60 mL/min >60    EGFR (African American) Latest Ref Range: >60 mL/min >60    Glucose Latest Ref Range: 65-99 mg/dL 103 (H)    Anion gap Latest Ref Range: 5-15  5    Alkaline Phosphatase Latest Ref Range: 38-126 U/L 59    Albumin Latest Ref Range: 3.5-5.0 g/dL 5.3 (H)    AST Latest Ref Range: 15-41 U/L 18    ALT Latest Ref Range: 14-54 U/L 14    Total Protein Latest Ref Range: 6.5-8.1 g/dL 8.2 (H)    Total Bilirubin Latest Ref Range: 0.3-1.2 mg/dL 0.9    WBC Latest Ref Range: 3.6-11.0 K/uL 6.1    RBC Latest Ref Range: 3.80-5.20 MIL/uL 5.00    Hemoglobin Latest Ref Range: 12.0-16.0 g/dL 13.5    HCT Latest Ref Range: 35.0-47.0 % 41.9    MCV Latest Ref Range: 80.0-100.0 fL 83.7    MCH Latest Ref Range: 26.0-34.0 pg 27.1    MCHC Latest Ref Range: 32.0-36.0 g/dL 32.3    RDW Latest Ref Range: 11.5-14.5 % 15.9 (H)    Platelets Latest Ref  Range: 440-102 K/uL 725    Salicylate Lvl Latest Ref Range: 2.8-30.0 mg/dL <4.0    Acetaminophen Latest Ref Range: 10-30 ug/mL <10 (L)    Preg Test, Ur Latest Ref Range: NEGATIVE   NEGATIVE   TSH Latest Ref Range: 0.350-4.500 uIU/mL   0.436  Alcohol, Ethyl (B) Latest Ref Range: <5 mg/dL <5    Amphetamines, Ur Screen Latest Ref Range: NONE DETECTED  NONE DETECTED    Barbiturates, Ur  Screen Latest Ref Range: NONE DETECTED  NONE DETECTED    Benzodiazepine, Ur Scrn Latest Ref Range: NONE DETECTED  POSITIVE (A)    Cocaine Metabolite,Ur [place] Latest Ref Range: NONE DETECTED  NONE DETECTED    Methadone Scn, Ur Latest Ref Range: NONE DETECTED  NONE DETECTED    MDMA (Ecstasy)Ur Screen Latest Ref Range: NONE DETECTED  NONE DETECTED    Cannabinoid 50 Ng, Ur [place] Latest Ref Range: NONE DETECTED  NONE DETECTED    Opiate, Ur Screen Latest Ref Range: NONE DETECTED  NONE DETECTED    Phencyclidine (PCP) Ur S Latest Ref Range: NONE DETECTED  NONE DETECTED    Tricyclic, Ur Screen Latest Ref Range: NONE DETECTED  NONE DETECTED        Medication List    TAKE these medications      Indication   LORazepam 0.5 MG tablet  Commonly known as:  ATIVAN  Take 0.5 tablets (0.25 mg total) by mouth 3 (three) times daily as needed for anxiety or sleep.  Notes to Patient:  anxiety      sertraline 25 MG tablet  Commonly known as:  ZOLOFT  Take 0.5 tablets (12.5 mg total) by mouth daily.  Notes to Patient:  Panic attacks      traZODone 50 MG tablet  Commonly known as:  DESYREL  Take 1 tablet (50 mg total) by mouth at bedtime as needed for sleep.  Notes to Patient:  insomnia      TRI-PREVIFEM 0.18/0.215/0.25 MG-35 MCG tablet  Generic drug:  Norgestimate-Ethinyl Estradiol Triphasic  Take 1 tablet by mouth daily. Reported on 10/28/2015  Notes to Patient:  contraceptive        Follow-up Information    Follow up with [facility] .   Why:  Your hospital follow up appointment will be walk in. Walk  in hours are Monday through Friday between 8:00 am and 10:00am. Please take your ID and any insurance information.    Contact information:   [address]  [place], [place] [address] JMEQA:[phone] or [phone] [phone]     >30 minutes  Signed: Hildred Priest 10/29/2015, 10:53 AM

## 2015-10-28 NOTE — [facility] Suicide Risk Assessment (Signed)
[facility]   Demographic Factors:  Adolescent or young adult and Caucasian  Total Time spent with patient: 30 minutes   Psychiatric Specialty Exam: Physical Exam  ROS                                                         Have you used any form of tobacco in the last 30 days? (Cigarettes, Smokeless Tobacco, Cigars, and/or Pipes): Yes  Has this patient used any form of tobacco in the last 30 days? (Cigarettes, Smokeless Tobacco, Cigars, and/or Pipes) Yes, A prescription for an FDA-approved tobacco cessation medication was offered at discharge and the patient refused  Mental Status Per Nursing Assessment::   On Admission:     Current Mental Status by Physician: No longer voicing SI, HI or hallucinations.  No evidence of mania, hypomania, or psychosis.  Calm, pleasant and cooperative.  Denies hopelessness or helplessness.  Loss Factors: Financial problems/change in socioeconomic status  Historical Factors: Impulsivity  Risk Reduction Factors:   Sense of responsibility to family, Living with another person, especially a relative and Positive social support  Continued Clinical Symptoms:  Personality Disorders:   Cluster B Previous Psychiatric Diagnoses and Treatments  Cognitive Features That Contribute To Risk:  None    Suicide Risk:  Minimal: No identifiable suicidal ideation.  Patients presenting with no risk factors but with morbid ruminations; may be classified as minimal risk based on the severity of the depressive symptoms  Principal Problem: Major depressive disorder, single episode, moderate ([facility]) Discharge Diagnoses:  Patient Active Problem List   Diagnosis Date Noted  . Tobacco use disorder [F17.200] 10/28/2015  . Panic disorder [F41.0] 10/28/2015  . Major depressive disorder, single episode, moderate ([facility]) [F32.1] 10/28/2015  . Pityriasis rosea [L42] 01/21/2015       Is patient on multiple antipsychotic  therapies at discharge:  No   Has Patient had three or more failed trials of antipsychotic monotherapy by history:  No  Recommended Plan for Multiple Antipsychotic Therapies: NA    Jimmy FootmanHernandez-Gonzalez,  Connie Areola 10/29/2015, 8:17 AM

## 2015-10-28 NOTE — [facility] Suicide Risk Assessment (Signed)
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 Benzodiazepine abuse [F13.10] 10/26/2015  . Pityriasis rosea [L42] 01/21/2015     Continued Clinical Symptoms:  Alcohol Use Disorder Identification Test Final Score (AUDIT): 0 The "Alcohol Use Disorders Identification Test", Guidelines for Use in Primary Care, Second Edition.  World Science writerHealth Organization [facility](WHO). Score between 0-7:  no or low risk or alcohol related problems. Score between 8-15:  moderate risk of alcohol related problems. Score between 16-19:  high risk of alcohol related problems. Score 20 or above:  warrants further diagnostic evaluation for alcohol dependence and treatment.   CLINICAL FACTORS:   Severe Anxiety and/or Agitation Panic Attacks Depression:   Impulsivity Alcohol/Substance Abuse/Dependencies Personality Disorders:   Cluster B Comorbid depression   Psychiatric Specialty Exam: Physical Exam  ROS   COGNITIVE FEATURES THAT CONTRIBUTE TO RISK:  None    SUICIDE RISK:   Moderate:  Frequent suicidal ideation with limited intensity, and duration, some specificity in terms of plans, no associated intent, good self-control, limited dysphoria/symptomatology, some risk factors present, and identifiable protective factors, including available and accessible social support.  [facility]  Medical Decision Making:  Established Problem, Worsening (2)  I certify that inpatient services furnished can reasonably be expected to improve the patient's condition.   Jimmy FootmanHernandez-Gonzalez,   Connie Fernandez 10/28/2015, 2:09 PM

## 2015-10-28 NOTE — H&P (Signed)
Psychiatric Admission Assessment Adult  Patient Identification: Connie Fernandez MRN:  [id number] Date of Evaluation:  10/28/2015 Chief Complaint:  Panic Disorder without Agoraphobia Principal Diagnosis: Major depressive disorder, single episode, moderate ([place]) Diagnosis:   Patient Active Problem List   Diagnosis Date Noted  . Tobacco use disorder [F17.200] 10/28/2015  . Panic disorder [F41.0] 10/28/2015  . Major depressive disorder, single episode, moderate ([place]) [F32.1] 10/28/2015  . Pityriasis rosea [L42] 01/21/2015   History of Present Illness: Connie Fernandez is a 19 y.o. female patient presenting to the ED voluntarily with her mother on 12/21. Pt was referred to the ED by her PCP at [facility] for depression and anxiety and self-mutilation.   Urine toxicology screen was positive for benzodiazepines. 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 She reported that she has been having panic attacks over there and they have to send her back home. She reported that she is unable to finish her work in [employer]. Patient reported that she has been feeling progressively worse and has been feeling depressed hopeless helpless and is unable to sleep at night. She is only sleeping for 2 hours every day. Patient reported that she has been cutting herself and was having thoughts to kill herself on Wednesday. She took a razor blade and had several cuts on her abdomen. She called her girlfriend who was at work and then also called her mother. They took her to her primary care physician who decided that patient needs admission due to worsening of her depressive symptoms.  Patient denies the use of any illicit substances or alcohol. She says she got 1 Xanax on the street and took it the day prior to  admission.  She reported that she is not taking any other medications at this time. She has been living with her girlfriend since August of this year. She states they get along very well and denies having any issues in the relationship. Patient states her mother is very supportive and she plans to move in with her after discharge  She has several cuts on her abdomen and reported that they were initially bleeding when she was a razor blade to cut on her abdomen .  Pt reports she has a history of cutting since age 32 and does it to help cope with her symptoms of depression. Patient denies having a suicide plan but has had thoughts of killing herself. Patient denies any HI, auditory or visual hallucinations. Pt states she has a supportive family.   Associated Signs/Symptoms: Depression Symptoms:  depressed mood, anhedonia, suicidal thoughts without plan, anxiety, panic attacks, (Hypo) Manic Symptoms:  none Anxiety Symptoms:  Panic Symptoms, Psychotic Symptoms:  none PTSD Symptoms: NA   Total Time spent with patient: 1 hour  Past Psychiatric History: Patient reports seeing a psychiatrist but when she was 19 years old for depression. They discussed the possibility of putting her on medications back then but they didn't do it. She denies ever being hospitalized. She denies history of suicidal attempts. She has had self-injurious behaviors since age 40    Past Medical History: Denies history of head trauma, seizures or chronic medical conditions Past Medical History  Diagnosis Date  . Dysmenorrhea in adolescent    History reviewed. No pertinent past surgical history.  Family History:  Family History  Problem Relation Age of Onset  . Diabetes Mother   . Alcohol abuse Father   . Hypertension Father   . COPD Maternal Grandmother   . Early death Maternal Grandmother 64  . Heart disease Maternal Grandmother   . Heart disease Maternal Grandfather 72  . Parkinsonism Paternal  Grandmother    Family Psychiatric  History: Denies any family history of mental illness, substance abuse or suicides  Social History: Patient is currently living with her girlfriend since August of this year. However she plans to moving back with her mother and returned to school where she wants to pursue counseling. The patient is single, never married. She doesn't have any children. She graduated from high school. She denies any legal history History  Alcohol Use  . 0.0 oz/week  . 0 Standard drinks or equivalent per week    Comment: occasionally     History  Drug Use No    Social History   Social History  . Marital Status: Single    Spouse Name: N/A  . Number of Children: N/A  . Years of Education: N/A   Social History Main Topics  . Smoking status: Current Every Day Smoker -- 0.00 packs/day    Types: Cigarettes  . Smokeless tobacco: Never Used  . Alcohol Use: 0.0 oz/week    0 Standard drinks or equivalent per week     Comment: occasionally  . Drug Use: No  . Sexual Activity: Not Asked   Other Topics Concern  . None   Social History Narrative   Lives with mother, father, and 2 sisters    [employer] underwriter and works [place]    1 dog    Right handed    Enjoys working, Oceanographer out with friends             Allergies:   Allergies  Allergen Reactions  . Penicillins     Has patient had a PCN reaction causing immediate rash, facial/tongue/throat swelling, SOB or lightheadedness with hypotension: Dizziness and shaky. Has patient had a PCN reaction causing severe rash involving mucus membranes or skin necrosis: NO Has patient had a PCN reaction that required hospitalization: NO Has patient had a PCN reaction occurring within the last 10 years: NO If all of the above answers are "NO", then may proceed with Cephalosporin use.    Lab Results:  Results for orders placed or performed during the hospital encounter of 10/26/15 (from the past 48 hour(s))  Comprehensive  metabolic panel     Status: Abnormal   Collection Time: 10/26/15  4:51 PM  Result Value Ref Range   Sodium 139 135 - 145 mmol/L   Potassium 3.8 3.5 - 5.1 mmol/L   Chloride 106 101 - 111 mmol/L   CO2 28 22 - 32 mmol/L   Glucose, Bld 103 (H) 65 - 99 mg/dL   BUN 11 6 - 20 mg/dL   Creatinine, Ser 0.70 0.44 - 1.00 mg/dL   Calcium 9.4 8.9 - 10.3 mg/dL   Total Protein 8.2 (H) 6.5 - 8.1 g/dL   Albumin 5.3 (H) 3.5 - 5.0 g/dL   AST 18 15 - 41 U/L   ALT 14 14 - 54 U/L   Alkaline Phosphatase 59 38 - 126 U/L   Total Bilirubin 0.9 0.3 - 1.2 mg/dL   GFR calc non Af Amer >60 >60 mL/min   GFR calc Af Amer >60 >60 mL/min    Comment: (NOTE) The eGFR has been calculated using the CKD  EPI equation. This calculation has not been validated in all clinical situations. eGFR's persistently <60 mL/min signify possible Chronic Kidney Disease.    Anion gap 5 5 - 15  Ethanol (ETOH)     Status: None   Collection Time: 10/26/15  4:51 PM  Result Value Ref Range   Alcohol, Ethyl (B) <5 <5 mg/dL    Comment:        LOWEST DETECTABLE LIMIT FOR SERUM ALCOHOL IS 5 mg/dL FOR MEDICAL PURPOSES ONLY   Salicylate level     Status: None   Collection Time: 10/26/15  4:51 PM  Result Value Ref Range   Salicylate Lvl <2.7 2.8 - 30.0 mg/dL  Acetaminophen level     Status: Abnormal   Collection Time: 10/26/15  4:51 PM  Result Value Ref Range   Acetaminophen (Tylenol), Serum <10 (L) 10 - 30 ug/mL    Comment:        THERAPEUTIC CONCENTRATIONS VARY SIGNIFICANTLY. A RANGE OF 10-30 ug/mL MAY BE AN EFFECTIVE CONCENTRATION FOR MANY PATIENTS. HOWEVER, SOME ARE BEST TREATED AT CONCENTRATIONS OUTSIDE THIS RANGE. ACETAMINOPHEN CONCENTRATIONS >150 ug/mL AT 4 HOURS AFTER INGESTION AND >50 ug/mL AT 12 HOURS AFTER INGESTION ARE OFTEN ASSOCIATED WITH TOXIC REACTIONS.   CBC     Status: Abnormal   Collection Time: 10/26/15  4:51 PM  Result Value Ref Range   WBC 6.1 3.6 - 11.0 K/uL   RBC 5.00 3.80 - 5.20 MIL/uL    Hemoglobin 13.5 12.0 - 16.0 g/dL   HCT 41.9 35.0 - 47.0 %   MCV 83.7 80.0 - 100.0 fL   MCH 27.1 26.0 - 34.0 pg   MCHC 32.3 32.0 - 36.0 g/dL   RDW 15.9 (H) 11.5 - 14.5 %   Platelets 224 150 - 440 K/uL  Urine Drug Screen, Qualitative ([facility] only)     Status: Abnormal   Collection Time: 10/26/15  4:51 PM  Result Value Ref Range   Tricyclic, Ur Screen NONE DETECTED NONE DETECTED   Amphetamines, Ur Screen NONE DETECTED NONE DETECTED   MDMA (Ecstasy)Ur Screen NONE DETECTED NONE DETECTED   Cocaine Metabolite,Ur [place] NONE DETECTED NONE DETECTED   Opiate, Ur Screen NONE DETECTED NONE DETECTED   Phencyclidine (PCP) Ur S NONE DETECTED NONE DETECTED   Cannabinoid 50 Ng, Ur [place] NONE DETECTED NONE DETECTED   Barbiturates, Ur Screen NONE DETECTED NONE DETECTED   Benzodiazepine, Ur Scrn POSITIVE (A) NONE DETECTED   Methadone Scn, Ur NONE DETECTED NONE DETECTED    Comment: (NOTE) 253  Tricyclics, urine               Cutoff 1000 ng/mL 200  Amphetamines, urine             Cutoff 1000 ng/mL 300  MDMA (Ecstasy), urine           Cutoff 500 ng/mL 400  Cocaine Metabolite, urine       Cutoff 300 ng/mL 500  Opiate, urine                   Cutoff 300 ng/mL 600  Phencyclidine (PCP), urine      Cutoff 25 ng/mL 700  Cannabinoid, urine              Cutoff 50 ng/mL 800  Barbiturates, urine             Cutoff 200 ng/mL 900  Benzodiazepine, urine           Cutoff 200 ng/mL 1000 Methadone, urine  Cutoff 300 ng/mL 1100 1200 The urine drug screen provides only a preliminary, unconfirmed 1300 analytical test result and should not be used for non-medical 1400 purposes. Clinical consideration and professional judgment should 1500 be applied to any positive drug screen result due to possible 1600 interfering substances. A more specific alternate chemical method 1700 must be used in order to obtain a confirmed analytical result.  1800 Gas chromato graphy / mass spectrometry (GC/MS) is the preferred 1900  confirmatory method.   Pregnancy, urine POC     Status: None   Collection Time: 10/26/15 11:17 PM  Result Value Ref Range   Preg Test, Ur NEGATIVE NEGATIVE    Comment:        THE SENSITIVITY OF THIS METHODOLOGY IS >24 mIU/mL     Metabolic Disorder Labs:  No results found for: HGBA1C, MPG No results found for: PROLACTIN No results found for: CHOL, TRIG, HDL, CHOLHDL, VLDL, LDLCALC  Current Medications: Current Facility-Administered Medications  Medication Dose Route Frequency Provider Last Rate Last Dose  . acetaminophen (TYLENOL) tablet 650 mg  650 mg Oral Q6H PRN Hildred Priest, MD      . alum & mag hydroxide-simeth (MAALOX/MYLANTA) 200-200-20 MG/5ML suspension 30 mL  30 mL Oral Q4H PRN Hildred Priest, MD      . LORazepam (ATIVAN) tablet 0.25 mg  0.25 mg Oral TID PRN Hildred Priest, MD      . magnesium hydroxide (MILK OF MAGNESIA) suspension 30 mL  30 mL Oral Daily PRN Hildred Priest, MD      . nicotine (NICODERM CQ - dosed in mg/24 hours) patch 21 mg  21 mg Transdermal Daily Hildred Priest, MD   21 mg at 10/28/15 1423  . sertraline (ZOLOFT) tablet 12.5 mg  12.5 mg Oral Daily Hildred Priest, MD      . traZODone (DESYREL) tablet 50 mg  50 mg Oral QHS PRN Hildred Priest, MD       PTA Medications: Prescriptions prior to admission  Medication Sig Dispense Refill Last Dose  . TRI-PREVIFEM 0.18/0.215/0.25 MG-35 MCG tablet Take 1 tablet by mouth daily. Reported on 10/28/2015  6 Not Taking    Musculoskeletal: Strength & Muscle Tone: within normal limits Gait & Station: normal Patient leans: N/A  Psychiatric Specialty Exam: Physical Exam  Constitutional: She is oriented to person, place, and time. She appears well-developed and well-nourished.  HENT:  Head: Normocephalic and atraumatic.  Eyes: Conjunctivae and EOM are normal.  Neck: Normal range of motion.  Respiratory: Effort normal.  Musculoskeletal:  Normal range of motion.  Neurological: She is alert and oriented to person, place, and time.  Skin: Skin is warm and dry.    Review of Systems  Constitutional: Negative.   HENT: Negative.   Eyes: Negative.   Respiratory: Negative.   Cardiovascular: Negative.   Gastrointestinal: Negative.   Genitourinary: Negative.   Musculoskeletal: Negative.   Skin: Negative.   Neurological: Negative.   Endo/Heme/Allergies: Negative.   Psychiatric/Behavioral: Positive for depression. The patient is nervous/anxious and has insomnia.     Blood pressure 118/68, pulse 82, temperature 98.2 F (36.8 C), temperature source Oral, resp. rate 18, height 5' 2" (1.575 m), weight 52.164 kg (115 lb), last menstrual period 10/19/2015, SpO2 100 %.Body mass index is 21.03 kg/(m^2).  General Appearance: Fairly Groomed  Engineer, water::  Good  Speech:  Clear and Coherent  Volume:  Normal  Mood:  Anxious  Affect:  Congruent  Thought Process:  Linear  Orientation:  Full (Time, Place, and Person)  Thought Content:  Hallucinations: None  Suicidal Thoughts:  No  Homicidal Thoughts:  No  Memory:  Immediate;   Good Recent;   Good Remote;   Good  Judgement:  Fair  Insight:  Fair  Psychomotor Activity:  Normal  Concentration:  Good  Recall:  Good  Fund of Knowledge:Good  Language: Good  Akathisia:  No  Handed:    AIMS (if indicated):     Assets:  Agricultural consultant Housing Physical Health Social Support  ADL's:  Intact  Cognition: WNL  Sleep:  Number of Hours: 5     Treatment Plan Summary: Daily contact with patient to assess and evaluate symptoms and progress in treatment and Medication management   Patient is a 19 year old Caucasian female with prior history of depression and self injury who presented to our emergency department with worsening anxiety secondary to panic attacks.   MDD: will start low dose sertraline 12.5 mg q day  Panic disorder: Patient is  interested in a started treatment with antidepressants..  Anxiety: I will order a low dose of Ativan when necessary for panic attacks  Insomnia: I will order trazodone 50 mg daily at bedtime when necessary  Tobacco use disorder: I will order nicotine patch 21 mg as patient smokes one pack per day  Precautions every 15 minute checks  Diet regular.  Labs: will order V56 and TSH  I certify that inpatient services furnished can reasonably be expected to improve the patient's condition.   Hildred Priest 12/23/20162:47 PM

## 2015-10-28 NOTE — Tx Team (Signed)
Initial Interdisciplinary Treatment Plan   PATIENT STRESSORS: Loss of job   PATIENT STRENGTHS: Ability for insight Average or above average intelligence Communication skills Supportive family/friends Work skills   PROBLEM LIST: Problem List/Patient Goals Date to be addressed Date deferred Reason deferred Estimated date of resolution  Anxiety 10-28-15     Depression 10-28-15     Self injurious behaviors  10-28-15                                          DISCHARGE CRITERIA:  Ability to meet basic life and health needs Improved stabilization in mood, thinking, and/or behavior Motivation to continue treatment in a less acute level of care  PRELIMINARY DISCHARGE PLAN: Attend aftercare/continuing care group Return to previous living arrangement  PATIENT/FAMIILY INVOLVEMENT: This treatment plan has been presented to and reviewed with the patient, Connie Fernandez, and/or family member,   The patient and family have been given the opportunity to ask questions and make suggestions.  Connie Fernandez 10/28/2015, 2:50 AM

## 2015-10-28 NOTE — Plan of Care (Signed)
Problem: [facility] Participation in Recreation Therapeutic Interventions Goal: STG-Patient will demonstrate improved self esteem by identif STG: Self-Esteem - Within 3 treatment sessions, patient will verbalize at least 5 positive affirmation statements in one treatment session to increase self-esteem post d/c.  Outcome: Completed/Met Date Met:  10/28/15 Treatment Session 1; Completed 1 out of 1: At approximately 4:05 pm, LRT met with patient in craft room. Patient verbalized 5 positive affirmation statements. Patient reported it felt "weird". LRT encouraged patient to continue saying positive affirmation statements. Intervention Used: I Am statements  Leonette Monarch, LRT/CTRS 12.23.16 4:38 pm Goal: STG-Patient will demonstrate improved communication skills b STG: Communication - Within 3 treatment sessions, patient will participate in communication activity in one treatment session to increase communication skills post d/c.  Outcome: Completed/Met Date Met:  10/28/15 Treatment Session 1; Completed 1 out of 1: At approximately 4:05 pm, LRT met with patient in craft room. Patient was instructed to take as much toilet paper as she would use in the next 2 hours. For each square, patient was instructed to give a fun fact about herself. Patient able to give fun facts for each square with assistance from LRT. LRT educated patient on communication and trust.  Intervention Used: Toilet Paper Communication game  Leonette Monarch, LRT/CTRS 12.23.16 4:40 pm Goal: STG-Other Recreation Therapy Goal (Specify) STG: Stress Management - Within 3 treatment sessions, patient will verbalize understanding of the stress management techniques in one treatment session to increase stress management skills post d/c.  Outcome: Completed/Met Date Met:  10/28/15 Treatment Session 1; Completed 1 out of 1: At approximately 4:05 pm, LRT met with patient in craft room. LRT educated and provided patient with handouts on stress  management techniques. Patient verbalized understanding. LRT encouraged patient to read over and practice the stress management techniques. Intervention Used: Stress Management handouts  Leonette Monarch, LRT/CTRS 12.23.16 4:41 pm

## 2015-10-28 NOTE — Progress Notes (Signed)
Pt has been isolating in room, not going to groups, denies SI/HI/AVH, encouraged pt to go to groups and as day progressed and after patient got a roommate she has been more visible on the unit, encouraged pt to eat meals, pt was up in dayroom for dinner but did not eat even though tray was offered, 15 min checks continued for safety, flat/depressed during interaction, pleasant with staff, med compliant.

## 2015-10-28 NOTE — Progress Notes (Signed)
Recreation Therapy Notes  INPATIENT RECREATION THERAPY ASSESSMENT  Patient Details Name: Wyatt PortelaJessica Marin MRN: [id number] DOB: 1996/09/19 Today's Date: 10/28/2015  Patient Stressors: Work  Coping Skills:   Avoidance, Counselling psychologistelf-Injury, Exercise, Art/Dance, Talking, Music  Personal Challenges: Communication, Concentration, Decision-Making, Expressing Yourself, Problem-Solving, Self-Esteem/Confidence, Social Interaction, Stress Management, Trusting Others, Work Nutritional therapisterformance  Leisure Interests (2+):  Music - Listen, Art - Draw, Individual - Other (Comment) (Talk to people)  Awareness of Community Resources:  No  Community Resources:     Current Use:    If no, Barriers?:    Patient Strengths:  "I can't even do that"  Patient Identified Areas of Improvement:  Self-esteem, talking to people  Current Recreation Participation:  Working, basically nothing  Patient Goal for Hospitalization:  To work on self  [place]  County of Residence:  [place]   Current [place] (including self-harm):  No  Current HI:  No  Consent to Intern Participation: N/A   Jacquelynn CreeGreene,Mykeisha Dysert M, LRT/CTRS 10/28/2015, 1:17 PM

## 2015-10-29 LAB — VITAMIN B12: Vitamin B-12: 354 pg/mL (ref 180–914)

## 2015-10-29 MED ORDER — TRAZODONE HCL 50 MG PO TABS: 50.0000 mg | ORAL_TABLET | Freq: Every evening | ORAL | Status: DC | PRN

## 2015-10-29 NOTE — Progress Notes (Signed)
Pleasant and cooperative.  Denies SI, depression or AVH.  Verbalizes that she is excited to be going home today.  Plan is to continue therapy and find a job and get her life together.  Discharge instructions given.  Verbalized understanding.  Prescriptions and crisis card given.  Personal belongings returned.  Escorted off unit by this Clinical research associatewriter to waiting room to meet parents to travel home.

## 2015-10-29 NOTE — Plan of Care (Signed)
Problem: Ineffective individual coping Goal: STG: Patient will remain free from self harm Outcome: Progressing Patient denies any thoughts of self harm on shift.

## 2015-10-29 NOTE — BHH Counselor (Signed)
Adult Comprehensive Assessment  Patient ID: Connie Fernandez, female   DOB: September 16, 1996, 19 y.o.   MRN: [id number]  Information Source: Information source: Patient  Current Stressors:  Educational / Learning stressors: Pt would like to attend college.  Employment / Job issues: Pt reports she lost her job because she was admitted to the hospital.  Family Relationships: None reported.  Financial / Lack of resources (include bankruptcy): Limited income. Housing / Lack of housing: Pt wants to move back with her parents upon discharge.  Physical health (include injuries & life threatening diseases): None reported.  Social relationships: None reported  Substance abuse: Denies use.  Bereavement / Loss: None reported   Living/Environment/Situation:  Living Arrangements: Spouse/significant other Living conditions (as described by patient or guardian): Pt lives with her girlfriend.  How long has patient lived in current situation?: Since August 2016 What is atmosphere in current home: Comfortable  Family History:  Marital status: Other (comment) (Girlfriend of 5 months. ) Are you sexually active?: Yes What is your sexual orientation?: Homosexual  Has your sexual activity been affected by drugs, alcohol, medication, or emotional stress?: None reported  Does patient have children?: No  Childhood History:  By whom was/is the patient raised?: Both parents Description of patient's relationship with caregiver when they were a child: Close relationship with parents  Patient's description of current relationship with people who raised him/her: Close relationship with parents  How were you disciplined when you got in trouble as a child/adolescent?: None reported  Does patient have siblings?: Yes Number of Siblings: 2 Description of patient's current relationship with siblings: 2 older sisters; close relationship. Did patient suffer any verbal/emotional/physical/sexual abuse as a child?: No Did  patient suffer from severe childhood neglect?: No Has patient ever been sexually abused/assaulted/raped as an adolescent or adult?: No Was the patient ever a victim of a crime or a disaster?: No Witnessed domestic violence?: No Has patient been effected by domestic violence as an adult?: No  Education:  Highest grade of school patient has completed: 12th Currently a student?: No Learning disability?: No  Employment/Work Situation:   Employment situation: Unemployed Patient's job has been impacted by current illness: Yes Describe how patient's job has been impacted: 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  What is the longest time patient has a held a job?: 3 years  Where was the patient employed at that time?: [employer]  Has patient ever been in the [employer] and Companymilitary?: No Are There Guns or Other Weapons in Your Home?: No  Financial Resources:   Surveyor, quantityinancial resources: Support from parents / caregiver Does patient have a Lawyerrepresentative payee or guardian?: No  Alcohol/Substance Abuse:   What has been your use of drugs/alcohol within the last 12 months?: Denies use  Alcohol/Substance Abuse Treatment Hx: Denies past history Has alcohol/substance abuse ever caused legal problems?: No  Social Support System:   Conservation officer, natureatient's Community Support System: Good Describe Community Support System: Family, girlfriend  Type of faith/religion: NA How does patient's faith help to cope with current illness?: NA   Leisure/Recreation:   Leisure and Hobbies: cooking, listening to music, reading, shopping  Strengths/Needs:   What things does the patient do well?: cooking, singing In what areas does patient struggle / problems for patient: anxiety, stressful job.   Discharge Plan:   Does patient have access to transportation?: Yes Will patient be returning to same living situation after discharge?: No Plan for living situation after discharge: Pt is going to live  with her parents   Currently receiving community mental health services: No If no, would patient like referral for services when discharged?: Yes (What county[place]) Does patient have financial barriers related to discharge medications?: No  Summary/Recommendations:   Connie Fernandez is a 19 year old female who presented to [facility] with depression, anxiety and self harm. She states she cut herself on her stomach. The cuts were very superficial and required no medical assistance. She states she has never cut herself before. She reports increased anxiety and depression over the last 2 months. She associates this to her starting her third shift job 2 months ago. She states she lost this job upon admission to the hospital. She currently lives with her girlfriend in [place] but plans to move back with her parents upon discharge. She does not currently receive outpatient services but would like a referral.  Recommendations include; crisis stabilization, medication management, therapeutic milieu, and encourage group attendance and participation.   Connie Fernandez.MSW, [facility]  10/29/2015

## 2015-10-29 NOTE — BHH Group Notes (Signed)
BHH Group Notes:  (Nursing/MHT/Case Management/Adjunct)  Date:  10/29/2015  Time:  8:59 AM  Type of Therapy:  Psychoeducational Skills  Participation Level:  Did Not Attend    Evelene Roussin A [place] Hanlon 10/29/2015, 8:59 AM 

## 2015-10-29 NOTE — Progress Notes (Signed)
  [facility] :  Will you be returning to the same living situation after discharge:  Yes,  Home At discharge, do you have transportation home?: Yes,  parents  Do you have the ability to pay for your medications: Yes,  Insurance \  Release of information consent forms completed and in the chart;  Patient's signature needed at discharge.  Patient to Follow up at: Follow-up Information    [facility] .   Why:  Your hospital follow up appointment will be walk in. Walk in hours are Monday through Friday between 8:00 am and 10:00am. Please take your ID and any insurance information.    Contact information:   [address]  [place], [place] [address] Phone:[phone] or [phone] Fax:[phone]      Next level of care provider has access to [facility] Link:no  Safety Planning and Suicide Prevention discussed: Yes,  with patient and mother   Have you used any form of tobacco in the last 30 days? (Cigarettes, Smokeless Tobacco, Cigars, and/or Pipes): Yes  Has patient been referred to the Quitline?: Patient refused referral  Patient has been referred for addiction treatment: N/A  Rondall Allegraandace L Tashae Inda MSW, LCSWA  10/29/2015, 10:10 AM

## 2015-10-29 NOTE — Tx Team (Signed)
Interdisciplinary Treatment Plan Update (Adult)  Date:  10/29/2015 Time Reviewed:  10:06 AM  Progress in Treatment: Attending groups: No. Participating in groups:  No. Taking medication as prescribed:  Yes. Tolerating medication:  Yes. Family/Significant othe contact made:  Yes, individual(s) contacted:  mother  Patient understands diagnosis:  Yes. Discussing patient identified problems/goals with staff:  Yes. Medical problems stabilized or resolved:  Yes. Denies suicidal/homicidal ideation: Yes. Issues/concerns per patient self-inventory:  Yes. Other:  New problem(s) identified: No, Describe:  NA  Discharge Plan or Barriers: Pt plans to return home and follow up with outpatient.    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 She reported that she has been having panic attacks over there and they have to send her back home. She reported that she is unable to finish her work in [employer]. Patient reported that she has been feeling progressively worse and has been feeling depressed hopeless helpless and is unable to sleep at night. She is only sleeping for 2 hours every day. Patient reported that she has been cutting herself and was having thoughts to kill herself on Wednesday. She took a razor blade and had several cuts on her abdomen. She called her girlfriend who was at work and then also called her mother. They took her to her primary care physician who decided that patient needs admission due to worsening of her depressive symptoms.  Estimated length of stay: Pt will likely d/c today.   New goal(s): NA  Review of initial/current patient goals per problem list:   1.  Goal(s): Patient will participate in aftercare  plan * Met:  * Target date: at discharge * As evidenced by: Patient will participate within aftercare plan AEB aftercare provider and housing plan at discharge being identified.   2.  Goal (s): Patient will exhibit decreased depressive symptoms and suicidal ideations. * Met:  *  Target date: at discharge * As evidenced by: Patient will utilize self rating of depression at 3 or below and demonstrate decreased signs of depression or be deemed stable for discharge by MD.   3.  Goal(s): Patient will demonstrate decreased signs and symptoms of anxiety. * Met:  * Target date: at discharge * As evidenced by: Patient will utilize self rating of anxiety at 3 or below and demonstrated decreased signs of anxiety, or be deemed stable for discharge by MD   Attendees: Patient:  Connie Fernandez 12/24/201610:06 AM  Family:   12/24/201610:06 AM  Physician:  Dr. Jerilee Hoh   12/24/201610:06 AM  Nursing:   Elige Radon, RN  12/24/201610:06 AM  Case Manager:   12/24/201610:06 AM  Counselor:   12/24/201610:06 AM  Other:  Wray Kearns, [place] 12/24/201610:06 AM  Other:   12/24/201610:06 AM  Other:   12/24/201610:06 AM  Other:  12/24/201610:06 AM  Other:  12/24/201610:06 AM  Other:  12/24/201610:06 AM  Other:  12/24/201610:06 AM  Other:  12/24/201610:06 AM  Other:  12/24/201610:06 AM  Other:   12/24/201610:06 AM   Scribe for Treatment Team:   Wray Kearns, MSW, LCSWA  10/29/2015, 10:06 AM

## 2015-10-29 NOTE — BHH Suicide Risk Assessment (Signed)
BHH INPATIENT:  Family/Significant Other Suicide Prevention Education  Suicide Prevention Education:  Education Completed; Theodosia QuayLisa Holsonback (mother) [phone] has been identified by the patient as the family member/significant other with whom the patient will be residing, and identified as the person(s) who will aid the patient in the event of a mental health crisis (suicidal ideations/suicide attempt).  With written consent from the patient, the family member/significant other has been provided the following suicide prevention education, prior to the and/or following the discharge of the patient.  The suicide prevention education provided includes the following:  Suicide risk factors  Suicide prevention and interventions  National Suicide Hotline telephone number  [facility] assessment telephone number  [facility] Emergency Assistance 911  [facility] and/or Residential Mobile Crisis Unit telephone number  Request made of family/significant other to:  Remove weapons (e.g., guns, rifles, knives), all items previously/currently identified as safety concern.    Remove drugs/medications (over-the-counter, prescriptions, illicit drugs), all items previously/currently identified as a safety concern.  The family member/significant other verbalizes understanding of the suicide prevention education information provided.  The family member/significant other agrees to remove the items of safety concern listed above.  Deandra Gadson L Quirino Kakos MSW, LCSWA  10/29/2015, 9:45 AM

## 2015-10-31 NOTE — Progress Notes (Signed)
Recreation Therapy Notes  INPATIENT RECREATION TR PLAN  Patient Details Name: Hyun Reali MRN: [id number] DOB: [birth year] Today's Date: 10/31/2015  Rec Therapy Plan Is patient appropriate for Therapeutic Recreation?: Yes Treatment times per week: At least once a week TR Treatment/Interventions: 1:1 session, Group participation (Comment)  Discharge Criteria Pt will be discharged from therapy if:: Treatment goals are met, Discharged Treatment plan/goals/alternatives discussed and agreed upon by:: Patient/family  Discharge Summary Short term goals set: See Care Plan Short term goals met: Complete Progress toward goals comments: One-to-one attended One-to-one attended: Self-esteem, stress management, communication Reason goals not met: N/A Therapeutic equipment acquired: None Reason patient discharged from therapy: Discharge from hospital Pt/family agrees with progress & goals achieved: Yes Date patient discharged from therapy: 10/29/15   Leonette Monarch, LRT/CTRS 10/31/2015, 1:55 PM

## 2015-11-23 ENCOUNTER — Ambulatory Visit (INDEPENDENT_AMBULATORY_CARE_PROVIDER_SITE_OTHER): Payer: BLUE CROSS/BLUE SHIELD | Admitting: Family Medicine

## 2015-11-23 ENCOUNTER — Encounter: Payer: Self-pay | Admitting: Family Medicine

## 2015-11-23 DIAGNOSIS — M545 Low back pain, unspecified: Secondary | ICD-10-CM | POA: Insufficient documentation

## 2015-11-23 MED ORDER — MELOXICAM 15 MG PO TABS
15.0000 mg | ORAL_TABLET | Freq: Every day | ORAL | Status: DC
Start: 2015-11-23 — End: 2016-01-28

## 2015-11-23 NOTE — Progress Notes (Signed)
Subjective:  Patient ID: Connie Fernandez, female    DOB: 1996-03-08  Age: 20 y.o. MRN: [id number]  CC: Back pain  HPI:  20 year old female with a past history of major depressive disorder and panic disorder presents with complaints of back pain.  Back pain  Patient reports a one-month history of lower back pain.  Pain is located bilaterally.  She reports associated numbness and tingling of the legs.  Anesthesia or incontinence.  She has taken Tylenol with no improvement.  No known exacerbating or relieving factors.  No recent trauma, fall, injury.  Of note, patient is not currently taking her medications regarding depression and anxiety.  Social Hx   Social History   Social History  . Marital Status: Single    Spouse Name: N/A  . Number of Children: N/A  . Years of Education: N/A   Social History Main Topics  . Smoking status: Current Every Day Smoker -- 0.00 packs/day    Types: Cigarettes  . Smokeless tobacco: Never Used  . Alcohol Use: 0.0 oz/week    0 Standard drinks or equivalent per week     Comment: occasionally  . Drug Use: No  . Sexual Activity: Not Asked   Other Topics Concern  . None   Social History Narrative   Lives with mother, father, and 2 sisters    Western Film/video editor and works [employer]    1 dog    Right handed    Enjoys working, Chief of Staff out with friends            Review of Systems  Musculoskeletal: Positive for back pain.  Neurological:       Numbness/tingling of the lower extremities.   Objective:  BP 94/56 mmHg  Pulse 83  Temp(Src) 98.3 F (36.8 C) (Oral)  Resp 16  Ht  (1.575 m)  Wt 118 lb 12.8 oz (53.887 kg)  BMI 21.72 kg/m2  SpO2 99%  LMP 11/09/2015  BP/Weight 11/23/2015 10/28/2015 10/27/2015  Systolic BP 94 118 105  Diastolic BP 56 68 53  Wt. (Lbs) 118.8 115 -  BMI 21.72 21.03 -   Physical Exam  Constitutional: She is oriented to person, place, and time. She appears well-developed. No distress.    Musculoskeletal:  Low back - nontender to palpation. Negative straight leg raise.   Neurological: She is alert and oriented to person, place, and time.  Normal sensation of the lower extremities. Normal muscle strength of lower extremity. 2-3 + Patellar reflexes. 2+ achilles reflexes.   Psychiatric:  Flat affect but in better spirits than our last visit.  Vitals reviewed.   Lab Results  Component Value Date   WBC 6.1 10/26/2015   HGB 13.5 10/26/2015   HCT 41.9 10/26/2015   PLT 224 10/26/2015   GLUCOSE 103* 10/26/2015   ALT 14 10/26/2015   AST 18 10/26/2015   NA 139 10/26/2015   K 3.8 10/26/2015   CL 106 10/26/2015   CREATININE 0.70 10/26/2015   BUN 11 10/26/2015   CO2 28 10/26/2015   TSH 0.436 10/28/2015    Assessment & Plan:   Problem List Items Addressed This Visit    Low back pain    New problem. Unremarkable exam today. No red flags. It would be highly unlikely for patient have disc issues/significant underlying pathology of the lumbar spine given age. Possible psychogenic/somatic etiology given patient's comorbid mental illness. Nevertheless, obtaining x-ray for further evaluation. Treating with empiric meloxicam while awaiting results.      Relevant Medications  meloxicam (MOBIC) 15 MG tablet   Other Relevant Orders   DG Lumbar Spine Complete      Meds ordered this encounter  Medications  . meloxicam (MOBIC) 15 MG tablet    Sig: Take 1 tablet (15 mg total) by mouth daily.    Dispense:  30 tablet    Refill:  0    Follow-up: PRN  Everlene Other DO [facility]

## 2015-11-23 NOTE — Assessment & Plan Note (Signed)
New problem. Unremarkable exam today. No red flags. It would be highly unlikely for patient have disc issues/significant underlying pathology of the lumbar spine given age. Possible psychogenic/somatic etiology given patient's comorbid mental illness. Nevertheless, obtaining x-ray for further evaluation. Treating with empiric meloxicam while awaiting results.

## 2015-11-23 NOTE — Patient Instructions (Signed)
Take the meloxicam daily as needed.  We will call with the xray results.  Be sure to follow up with psychiatry  Take care  Dr. Adriana Simas

## 2015-11-23 NOTE — Progress Notes (Signed)
Pre visit review using our clinic review tool, if applicable. No additional management support is needed unless otherwise documented below in the visit note. 

## 2015-12-09 ENCOUNTER — Ambulatory Visit (INDEPENDENT_AMBULATORY_CARE_PROVIDER_SITE_OTHER)
Admission: RE | Admit: 2015-12-09 | Discharge: 2015-12-09 | Disposition: A | Discharge status: Home / Self Care | Payer: BLUE CROSS/BLUE SHIELD | Source: Ambulatory Visit | Attending: Family Medicine | Admitting: Family Medicine

## 2015-12-09 DIAGNOSIS — M545 Low back pain: Secondary | ICD-10-CM

## 2015-12-19 ENCOUNTER — Other Ambulatory Visit: Payer: Self-pay | Admitting: Family Medicine

## 2015-12-19 NOTE — Telephone Encounter (Signed)
Please advise? Refilled in 11/23/15

## 2016-01-27 ENCOUNTER — Telehealth: Payer: Self-pay | Admitting: Nurse Practitioner

## 2016-01-27 NOTE — Telephone Encounter (Signed)
Patient Name: Connie Fernandez  DOB: 03/01/1996    Initial Comment Caller states daughter had a lot of dizzy spells and headaches.    Nurse Assessment      Guidelines    Guideline Title Affirmed Question Affirmed Notes       Final Disposition User   Clinical Call Sherilyn CooterHenry, RN, Thurmond ButtsWade    Comments  Caller is not with her daughter at this time. She can be reached at [phone]. She asked if I could give her a few minutes so she can let her know that I will be calling.

## 2016-01-28 ENCOUNTER — Encounter: Payer: Self-pay | Admitting: Family Medicine

## 2016-01-28 ENCOUNTER — Ambulatory Visit (INDEPENDENT_AMBULATORY_CARE_PROVIDER_SITE_OTHER): Payer: BLUE CROSS/BLUE SHIELD | Admitting: Family Medicine

## 2016-01-28 ENCOUNTER — Ambulatory Visit: Payer: Self-pay | Admitting: Family Medicine

## 2016-01-28 VITALS — BP 118/66 | HR 90 | Temp 98.4°F | Wt 122.0 lb

## 2016-01-28 DIAGNOSIS — R42 Dizziness and giddiness: Secondary | ICD-10-CM | POA: Diagnosis not present

## 2016-01-28 DIAGNOSIS — R202 Paresthesia of skin: Secondary | ICD-10-CM | POA: Diagnosis not present

## 2016-01-28 DIAGNOSIS — R358 Other polyuria: Secondary | ICD-10-CM

## 2016-01-28 DIAGNOSIS — R631 Polydipsia: Secondary | ICD-10-CM

## 2016-01-28 DIAGNOSIS — R3589 Other polyuria: Secondary | ICD-10-CM

## 2016-01-28 DIAGNOSIS — G44229 Chronic tension-type headache, not intractable: Secondary | ICD-10-CM

## 2016-01-28 NOTE — Progress Notes (Signed)
Pre visit review using our clinic review tool, if applicable. No additional management support is needed unless otherwise documented below in the visit note. 

## 2016-01-28 NOTE — Progress Notes (Signed)
OFFICE VISIT  01/28/2016   CC:  Chief Complaint  Patient presents with  . Headache    describes headaches as pressure---x 2 months--pt has not taken any OTC meds  . Dizziness    while sitting or laying down pt reports she feels like "she is sinking in the bed"  . Cough   HPI:    Patient is a 20 y.o. Caucasian female who [facility]  She presents accompanied by her mom for numbness and dizziness.  Feels numbness all over-"all the time", but worse when trying to go to sleep at night.   Feels low back pain at this tiime as well.  This has been occurring since 10/2015.  She says it started after starting zoloft so she stopped zoloft.  She was rx'd a new med but didn't start it.  Sx's got a bit better off zoloft but still present and debilitating.  Her mom says she lays in bed all day most days. Describes continuous feeling of vertigo when she lies down at night as well as frequent vertigo during daytime.  Says head/body movements don't trigger the vertigo sensation.  Gets nausea and some mild stomach aching with the vertigo some of the time.  Denies veering to one side when walking. Tired all the time. Describes dysmenorrhea and excessive vag bleeding with menses (40 tampons in 5 days).  Periods are regularly occurring. +Polyuria, polydipsia, polyphagia.  No abnl wt loss.   HA's--retroorbital.  No hearing probs/tinnitus. Had a concussion around 01/2016.  These current sx's did not occur right after her concussion at all.  Mood: up and down but no persistent depression. +Chronic anxiety with hx of panic attacks. Everything is fine at home and in her personal life.  Diet is normal. She says there is no chance that she is pregnant.  Past Medical History  Diagnosis Date  . Dysmenorrhea in adolescent     No past surgical history on file.  MEDS: currently none  Allergies  Allergen Reactions  . Penicillins     Has patient had a PCN reaction causing immediate rash,  facial/tongue/throat swelling, SOB or lightheadedness with hypotension: Dizziness and shaky. Has patient had a PCN reaction causing severe rash involving mucus membranes or skin necrosis: NO Has patient had a PCN reaction that required hospitalization: NO Has patient had a PCN reaction occurring within the last 10 years: NO If all of the above answers are "NO", then may proceed with Cephalosporin use.     ROS As per HPI  PE: Blood pressure 118/66, pulse 90, temperature 98.4 F (36.9 C), temperature source Oral, weight 122 lb (55.339 kg), last menstrual period 01/06/2016, SpO2 98 %. Gen: Alert, well appearing.  Patient is oriented to person, place, time, and situation. ENT: Ears: EACs clear, normal epithelium.  TMs with good light reflex and landmarks bilaterally.  Eyes: no injection, icteris, swelling, or exudate.  EOMI, PERRLA. Nose: no drainage or turbinate edema/swelling.  No injection or focal lesion.  Mouth: lips without lesion/swelling.  Oral mucosa pink and moist.  Dentition intact and without obvious caries or gingival swelling.  Oropharynx without erythema, exudate, or swelling.  Neck - No masses or thyromegaly or limitation in range of motion CV: RRR, no m/r/g.   LUNGS: CTA bilat, nonlabored resps, good aeration in all lung fields. ABD: soft, NT/ND EXT: no clubbing, cyanosis, or edema.  Neuro: CN 2-12 intact bilaterally, strength 5/5 in proximal and distal upper extremities and lower extremities bilaterally.  No  sensory deficits.  No tremor.  No disdiadochokinesis.  No ataxia.  Upper extremity and lower extremity DTRs symmetric.  No pronator drift.  LABS:  None today  IMPRESSION AND PLAN:  Chronic (nearly continuous per pt) vertigo, headaches, and paresthesias. She looks well and has normal exam today. Discussed plan with pt/mom: neurologist referral. Also, ordered CBC, CMET, TSH, ESR, CRP for the future---to be done fasting at [facility]. No meds rx'd.  An After  Visit Summary was printed and given to the patient.  FOLLOW UP: Return for f/u with PMD as needed.  Signed:  Crissie Sickles, MD           01/28/2016

## 2016-02-03 ENCOUNTER — Other Ambulatory Visit: Payer: Self-pay

## 2016-02-03 ENCOUNTER — Other Ambulatory Visit (INDEPENDENT_AMBULATORY_CARE_PROVIDER_SITE_OTHER): Payer: BLUE CROSS/BLUE SHIELD

## 2016-02-03 DIAGNOSIS — R358 Other polyuria: Secondary | ICD-10-CM | POA: Diagnosis not present

## 2016-02-03 DIAGNOSIS — R631 Polydipsia: Secondary | ICD-10-CM | POA: Diagnosis not present

## 2016-02-03 DIAGNOSIS — G44229 Chronic tension-type headache, not intractable: Secondary | ICD-10-CM

## 2016-02-03 DIAGNOSIS — R202 Paresthesia of skin: Secondary | ICD-10-CM

## 2016-02-03 DIAGNOSIS — R42 Dizziness and giddiness: Secondary | ICD-10-CM

## 2016-02-03 DIAGNOSIS — R3589 Other polyuria: Secondary | ICD-10-CM

## 2016-02-03 LAB — CBC WITH DIFFERENTIAL/PLATELET
Basophils Absolute: 0.1 10*3/uL (ref 0.0–0.1)
Basophils Relative: 1.4 % (ref 0.0–3.0)
Eosinophils Absolute: 0.5 10*3/uL (ref 0.0–0.7)
Eosinophils Relative: 8.1 % — ABNORMAL HIGH (ref 0.0–5.0)
HCT: 36.1 % (ref 36.0–49.0)
Hemoglobin: 12.2 g/dL (ref 12.0–16.0)
Lymphocytes Relative: 40.5 % (ref 24.0–48.0)
Lymphs Abs: 2.6 10*3/uL (ref 0.7–4.0)
MCHC: 33.8 g/dL (ref 31.0–37.0)
MCV: 81.9 fl (ref 78.0–98.0)
Monocytes Absolute: 0.4 10*3/uL (ref 0.1–1.0)
Monocytes Relative: 6.8 % (ref 3.0–12.0)
Neutro Abs: 2.8 10*3/uL (ref 1.4–7.7)
Neutrophils Relative %: 43.2 % (ref 43.0–71.0)
Platelets: 218 10*3/uL (ref 150.0–575.0)
RBC: 4.4 Mil/uL (ref 3.80–5.70)
RDW: 16.5 % — ABNORMAL HIGH (ref 11.4–15.5)
WBC: 6.5 10*3/uL (ref 4.5–13.5)

## 2016-02-03 LAB — SEDIMENTATION RATE: Sed Rate: 6 mm/hr (ref 0–22)

## 2016-02-03 LAB — COMPREHENSIVE METABOLIC PANEL
ALT: 25 U/L (ref 0–35)
AST: 23 U/L (ref 0–37)
Albumin: 4.5 g/dL (ref 3.5–5.2)
Alkaline Phosphatase: 52 U/L (ref 47–119)
BUN: 14 mg/dL (ref 6–23)
CO2: 28 mEq/L (ref 19–32)
Calcium: 9.2 mg/dL (ref 8.4–10.5)
Chloride: 104 mEq/L (ref 96–112)
Creatinine, Ser: 0.82 mg/dL (ref 0.40–1.20)
GFR: 94.86 mL/min (ref >60.00)
Glucose, Bld: 93 mg/dL (ref 70–99)
Potassium: 3.7 mEq/L (ref 3.5–5.1)
Sodium: 139 mEq/L (ref 135–145)
Total Bilirubin: 0.3 mg/dL (ref 0.2–1.2)
Total Protein: 6.6 g/dL (ref 6.0–8.3)

## 2016-02-03 LAB — VITAMIN B12: Vitamin B-12: 401 pg/mL (ref 211–911)

## 2016-02-03 LAB — TSH: TSH: 1.71 u[IU]/mL (ref 0.40–5.00)

## 2016-02-03 LAB — C-REACTIVE PROTEIN: CRP: <0.1 mg/dL — ABNORMAL LOW (ref 0.5–20.0)

## 2016-02-06 ENCOUNTER — Encounter: Payer: Self-pay | Admitting: Nurse Practitioner

## 2016-02-06 ENCOUNTER — Ambulatory Visit (INDEPENDENT_AMBULATORY_CARE_PROVIDER_SITE_OTHER): Payer: BLUE CROSS/BLUE SHIELD | Admitting: Nurse Practitioner

## 2016-02-06 VITALS — BP 118/72 | HR 76 | Temp 97.8°F | Ht 62.0 in | Wt 122.5 lb

## 2016-02-06 DIAGNOSIS — F5089 Other specified eating disorder: Secondary | ICD-10-CM

## 2016-02-06 DIAGNOSIS — D721 Eosinophilia, unspecified: Secondary | ICD-10-CM

## 2016-02-06 MED ORDER — ONDANSETRON 4 MG PO TBDP: 4.0000 mg | ORAL_TABLET | Freq: Three times a day (TID) | ORAL | Status: DC | PRN

## 2016-02-06 MED ORDER — MECLIZINE HCL 32 MG PO TABS: 32.0000 mg | ORAL_TABLET | Freq: Three times a day (TID) | ORAL | Status: DC | PRN

## 2016-02-06 MED ORDER — POLYETHYLENE GLYCOL 3350 17 GM/SCOOP PO POWD: 17.0000 g | Freq: Every day | ORAL | Status: DC

## 2016-02-06 NOTE — Patient Instructions (Signed)
Try the zofran under your tongue before meals to help with nausea/vomiting.  Miralax for constipation.   Meclizine for dizziness.

## 2016-02-06 NOTE — Progress Notes (Signed)
Patient ID: Connie Fernandez, female    DOB: Jul 07, 1996  Age: 20 y.o. MRN: [id number]  CC: Acute Visit   HPI Connie PortelaJessica Pantaleo presents for CC of Vomiting after meals.   1) Seen in July for same concern  Not taking anything right now  Feeling like needs to throw up after meals   If vomiting occurs sometimes small-large amounts of food/liquid until bile she reports.  HA and dizziness intermittent  Pt is a very poor historian and her fiance is aiding her  Pt and fiance have a new puppy that has an unknown history and no vet visits thus far.    History Connie Fernandez has a past medical history of Dysmenorrhea in adolescent.   She has no past surgical history on file.   Her family history includes Alcohol abuse in her father; COPD in her maternal grandmother; Diabetes in her mother; Early death (age of onset: 7152) in her maternal grandmother; Heart disease in her maternal grandmother; Heart disease (age of onset: 6072) in her maternal grandfather; Hypertension in her father; Parkinsonism in her paternal grandmother.She reports that she has been smoking Cigarettes.  She has been smoking about 1.00 pack per day. She has never used smokeless tobacco. She reports that she drinks alcohol. She reports that she does not use illicit drugs.  No outpatient prescriptions prior to visit.   No facility-administered medications prior to visit.    ROS Review of Systems  Constitutional: Negative for fever, chills, diaphoresis and fatigue.  HENT: Negative for trouble swallowing and voice change.   Eyes: Negative for visual disturbance.  Respiratory: Negative for chest tightness, shortness of breath and wheezing.   Cardiovascular: Negative for chest pain, palpitations and leg swelling.  Gastrointestinal: Positive for nausea, vomiting and constipation. Negative for diarrhea.  Skin: Negative for rash.  Neurological: Positive for light-headedness and headaches. Negative for dizziness.  Psychiatric/Behavioral:  Positive for self-injury. Negative for suicidal ideas and sleep disturbance.       Well healed scars on abdomen h/o cutting    Objective:  BP 118/72 mmHg  Pulse 76  Temp(Src) 97.8 F (36.6 C) (Oral)  Ht 5\' 2"  (1.575 m)  Wt 122 lb 8 oz (55.566 kg)  BMI 22.40 kg/m2  SpO2 99%  LMP 01/06/2016  Physical Exam  Constitutional: She is oriented to person, place, and time. She appears well-developed and well-nourished. No distress.  HENT:  Head: Normocephalic and atraumatic.  Right Ear: External ear normal.  Left Ear: External ear normal.  Mouth/Throat: No oropharyngeal exudate.  TMs clear bilaterally  Eyes: EOM are normal. Pupils are equal, round, and reactive to light. Right eye exhibits no discharge. Left eye exhibits no discharge. No scleral icterus.  Cardiovascular: Normal rate, regular rhythm and normal heart sounds.  Exam reveals no gallop and no friction rub.   No murmur heard. Pulmonary/Chest: Effort normal and breath sounds normal. No respiratory distress. She has no wheezes. She has no rales. She exhibits no tenderness.  Abdominal: Soft. Bowel sounds are normal. She exhibits no distension and no mass. There is no tenderness. There is no rebound and no guarding.  Well healed scars from self cutting Denies CVA tenderness  Neurological: She is alert and oriented to person, place, and time. She exhibits normal muscle tone.  Skin: Skin is warm and dry. No rash noted. She is not diaphoretic.  Psychiatric: She has a normal mood and affect. Her behavior is normal. Judgment and thought content normal.   Assessment & Plan:   Connie Fernandez was  seen today for acute visit.  Diagnoses and all orders for this visit:  Eosinophilia -     Ova and parasite examination  Psychogenic vomiting with nausea  Other orders -     ondansetron (ZOFRAN ODT) 4 MG disintegrating tablet; Take 1 tablet (4 mg total) by mouth every 8 (eight) hours as needed for nausea or vomiting. -     meclizine (ANTIVERT) 32  MG tablet; Take 1 tablet (32 mg total) by mouth 3 (three) times daily as needed. -     polyethylene glycol powder (GLYCOLAX/MIRALAX) powder; Take 17 g by mouth daily.   I am having Ms. Lanphier start on ondansetron, meclizine, and polyethylene glycol powder.  Meds ordered this encounter  Medications  . ondansetron (ZOFRAN ODT) 4 MG disintegrating tablet    Sig: Take 1 tablet (4 mg total) by mouth every 8 (eight) hours as needed for nausea or vomiting.    Dispense:  20 tablet    Refill:  0    Order Specific Question:  Supervising Provider    Answer:  Duncan Dull L [[id number]]  . meclizine (ANTIVERT) 32 MG tablet    Sig: Take 1 tablet (32 mg total) by mouth 3 (three) times daily as needed.    Dispense:  30 tablet    Refill:  0    Order Specific Question:  Supervising Provider    Answer:  Duncan Dull L [[id number]]  . polyethylene glycol powder (GLYCOLAX/MIRALAX) powder    Sig: Take 17 g by mouth daily.    Dispense:  250 g    Refill:  1    Order Specific Question:  Supervising Provider    Answer:  Sherlene Shams [[id number]]     Follow-up: Return in about 2 weeks (around 02/20/2016) for Follow up.

## 2016-02-07 ENCOUNTER — Telehealth: Payer: Self-pay | Admitting: Nurse Practitioner

## 2016-02-07 NOTE — Telephone Encounter (Signed)
Patient was prescribed Melclizine, can we change the dose, the pharmacies highest dose is 25mg ? thanks

## 2016-02-08 NOTE — Telephone Encounter (Signed)
Spoke with Pharmacist at [facility] to clarify order.  Order given for 25mg  , 1 tablet three times a day. Thanks

## 2016-02-08 NOTE — Telephone Encounter (Signed)
Yes, sorry - our computer has weird dosages that aren't there- is there someone who can fix this in the Epic world?  25 mg is fine. Thanks!

## 2016-02-13 DIAGNOSIS — D721 Eosinophilia, unspecified: Secondary | ICD-10-CM | POA: Insufficient documentation

## 2016-02-13 DIAGNOSIS — R112 Nausea with vomiting, unspecified: Secondary | ICD-10-CM | POA: Insufficient documentation

## 2016-02-13 NOTE — Assessment & Plan Note (Signed)
N/V believed to be psychogenic in nature. Pt is not taking anything to help OTC. Trying Zofran and antivert for dizziness. Checking O&P due to new puppy and elevated eosinophils.  Normal labs

## 2016-02-13 NOTE — Assessment & Plan Note (Signed)
Elevated eosinophil count- likely allergies, could be esophagus, or parasites from new puppy. Checking O&P, stool kit given  Miralax given to aide in elimination

## 2016-03-09 ENCOUNTER — Emergency Department
Admission: EM | Admit: 2016-03-09 | Discharge: 2016-03-09 | Disposition: A | Discharge status: Home / Self Care | Payer: BLUE CROSS/BLUE SHIELD | Attending: Emergency Medicine | Admitting: Emergency Medicine

## 2016-03-09 DIAGNOSIS — J302 Other seasonal allergic rhinitis: Secondary | ICD-10-CM | POA: Diagnosis not present

## 2016-03-09 DIAGNOSIS — F17210 Nicotine dependence, cigarettes, uncomplicated: Secondary | ICD-10-CM | POA: Diagnosis not present

## 2016-03-09 DIAGNOSIS — K0889 Other specified disorders of teeth and supporting structures: Secondary | ICD-10-CM | POA: Diagnosis present

## 2016-03-09 MED ORDER — CETIRIZINE HCL 10 MG PO CAPS: 10.0000 mg | ORAL_CAPSULE | Freq: Every day | ORAL | Status: DC

## 2016-03-09 MED ORDER — ACETAMINOPHEN-CODEINE #3 300-30 MG PO TABS: 1.0000 | ORAL_TABLET | ORAL | Status: DC | PRN

## 2016-03-09 MED ORDER — CLINDAMYCIN HCL 300 MG PO CAPS: 300.0000 mg | ORAL_CAPSULE | Freq: Three times a day (TID) | ORAL | Status: DC

## 2016-03-09 NOTE — ED Provider Notes (Signed)
[facility] Emergency Department Provider Note  ____________________________________________  Time seen: Approximately 6:37 PM  I have reviewed the triage vital signs and the nursing notes.   HISTORY  Chief Complaint Dental Pain; Sore Throat; Generalized Body Aches; and Headache   HPI Connie PortelaJessica Olander is a 20 y.o. female who presents to the emergency department for evaluation of dental pain, sore throat, nasal congestion, and ear pain since yesterday. She has taken tylenol without relief. No chronic respiratory disease or other chronic illness.    Past Medical History  Diagnosis Date  . Dysmenorrhea in adolescent     Patient Active Problem List   Diagnosis Date Noted  . Eosinophilia 02/13/2016  . Nausea with vomiting 02/13/2016  . Low back pain 11/23/2015  . Tobacco use disorder 10/28/2015  . Panic disorder 10/28/2015  . Major depressive disorder, single episode, moderate (HCC) 10/28/2015    History reviewed. No pertinent past surgical history.  Current Outpatient Rx  Name  Route  Sig  Dispense  Refill  . acetaminophen-codeine (TYLENOL #3) 300-30 MG tablet   Oral   Take 1-2 tablets by mouth every 4 (four) hours as needed for moderate pain.   12 tablet   0   . Cetirizine HCl 10 MG CAPS   Oral   Take 1 capsule (10 mg total) by mouth daily.   30 capsule   3   . clindamycin (CLEOCIN) 300 MG capsule   Oral   Take 1 capsule (300 mg total) by mouth 3 (three) times daily.   30 capsule   0   . meclizine (ANTIVERT) 32 MG tablet   Oral   Take 1 tablet (32 mg total) by mouth 3 (three) times daily as needed.   30 tablet   0   . ondansetron (ZOFRAN ODT) 4 MG disintegrating tablet   Oral   Take 1 tablet (4 mg total) by mouth every 8 (eight) hours as needed for nausea or vomiting.   20 tablet   0   . polyethylene glycol powder (GLYCOLAX/MIRALAX) powder   Oral   Take 17 g by mouth daily.   250 g   1      Allergies Penicillins  Family History  Problem Relation Age of Onset  . Diabetes Mother   . Alcohol abuse Father   . Hypertension Father   . COPD Maternal Grandmother   . Early death Maternal Grandmother 552  . Heart disease Maternal Grandmother   . Heart disease Maternal Grandfather 72  . Parkinsonism Paternal Grandmother     Social History Social History  Substance Use Topics  . Smoking status: Current Every Day Smoker -- 1.00 packs/day    Types: Cigarettes  . Smokeless tobacco: Never Used  . Alcohol Use: 0.0 oz/week    0 Standard drinks or equivalent per week     Comment: occasionally    Review of Systems Constitutional: Negative for fever/chills ENT: Positive for sore throat. Cardiovascular: Denies chest pain. Respiratory: Negative for shortness of breath. Positive for cough. Gastrointestinal: Negative for nausea,  no vomiting.  No diarrhea.  Musculoskeletal: Positive for body aches Skin: Negative for rash. Neurological: Positive for headaches ____________________________________________   PHYSICAL EXAM:  VITAL SIGNS: ED Triage Vitals  Enc Vitals Group     BP 03/09/16 1651 108/65 mmHg     Pulse Rate 03/09/16 1650 88     Resp 03/09/16 1650 18     Temp 03/09/16 1650 98.1 F (36.7 C)     Temp Source  03/09/16 1650 Oral     SpO2 03/09/16 1650 100 %     Weight 03/09/16 1650 123 lb (55.792 kg)     Height 03/09/16 1650  (1.575 m)     Head Cir --      Peak Flow --      Pain Score 03/09/16 1651 8     Pain Loc --      Pain Edu? --      Excl. in GC? --     Constitutional: Alert and oriented. Acutely ill appearing and in no acute distress. Eyes: Conjunctivae are normal. EOMI. Ears: Bilateral TM with serous fluid Nose: Sinus congestion without tenderness; clear rhinnorhea. Mouth/Throat: Mucous membranes are moist.  Oropharynx erythematous. Tonsils appear 2+ without exudate. Dental caries noted in right lower without obvious dental abscess. Neck: No  stridor.  Lymphatic: No cervical lymphadenopathy. Cardiovascular: Normal rate, regular rhythm. Grossly normal heart sounds.  Good peripheral circulation. Respiratory: Normal respiratory effort.  No retractions. Lungs clear to auscultation. Gastrointestinal: Soft and nontender.  Musculoskeletal: FROM x 4 extremities.  Neurologic:  Normal speech and language.  Skin:  Skin is warm, dry and intact. No rash noted. Psychiatric: Mood and affect are normal. Speech and behavior are normal.  ____________________________________________   LABS (all labs ordered are listed, but only abnormal results are displayed)  Labs Reviewed - No data to display ____________________________________________  EKG   ____________________________________________  RADIOLOGY   ____________________________________________   PROCEDURES  Procedure(s) performed:   Critical Care performed:   ____________________________________________   INITIAL IMPRESSION / ASSESSMENT AND PLAN / ED COURSE  Pertinent labs & imaging results that were available during my care of the patient were reviewed by me and considered in my medical decision making (see chart for details).  Prescriptions for cetirizine, amoxicillin, and tylenol 3 given today. She was advised to follow up with the PCP of her choice for symptoms not improving over the week. She was also advised to return to the emergency department for symptoms that change or worsen if unable to schedule an appointment.  ____________________________________________   FINAL CLINICAL IMPRESSION(S) / ED DIAGNOSES  Final diagnoses:  Seasonal allergies  Pain, dental      Chinita Pester, FNP 03/09/16 1913  Minna Antis, MD 03/09/16 2004

## 2016-03-09 NOTE — ED Notes (Signed)
PT c/o sore throat, body aches, toothache, headache that started last night. Pt alert and oriented X4, active, cooperative, pt in NAD. RR even and unlabored, color WNL.

## 2016-03-09 NOTE — Discharge Instructions (Signed)
Allergic Rhinitis Allergic rhinitis is when the mucous membranes in the nose respond to allergens. Allergens are particles in the air that cause your body to have an allergic reaction. This causes you to release allergic antibodies. Through a chain of events, these eventually cause you to release histamine into the blood stream. Although meant to protect the body, it is this release of histamine that causes your discomfort, such as frequent sneezing, congestion, and an itchy, runny nose.  CAUSES Seasonal allergic rhinitis (hay fever) is caused by pollen allergens that may come from grasses, trees, and weeds. Year-round allergic rhinitis (perennial allergic rhinitis) is caused by allergens such as house dust mites, pet dander, and mold spores. SYMPTOMS  Nasal stuffiness (congestion).  Itchy, runny nose with sneezing and tearing of the eyes. DIAGNOSIS Your health care provider can help you determine the allergen or allergens that trigger your symptoms. If you and your health care provider are unable to determine the allergen, skin or blood testing may be used. Your health care provider will diagnose your condition after taking your health history and performing a physical exam. Your health care provider may assess you for other related conditions, such as asthma, pink eye, or an ear infection. TREATMENT Allergic rhinitis does not have a cure, but it can be controlled by:  Medicines that block allergy symptoms. These may include allergy shots, nasal sprays, and oral antihistamines.  Avoiding the allergen. Hay fever may often be treated with antihistamines in pill or nasal spray forms. Antihistamines block the effects of histamine. There are over-the-counter medicines that may help with nasal congestion and swelling around the eyes. Check with your health care provider before taking or giving this medicine. If avoiding the allergen or the medicine prescribed do not work, there are many new medicines  your health care provider can prescribe. Stronger medicine may be used if initial measures are ineffective. Desensitizing injections can be used if medicine and avoidance does not work. Desensitization is when a patient is given ongoing shots until the body becomes less sensitive to the allergen. Make sure you follow up with your health care provider if problems continue. HOME CARE INSTRUCTIONS It is not possible to completely avoid allergens, but you can reduce your symptoms by taking steps to limit your exposure to them. It helps to know exactly what you are allergic to so that you can avoid your specific triggers. SEEK MEDICAL CARE IF:  You have a fever.  You develop a cough that does not stop easily (persistent).  You have shortness of breath.  You start wheezing.  Symptoms interfere with normal daily activities.   This information is not intended to replace advice given to you by your health care provider. Make sure you discuss any questions you have with your health care provider.   Document Released: 07/17/2001 Document Revised: 11/12/2014 Document Reviewed: 06/29/2013 Elsevier Interactive Patient Education 2016 [employer]  

## 2016-03-09 NOTE — ED Notes (Signed)
Pt c/o pain in right lower jaw, throat pain, cough, nasal/ear congestion and generalized body pain. Pt also reports thick yellow nasal drainage, and productive cough with thick yellow sputum.

## 2016-05-09 ENCOUNTER — Ambulatory Visit: Payer: BLUE CROSS/BLUE SHIELD | Admitting: Obstetrics and Gynecology

## 2016-05-17 ENCOUNTER — Encounter: Payer: Self-pay | Admitting: Obstetrics & Gynecology

## 2016-05-17 ENCOUNTER — Ambulatory Visit (INDEPENDENT_AMBULATORY_CARE_PROVIDER_SITE_OTHER): Payer: BLUE CROSS/BLUE SHIELD | Admitting: Obstetrics & Gynecology

## 2016-05-17 VITALS — BP 109/71 | HR 83 | Ht 62.0 in | Wt 125.0 lb

## 2016-05-17 DIAGNOSIS — R102 Pelvic and perineal pain: Secondary | ICD-10-CM

## 2016-05-17 DIAGNOSIS — N946 Dysmenorrhea, unspecified: Secondary | ICD-10-CM | POA: Diagnosis not present

## 2016-05-17 MED ORDER — TRAMADOL HCL 50 MG PO TABS: 50.0000 mg | ORAL_TABLET | Freq: Four times a day (QID) | ORAL | Status: DC | PRN

## 2016-05-17 NOTE — Progress Notes (Signed)
   Subjective:    Patient ID: Wyatt PortelaJessica Kotlyar, female    DOB: May 05, 1996, 20 y.o.   MRN: [id number]  HPI  20 yo engaged WP0 here today because she thinks she thinks she has endometriosis. She reports moodiness about a week prior to and during her period. She also compains of pain in her vagina prior to and during her periods. She says that her cramps with her period are so bad that she will end of on the floor crying. She has radiation to her lower back. Also has headaches with her periods. She has tried OTC pain meds with no help.  She was prescribed OCPs (tried at least 3 brands), also has tried depo provera (3 shots) and Nexplanon (4-5 months).+  She also suffers with anxiety. She goes to see Ronna PolioJennifer Walker, [facility] at [facility] and has also seen a counselor.  Review of Systems She had the Gardasil series per her mom. Same sex relationship, monogamous for a year. Penetration is involved. Penetration hurts, sometimes she cries. Her mom has endometriosis. She reports a negative screen at [place] earlier this year for CT/GC    Objective:   Physical Exam [id number] Breathing, conversing, and ambulating normally Abd- benign       Assessment & Plan:  Chronic pelvic pain- failed standard treatment for endometriosis Plan for diagnostic laparoscopy

## 2016-05-17 NOTE — Progress Notes (Signed)
Here today, thinks she has endometriosis.  Severe cramping week prior to period, moodiness.  Has tried several BC in past (depo/ pills/ nexplanon) did not like these.  Currently not using BC at all.

## 2016-05-18 ENCOUNTER — Encounter (HOSPITAL_COMMUNITY): Payer: Self-pay | Admitting: *Deleted

## 2016-05-28 ENCOUNTER — Other Ambulatory Visit (HOSPITAL_COMMUNITY): Payer: Self-pay

## 2016-05-31 ENCOUNTER — Emergency Department
Admission: EM | Admit: 2016-05-31 | Discharge: 2016-05-31 | Discharge status: Against Medical Advice | Payer: BLUE CROSS/BLUE SHIELD | Attending: Emergency Medicine | Admitting: Emergency Medicine

## 2016-05-31 ENCOUNTER — Encounter: Payer: Self-pay | Admitting: Emergency Medicine

## 2016-05-31 ENCOUNTER — Emergency Department: Payer: BLUE CROSS/BLUE SHIELD

## 2016-05-31 ENCOUNTER — Telehealth: Payer: Self-pay | Admitting: Emergency Medicine

## 2016-05-31 DIAGNOSIS — N390 Urinary tract infection, site not specified: Secondary | ICD-10-CM | POA: Diagnosis not present

## 2016-05-31 DIAGNOSIS — F17210 Nicotine dependence, cigarettes, uncomplicated: Secondary | ICD-10-CM | POA: Insufficient documentation

## 2016-05-31 DIAGNOSIS — R52 Pain, unspecified: Secondary | ICD-10-CM

## 2016-05-31 DIAGNOSIS — R102 Pelvic and perineal pain: Secondary | ICD-10-CM | POA: Diagnosis present

## 2016-05-31 HISTORY — DX: Endometriosis, unspecified: N80.9

## 2016-05-31 LAB — URINALYSIS COMPLETE WITH MICROSCOPIC (ARMC ONLY)
Bilirubin Urine: NEGATIVE
Glucose, UA: 50 mg/dL — AB
Nitrite: NEGATIVE
Protein, ur: 100 mg/dL — AB
Specific Gravity, Urine: 1.032 — ABNORMAL HIGH (ref 1.005–1.030)
pH: 6 (ref 5.0–8.0)

## 2016-05-31 LAB — CBC
HCT: 39.4 % (ref 35.0–47.0)
Hemoglobin: 13.5 g/dL (ref 12.0–16.0)
MCH: 29 pg (ref 26.0–34.0)
MCHC: 34.4 g/dL (ref 32.0–36.0)
MCV: 84.5 fL (ref 80.0–100.0)
Platelets: 225 10*3/uL (ref 150–440)
RBC: 4.66 MIL/uL (ref 3.80–5.20)
RDW: 15.3 % — ABNORMAL HIGH (ref 11.5–14.5)
WBC: 9.5 10*3/uL (ref 3.6–11.0)

## 2016-05-31 LAB — COMPREHENSIVE METABOLIC PANEL
ALT: 12 U/L — ABNORMAL LOW (ref 14–54)
AST: 19 U/L (ref 15–41)
Albumin: 4.8 g/dL (ref 3.5–5.0)
Alkaline Phosphatase: 49 U/L (ref 38–126)
Anion gap: 8 (ref 5–15)
BUN: 10 mg/dL (ref 6–20)
CO2: 24 mmol/L (ref 22–32)
Calcium: 9.2 mg/dL (ref 8.9–10.3)
Chloride: 108 mmol/L (ref 101–111)
Creatinine, Ser: 0.9 mg/dL (ref 0.44–1.00)
GFR calc Af Amer: >60 mL/min (ref >60)
GFR calc non Af Amer: >60 mL/min (ref >60)
Glucose, Bld: 101 mg/dL — ABNORMAL HIGH (ref 65–99)
Potassium: 3.8 mmol/L (ref 3.5–5.1)
Sodium: 140 mmol/L (ref 135–145)
Total Bilirubin: 0.5 mg/dL (ref 0.3–1.2)
Total Protein: 7.5 g/dL (ref 6.5–8.1)

## 2016-05-31 LAB — POCT PREGNANCY, URINE: Preg Test, Ur: NEGATIVE

## 2016-05-31 LAB — LIPASE, BLOOD: Lipase: 28 U/L (ref 11–51)

## 2016-05-31 MED ORDER — HYDROCODONE-ACETAMINOPHEN 5-325 MG PO TABS
1.0000 | ORAL_TABLET | Freq: Once | ORAL | Status: AC
  Administered 2016-05-31: 1 via ORAL
  Filled 2016-05-31: qty 1

## 2016-05-31 MED ORDER — IBUPROFEN 600 MG PO TABS
600.0000 mg | ORAL_TABLET | Freq: Once | ORAL | Status: AC
  Administered 2016-05-31: 600 mg via ORAL
  Filled 2016-05-31: qty 1

## 2016-05-31 NOTE — ED Notes (Signed)
Asked pt about urine specimen. Pt stated she toileted while in [place]. Pt made aware that all we are waiting on is her urine sample. Pt drinking water to encourage voiding.

## 2016-05-31 NOTE — Telephone Encounter (Addendum)
Called patient at request of dr sung.  Would like to inform patient that her urine test resulted after she had left and dr sung feels the patient has a UTI in need of antibiotics. She would like septra ds twice daily for 7 days called in to patient preferred pharmacy.  I have called the patient about 920 this am--the person who answered said the patient was not with her, but she agreed to try to contact patient as soon as possible and took my number so the pt can call me back.     7/[id number]--pt just called me back.  Called med to [facility]

## 2016-05-31 NOTE — ED Notes (Addendum)
Neither pt nor friend are in room. Dr. Dolores Frame was made aware at 0608. Dr. Dolores Frame stated to check one more time in a few minutes for pt.

## 2016-05-31 NOTE — ED Notes (Addendum)
Pt presents to ED for "cramps. I get them every month when I'm on my period. I'm going for surgery in August" Pt states lower back pain as well, currently on period. States N&V and diarrhea. Denies fever. PT appears in no distress at this time, skin warm and dry. States pain began yesterday.   Made aware again of need for urine sample. States she does not need to go.

## 2016-05-31 NOTE — ED Provider Notes (Signed)
[facility] Emergency Department Provider Note   ____________________________________________   First MD Initiated Contact with Patient 05/31/16 [phone]     (approximate)  I have reviewed the triage vital signs and the nursing notes.   HISTORY  Chief Complaint Abdominal Pain    HPI Connie Fernandez is a 20 y.o. female who presents to the ED from home with a chief complaint of pelvic and lower back pain. Patient reports she is to have surgery in 2 weeks for endometriosis. Also reports her last pelvic ultrasound and pelvic exam was over one year ago. Complains of sharp pelvic pain associated with 1 episode of emesis and 2 episodes of diarrhea. Also having some vaginal spotting. Denies associated fever, chills, chest pain, shortness of breath, dysuria. Denies recent travel or trauma. Nothing makes her symptoms better or worse.   Past Medical History:  Diagnosis Date  . Dysmenorrhea in adolescent   . Endometriosis     Patient Active Problem List   Diagnosis Date Noted  . Eosinophilia 02/13/2016  . Nausea with vomiting 02/13/2016  . Low back pain 11/23/2015  . Tobacco use disorder 10/28/2015  . Panic disorder 10/28/2015  . Major depressive disorder, single episode, moderate (HCC) 10/28/2015    History reviewed. No pertinent surgical history.  Prior to Admission medications   Medication Sig Start Date End Date Taking? Authorizing Provider  ibuprofen (ADVIL,MOTRIN) 200 MG tablet Take 400 mg by mouth every 6 (six) hours as needed for mild pain.    Historical Provider, MD  traMADol (ULTRAM) 50 MG tablet Take 1 tablet (50 mg total) by mouth every 6 (six) hours as needed. 05/17/16   Allie Bossier, MD    Allergies Penicillins  Family History  Problem Relation Age of Onset  . Diabetes Mother   . Alcohol abuse Father   . Hypertension Father   . COPD Maternal Grandmother   . Early death Maternal Grandmother 23  . Heart disease Maternal Grandmother   .  Heart disease Maternal Grandfather 72  . Parkinsonism Paternal Grandmother     Social History Social History  Substance Use Topics  . Smoking status: Current Every Day Smoker    Packs/day: 1.00    Types: Cigarettes  . Smokeless tobacco: Never Used  . Alcohol use No    Review of Systems  Constitutional: No fever/chills. Eyes: No visual changes. ENT: No sore throat. Cardiovascular: Denies chest pain. Respiratory: Denies shortness of breath. Gastrointestinal: Positive for pelvic pain.  Positive for vomiting and diarrhea.  No constipation. Genitourinary: Negative for dysuria. Musculoskeletal: Negative for back pain. Skin: Negative for rash. Neurological: Negative for headaches, focal weakness or numbness.  10-point ROS otherwise negative.  ____________________________________________   PHYSICAL EXAM:  VITAL SIGNS: ED Triage Vitals  Enc Vitals Group     BP 05/31/16 0052 126/67     Pulse Rate 05/31/16 0052 73     Resp 05/31/16 0052 18     Temp 05/31/16 0052 98.1 F (36.7 C)     Temp Source 05/31/16 0052 Oral     SpO2 05/31/16 0052 98 %     Weight 05/31/16 0052 135 lb (61.2 kg)     Height 05/31/16 0052 5\' 2"  (1.575 m)     Head Circumference --      Peak Flow --      Pain Score 05/31/16 0053 10     Pain Loc --      Pain Edu? --      Excl. in GC? --  Constitutional: Alert and oriented. Well appearing and in no acute distress. Eyes: Conjunctivae are normal. PERRL. EOMI. Head: Atraumatic. Nose: No congestion/rhinnorhea. Mouth/Throat: Mucous membranes are moist.  Oropharynx non-erythematous. Neck: No stridor.   Cardiovascular: Normal rate, regular rhythm. Grossly normal heart sounds.  Good peripheral circulation. Respiratory: Normal respiratory effort.  No retractions. Lungs CTAB. Gastrointestinal: Soft and nontender to light or deep palpation. No distention. No abdominal bruits. No CVA tenderness. Musculoskeletal: No lower extremity tenderness nor edema.  No  joint effusions. Neurologic:  Normal speech and language. No gross focal neurologic deficits are appreciated. No gait instability. Skin:  Skin is warm, dry and intact. No rash noted. Psychiatric: Mood and affect are normal. Speech and behavior are normal.  ____________________________________________   LABS (all labs ordered are listed, but only abnormal results are displayed)  Labs Reviewed  COMPREHENSIVE METABOLIC PANEL - Abnormal; Notable for the following:       Result Value   Glucose, Bld 101 (*)    ALT 12 (*)    All other components within normal limits  CBC - Abnormal; Notable for the following:    RDW 15.3 (*)    All other components within normal limits  URINALYSIS COMPLETEWITH MICROSCOPIC ([facility] ONLY) - Abnormal; Notable for the following:    Color, Urine RED (*)    APPearance CLOUDY (*)    Glucose, UA 50 (*)    Ketones, ur 1+ (*)    Specific Gravity, Urine 1.032 (*)    Hgb urine dipstick 3+ (*)    Protein, ur 100 (*)    Leukocytes, UA TRACE (*)    Bacteria, UA MANY (*)    Squamous Epithelial / LPF TOO NUMEROUS TO COUNT (*)    All other components within normal limits  LIPASE, BLOOD  POC URINE PREG, ED  POCT PREGNANCY, URINE   ____________________________________________  EKG  None ____________________________________________  RADIOLOGY  Pelvic ultrasound interpreted per Dr. Phill Myron: 1. Trace free pelvic fluid, presumably physiologic. 2. Otherwise negative pelvic ultrasound. Normal appearance of the uterus and ovaries. No evidence for torsion. ____________________________________________   PROCEDURES  Procedure(s) performed:   Pelvic exam: External exam WNL without rashes, lesions or vesicles. Speculum exam reveals mild vaginal bleeding; cervical os closed. Bimanual exam WNL.  Procedures  Critical Care performed: No  ____________________________________________   INITIAL IMPRESSION / ASSESSMENT AND PLAN / ED COURSE  Pertinent labs &  imaging results that were available during my care of the patient were reviewed by me and considered in my medical decision making (see chart for details).  20 year old female with a history of endometriosis who presents with pelvic pain radiating to her back. Laboratory results are unremarkable. Awaiting urinalysis. Will administer analgesia and send for pelvic ultrasound.  Clinical Course  Comment By Time  Updated patient of ultrasound results. She has an appointment with her GYN today for preoperative lab work. Strict return precautions given. Patient verbalizes understanding and agrees with plan of care. Irean Hong, MD 07/27 0507  Awaiting urine specimen. Patient drinking fluids. Irean Hong, MD 07/27 [phone]  Urine specimen was found on the counter. Patient and her friend are not in the treatment room. Will check in the lobby for patient. Irean Hong, MD 07/27 [phone]  Patient did not return to treatment room nor was she in the lobby. It appears that patient her friend had left emergency department. Results of urinalysis received. Will have the ED navigator Marisue Ivan call the patient to inform her of UTI and call in  a prescription for Septra DS twice daily 7 days. Irean Hong, MD 07/27 [phone]     ____________________________________________   FINAL CLINICAL IMPRESSION(S) / ED DIAGNOSES  Final diagnoses:  Pelvic pain in female  Pain  UTI (lower urinary tract infection)      NEW MEDICATIONS STARTED DURING THIS VISIT:  New Prescriptions   No medications on file     Note:  This document was prepared using Dragon voice recognition software and may include unintentional dictation errors.    Irean Hong, MD 05/31/16 [phone]

## 2016-05-31 NOTE — ED Notes (Signed)
Went into pt room to collect urine, had told her I would check on her in a few minutes trying to see if pt would urinate. Pt had urine sitting on counter, but pt and friend were not in room. Belongings were not in room. Sue Lush, RN called out front to see if pt had gone outside. No response. Pt still not returned back to room.

## 2016-05-31 NOTE — Patient Instructions (Addendum)
   Day of Surgery  Your procedure is scheduled on:  Tuesday, 8/8  Enter through the Main Entrance at :  9:30  Am  Pick up desk phone and dial [phone] and inform us of your arrival.  Please call [phone] if you have any problems the morning of surgery.  Remember:  Do not eat food or drink liquids, including water, after midnight  Monday  You may brush your teeth the morning of surgery.  Take these meds the morning of surgery with a sip of water:  DO NOT wear jewelry, eye make-up, lipstick,body lotion, or dark fingernail polish.  (Polished toes are ok)  You may wear deodorant.  If you are to be admitted after surgery, leave suitcase in car until your room has been assigned. Patients discharged on the day of surgery will not be allowed to drive home. Wear loose fitting, comfortable clothes for your ride home.

## 2016-05-31 NOTE — ED Notes (Signed)
Attempted to medicate pt but she was already in [place].

## 2016-05-31 NOTE — ED Triage Notes (Addendum)
Pt ambulatory to triage with steady gait with c/o lower abdominal pain and lower back pain beginning tonight. Pt reports is to have surgery on 8/11 for endometriosis. Pt also c/o one episode of vomiting and 2 episodes of diarrhea. Pt denies urinary symptoms. Pt denies blood in urine or emesis.

## 2016-05-31 NOTE — ED Notes (Signed)
Pt reports unable to provide urine specimen at this time, reports voiding prior to arrival to hospital. Pt provided with urine cup and informed to notify RN when able to obtain sample. Pt verbally acknowledged.

## 2016-06-01 ENCOUNTER — Encounter (HOSPITAL_COMMUNITY): Payer: Self-pay

## 2016-06-01 ENCOUNTER — Inpatient Hospital Stay (HOSPITAL_COMMUNITY)
Admission: RE | Admit: 2016-06-01 | Discharge: 2016-06-01 | Disposition: A | Discharge status: Home / Self Care | Payer: Self-pay | Source: Ambulatory Visit

## 2016-06-05 ENCOUNTER — Encounter (HOSPITAL_COMMUNITY)
Admission: RE | Admit: 2016-06-05 | Discharge: 2016-06-05 | Disposition: A | Discharge status: Home / Self Care | Payer: BLUE CROSS/BLUE SHIELD | Source: Ambulatory Visit | Attending: Obstetrics & Gynecology | Admitting: Obstetrics & Gynecology

## 2016-06-05 ENCOUNTER — Encounter (HOSPITAL_COMMUNITY): Payer: Self-pay

## 2016-06-05 DIAGNOSIS — Z01812 Encounter for preprocedural laboratory examination: Secondary | ICD-10-CM | POA: Insufficient documentation

## 2016-06-05 HISTORY — DX: Major depressive disorder, single episode, unspecified: F32.9

## 2016-06-05 HISTORY — DX: Reserved for inherently not codable concepts without codable children: IMO0001

## 2016-06-05 HISTORY — DX: Urinary tract infection, site not specified: N39.0

## 2016-06-05 HISTORY — DX: Headache: R51

## 2016-06-05 HISTORY — DX: Headache, unspecified: R51.9

## 2016-06-05 HISTORY — DX: Anxiety disorder, unspecified: F41.9

## 2016-06-05 HISTORY — DX: Depression, unspecified: F32.A

## 2016-06-05 HISTORY — DX: Palpitations: R00.2

## 2016-06-05 HISTORY — DX: Anemia, unspecified: D64.9

## 2016-06-05 LAB — CBC
HCT: 37.4 % (ref 36.0–46.0)
Hemoglobin: 12.8 g/dL (ref 12.0–15.0)
MCH: 28.8 pg (ref 26.0–34.0)
MCHC: 34.2 g/dL (ref 30.0–36.0)
MCV: 84.2 fL (ref 78.0–100.0)
Platelets: 213 10*3/uL (ref 150–400)
RBC: 4.44 MIL/uL (ref 3.87–5.11)
RDW: 15 % (ref 11.5–15.5)
WBC: 6.1 10*3/uL (ref 4.0–10.5)

## 2016-06-05 NOTE — Patient Instructions (Addendum)
Your procedure is scheduled on:  Tuesday, June 12, 2016  Enter through the [employer] [facility] at:  9:30 AM  Pick up the phone at the desk and dial [phone].  Call this number if you have problems the morning of surgery: [phone].  Remember: Do NOT eat food or drink after:  Midnight Monday   Take these medicines the morning of surgery with a SIP OF WATER:  None  Do NOT wear jewelry (body piercing), metal hair clips/bobby pins, make-up, or nail polish. Do NOT wear lotions, powders, or perfumes.  You may wear deodorant. Do NOT shave for 48 hours prior to surgery. Do NOT bring valuables to the hospital. Contacts, dentures, or bridgework may not be worn into surgery.  Have a responsible adult drive you home and stay with you for 24 hours after your procedure

## 2016-06-12 ENCOUNTER — Ambulatory Visit (HOSPITAL_COMMUNITY): Payer: BLUE CROSS/BLUE SHIELD | Admitting: Anesthesiology

## 2016-06-12 ENCOUNTER — Ambulatory Visit (HOSPITAL_COMMUNITY)
Admission: RE | Admit: 2016-06-12 | Discharge: 2016-06-12 | Disposition: A | Discharge status: Home / Self Care | Payer: BLUE CROSS/BLUE SHIELD | Source: Ambulatory Visit | Attending: Obstetrics & Gynecology | Admitting: Obstetrics & Gynecology

## 2016-06-12 ENCOUNTER — Encounter (HOSPITAL_COMMUNITY)
Admission: RE | Disposition: A | Discharge status: Home / Self Care | Payer: Self-pay | Source: Ambulatory Visit | Attending: Obstetrics & Gynecology

## 2016-06-12 ENCOUNTER — Encounter (HOSPITAL_COMMUNITY): Payer: Self-pay

## 2016-06-12 DIAGNOSIS — R102 Pelvic and perineal pain: Secondary | ICD-10-CM | POA: Diagnosis not present

## 2016-06-12 DIAGNOSIS — F17210 Nicotine dependence, cigarettes, uncomplicated: Secondary | ICD-10-CM | POA: Insufficient documentation

## 2016-06-12 DIAGNOSIS — Z880 Allergy status to penicillin: Secondary | ICD-10-CM | POA: Insufficient documentation

## 2016-06-12 DIAGNOSIS — F419 Anxiety disorder, unspecified: Secondary | ICD-10-CM | POA: Diagnosis not present

## 2016-06-12 DIAGNOSIS — N946 Dysmenorrhea, unspecified: Secondary | ICD-10-CM | POA: Insufficient documentation

## 2016-06-12 DIAGNOSIS — G8929 Other chronic pain: Secondary | ICD-10-CM | POA: Insufficient documentation

## 2016-06-12 HISTORY — PX: LAPAROSCOPY: SHX197

## 2016-06-12 LAB — PREGNANCY, URINE: Preg Test, Ur: NEGATIVE

## 2016-06-12 SURGERY — LAPAROSCOPY, DIAGNOSTIC
Anesthesia: General | Site: Abdomen

## 2016-06-12 MED ORDER — DEXAMETHASONE SODIUM PHOSPHATE 4 MG/ML IJ SOLN
INTRAMUSCULAR | Status: DC | PRN
  Administered 2016-06-12: 10 mg via INTRAVENOUS

## 2016-06-12 MED ORDER — LACTATED RINGERS IV SOLN
INTRAVENOUS | Status: DC
  Administered 2016-06-12 (×3): via INTRAVENOUS

## 2016-06-12 MED ORDER — OXYCODONE-ACETAMINOPHEN 5-325 MG PO TABS
1.0000 | ORAL_TABLET | Freq: Four times a day (QID) | ORAL | 0 refills | Status: DC | PRN
Start: 2016-06-12 — End: 2016-10-25

## 2016-06-12 MED ORDER — FENTANYL CITRATE (PF) 100 MCG/2ML IJ SOLN
INTRAMUSCULAR | Status: DC | PRN
  Administered 2016-06-12: 150 ug via INTRAVENOUS

## 2016-06-12 MED ORDER — MIDAZOLAM HCL 5 MG/5ML IJ SOLN
INTRAMUSCULAR | Status: DC | PRN
  Administered 2016-06-12: 2 mg via INTRAVENOUS

## 2016-06-12 MED ORDER — DEXAMETHASONE SODIUM PHOSPHATE 10 MG/ML IJ SOLN
INTRAMUSCULAR | Status: AC
  Filled 2016-06-12: qty 1

## 2016-06-12 MED ORDER — ONDANSETRON HCL 4 MG/2ML IJ SOLN
INTRAMUSCULAR | Status: AC
  Filled 2016-06-12: qty 2

## 2016-06-12 MED ORDER — ONDANSETRON HCL 4 MG/2ML IJ SOLN: 4.0000 mg | Freq: Once | INTRAMUSCULAR | Status: DC | PRN

## 2016-06-12 MED ORDER — NEOSTIGMINE METHYLSULFATE 10 MG/10ML IV SOLN
INTRAVENOUS | Status: DC | PRN
  Administered 2016-06-12: 4 mg via INTRAVENOUS
  Administered 2016-06-12: 1 mg via INTRAVENOUS

## 2016-06-12 MED ORDER — BUPIVACAINE HCL (PF) 0.5 % IJ SOLN
INTRAMUSCULAR | Status: AC
  Filled 2016-06-12: qty 30

## 2016-06-12 MED ORDER — PROPOFOL 500 MG/50ML IV EMUL: INTRAVENOUS | Status: DC | PRN

## 2016-06-12 MED ORDER — FENTANYL CITRATE (PF) 250 MCG/5ML IJ SOLN
INTRAMUSCULAR | Status: AC
  Filled 2016-06-12: qty 5

## 2016-06-12 MED ORDER — ONDANSETRON HCL 4 MG/2ML IJ SOLN
INTRAMUSCULAR | Status: DC | PRN
  Administered 2016-06-12: 4 mg via INTRAVENOUS

## 2016-06-12 MED ORDER — LIDOCAINE 2% (20 MG/ML) 5 ML SYRINGE
INTRAMUSCULAR | Status: DC | PRN
  Administered 2016-06-12: 100 mg via INTRAVENOUS

## 2016-06-12 MED ORDER — MEPERIDINE HCL 25 MG/ML IJ SOLN: 6.2500 mg | INTRAMUSCULAR | Status: DC | PRN

## 2016-06-12 MED ORDER — SCOPOLAMINE 1 MG/3DAYS TD PT72
MEDICATED_PATCH | TRANSDERMAL | Status: AC
  Administered 2016-06-12: 1.5 mg via TRANSDERMAL
  Filled 2016-06-12: qty 1

## 2016-06-12 MED ORDER — HYDROMORPHONE HCL 1 MG/ML IJ SOLN: 0.2500 mg | INTRAMUSCULAR | Status: DC | PRN

## 2016-06-12 MED ORDER — SCOPOLAMINE 1 MG/3DAYS TD PT72
1.0000 | MEDICATED_PATCH | Freq: Once | TRANSDERMAL | Status: DC
  Administered 2016-06-12: 1.5 mg via TRANSDERMAL

## 2016-06-12 MED ORDER — LIDOCAINE HCL (CARDIAC) 20 MG/ML IV SOLN
INTRAVENOUS | Status: AC
  Filled 2016-06-12: qty 5

## 2016-06-12 MED ORDER — PROPOFOL 10 MG/ML IV BOLUS
INTRAVENOUS | Status: AC
  Filled 2016-06-12: qty 20

## 2016-06-12 MED ORDER — MIDAZOLAM HCL 2 MG/2ML IJ SOLN
INTRAMUSCULAR | Status: AC
  Filled 2016-06-12: qty 2

## 2016-06-12 MED ORDER — BUPIVACAINE HCL (PF) 0.5 % IJ SOLN
INTRAMUSCULAR | Status: DC | PRN
  Administered 2016-06-12: 4 mL

## 2016-06-12 MED ORDER — GLYCOPYRROLATE 0.2 MG/ML IJ SOLN
INTRAMUSCULAR | Status: DC | PRN
  Administered 2016-06-12: 0.2 mg via INTRAVENOUS
  Administered 2016-06-12: .8 mg via INTRAVENOUS

## 2016-06-12 MED ORDER — ROCURONIUM BROMIDE 100 MG/10ML IV SOLN
INTRAVENOUS | Status: DC | PRN
  Administered 2016-06-12: 50 mg via INTRAVENOUS

## 2016-06-12 SURGICAL SUPPLY — 35 items
APPLICATOR COTTON TIP 6IN STRL (MISCELLANEOUS) IMPLANT
CABLE HIGH FREQUENCY MONO STRZ (ELECTRODE) IMPLANT
CATH ROBINSON RED A/P 16FR (CATHETERS) IMPLANT
CATH SILICONE 16FRX5CC (CATHETERS) ×3 IMPLANT
CLOSURE WOUND 1/2 X4 (GAUZE/BANDAGES/DRESSINGS)
CLOTH BEACON ORANGE TIMEOUT ST (SAFETY) ×3 IMPLANT
DRSG COVADERM PLUS 2X2 (GAUZE/BANDAGES/DRESSINGS) IMPLANT
DRSG OPSITE POSTOP 3X4 (GAUZE/BANDAGES/DRESSINGS) ×3 IMPLANT
DURAPREP 26ML APPLICATOR (WOUND CARE) ×3 IMPLANT
ELECT REM PT RETURN 9FT ADLT (ELECTROSURGICAL)
ELECTRODE REM PT RTRN 9FT ADLT (ELECTROSURGICAL) IMPLANT
GLOVE BIO SURGEON STRL SZ 6.5 (GLOVE) ×2 IMPLANT
GLOVE BIO SURGEONS STRL SZ 6.5 (GLOVE) ×1
GLOVE BIOGEL PI IND STRL 7.0 (GLOVE) ×3 IMPLANT
GLOVE BIOGEL PI INDICATOR 7.0 (GLOVE) ×6
GOWN STRL REUS W/TWL LRG LVL3 (GOWN DISPOSABLE) ×9 IMPLANT
NDL SAFETY ECLIPSE 18X1.5 (NEEDLE) ×1 IMPLANT
NEEDLE HYPO 18GX1.5 SHARP (NEEDLE) ×2
NEEDLE INSUFFLATION 120MM (ENDOMECHANICALS) ×3 IMPLANT
NS IRRIG 1000ML POUR BTL (IV SOLUTION) ×3 IMPLANT
PACK LAPAROSCOPY BASIN (CUSTOM PROCEDURE TRAY) ×3 IMPLANT
PAD TRENDELENBURG POSITION (MISCELLANEOUS) ×3 IMPLANT
POUCH SPECIMEN RETRIEVAL 10MM (ENDOMECHANICALS) IMPLANT
SET IRRIG TUBING LAPAROSCOPIC (IRRIGATION / IRRIGATOR) IMPLANT
SHEARS HARMONIC ACE PLUS 36CM (ENDOMECHANICALS) IMPLANT
SLEEVE XCEL OPT CAN 5 100 (ENDOMECHANICALS) IMPLANT
STRIP CLOSURE SKIN 1/2X4 (GAUZE/BANDAGES/DRESSINGS) IMPLANT
SUT VICRYL 0 UR6 27IN ABS (SUTURE) ×3 IMPLANT
SUT VICRYL 4-0 PS2 18IN ABS (SUTURE) ×3 IMPLANT
TOWEL OR 17X24 6PK STRL BLUE (TOWEL DISPOSABLE) ×6 IMPLANT
TROCAR BLADELESS OPT 5 100 (ENDOMECHANICALS) ×3 IMPLANT
TROCAR OPTI TIP 5M 100M (ENDOMECHANICALS) ×3 IMPLANT
TROCAR XCEL NON-BLD 11X100MML (ENDOMECHANICALS) IMPLANT
WARMER LAPAROSCOPE (MISCELLANEOUS) ×3 IMPLANT
WATER STERILE IRR 1000ML POUR (IV SOLUTION) IMPLANT

## 2016-06-12 NOTE — Op Note (Signed)
06/12/2016  11:59 AM  PATIENT:  Connie Fernandez  20 y.o. female  PRE-OPERATIVE DIAGNOSIS:  CHRONIC PELVIC PAIN  POST-OPERATIVE DIAGNOSIS:  CHRONIC PELVIC PAIN  PROCEDURE:  Procedure(s): LAPAROSCOPY DIAGNOSTIC (N/A)  SURGEON:  Surgeon(s) and Role:    * Allie BossierMyra C Maximillian Habibi, MD - Primary    ANESTHESIA:   general  EBL:  Total I/O In: -  Out: 50 [Urine:50]  BLOOD ADMINISTERED:none  DRAINS: none   LOCAL MEDICATIONS USED:  MARCAINE     SPECIMEN:  No Specimen  DISPOSITION OF SPECIMEN:  N/A  COUNTS:  YES  TOURNIQUET:  * No tourniquets in log *  DICTATION: .Dragon Dictation  PLAN OF CARE: Discharge to home after PACU  PATIENT DISPOSITION:  PACU - hemodynamically stable.   Delay start of Pharmacological VTE agent (>24hrs) due to surgical blood loss or risk of bleeding: not applicable  The risks, benefits, and alternatives of surgery were explained, understood, accepted.  In the operating room she was placed in the dorsal lithotomy position, and general anesthesia was given without complication. Her abdomen and vagina were prepped and draped in the usual sterile fashion. Her bladder was emptied. A timeout procedure was done. A bimanual exam revealed a small anteverted and mobile uterus. Her adnexa felt normal. A Hulka manipulator was placed.  Gloves were changed, and attention was turned to the abdomen. Approximately the 5 mL of 0.5% Marcaine was injected into the umbilicus. A vertical incision was made at the site. A varies needle was placed intraperitoneally. Low-flow CO2 was used to insufflate the abdomen to approximately 3 L. Once a good pneumoperitoneum was established, a 5 mm Excel trocar was placed in the umbilicus. Laparoscopy confirmed correct placement.  Her pelvis and upper abdomen appeared normal. There was no evidence of endometriosis or any other pathology. A subcuticular closure was done with 4-0 Vicryl suture at the umbilicus. She was extubated and taken to the recovery room  in stable condition.

## 2016-06-12 NOTE — Discharge Instructions (Signed)
°Post Anesthesia Home Care Instructions  ° °Activity: °Get plenty of rest for the remainder of the day. A responsible adult should stay with you for 24 hours following the procedure.  °For the next 24 hours, DO NOT: °-Drive a car °-Operate machinery °-Drink alcoholic beverages °-Take any medication unless instructed by your physician °-Make any legal decisions or sign important papers. ° °Meals: °Start with liquid foods such as gelatin or soup. Progress to regular foods as tolerated. Avoid greasy, spicy, heavy foods. If nausea and/or vomiting occur, drink only clear liquids until the nausea and/or vomiting subsides. Call your physician if vomiting continues. ° °Special Instructions/Symptoms: °Your throat may feel dry or sore from the anesthesia or the breathing tube placed in your throat during surgery. If this causes discomfort, gargle with warm salt water. The discomfort should disappear within 24 hours. ° °If you had a scopolamine patch placed behind your ear for the management of post- operative nausea and/or vomiting: ° °1. The medication in the patch is effective for 72 hours, after which it should be removed.  Wrap patch in a tissue and discard in the trash. Wash hands thoroughly with soap and water. °2. You may remove the patch earlier than 72 hours if you experience unpleasant side effects which may include dry mouth, dizziness or visual disturbances. °3. Avoid touching the patch. Wash your hands with soap and water after contact with the patch. °  °Diagnostic Laparoscopy °A diagnostic laparoscopy is a procedure to diagnose diseases in the abdomen. During the procedure, a thin, lighted, pencil-sized instrument called a laparoscope is inserted into the abdomen through an incision. The laparoscope allows your health care provider to look at the organs inside your body. °LET YOUR HEALTH CARE PROVIDER KNOW ABOUT: °· Any allergies you have. °· All medicines you are taking, including vitamins, herbs, eye drops,  creams, and over-the-counter medicines. °· Previous problems you or members of your family have had with the use of anesthetics. °· Any blood disorders you have. °· Previous surgeries you have had. °· Medical conditions you have. °RISKS AND COMPLICATIONS  °Generally, this is a safe procedure. However, problems can occur, which may include: °· Infection. °· Bleeding. °· Damage to other organs. °· Allergic reaction to the anesthetics used during the procedure. °BEFORE THE PROCEDURE °· Do not eat or drink anything after midnight on the night before the procedure or as directed by your health care provider. °· Ask your health care provider about: °¨ Changing or stopping your regular medicines. °¨ Taking medicines such as aspirin and ibuprofen. These medicines can thin your blood. Do not take these medicines before your procedure if your health care provider instructs you not to. °· Plan to have someone take you home after the procedure. °PROCEDURE °· You may be given a medicine to help you relax (sedative). °· You will be given a medicine to make you sleep (general anesthetic). °· Your abdomen will be inflated with a gas. This will make your organs easier to see. °· Small incisions will be made in your abdomen. °· A laparoscope and other small instruments will be inserted into the abdomen through the incisions. °· A tissue sample may be removed from an organ in the abdomen for examination. °· The instruments will be removed from the abdomen. °· The gas will be released. °· The incisions will be closed with stitches (sutures). °AFTER THE PROCEDURE  °Your blood pressure, heart rate, breathing rate, and blood oxygen level will be monitored often until the   medicines you were given have worn off. °  °This information is not intended to replace advice given to you by your health care provider. Make sure you discuss any questions you have with your health care provider. °  °Document Released: 01/28/2001 Document Revised:  07/13/2015 Document Reviewed: 06/04/2014 °Elsevier Interactive Patient Education ©2016 [employer] ° °

## 2016-06-12 NOTE — H&P (Signed)
Connie Fernandez is an 20 yo engaged WP0 here today because she thinks she thinks she has endometriosis. She reports moodiness about a week prior to and during her period. She also compains of pain in her vagina prior to and during her periods. She says that her cramps with her period are so bad that she will end of on the floor crying. She has radiation to her lower back. Also has headaches with her periods. She has tried OTC pain meds with no help.  She was prescribed OCPs (tried at least 3 brands), also has tried depo provera (3 shots) and Nexplanon (4-5 months).+  She also suffers with anxiety. She goes [facility]ennifer [facility] and has also seen a counselor.  Review of Systems She had the Gardasil series per her mom. Same sex relationship, monogamous for a year. Penetration is involved. Penetration hurts, sometimes she cries. Her mom has endometriosis. She reports a negative screen at [facility] earlier this year for CT/GC       Patient's last menstrual period was 05/31/2016 (exact date).    Past Medical History:  Diagnosis Date  . Anemia   . Anxiety   . Depression   . Dysmenorrhea in adolescent   . Endometriosis   . Headache    Migraines  . Heart palpitations   . Shortness of breath dyspnea    with exertion  . UTI (lower urinary tract infection)     History reviewed. No pertinent surgical history.  Family History  Problem Relation Age of Onset  . Diabetes Mother   . Alcohol abuse Father   . Hypertension Father   . COPD Maternal Grandmother   . Early death Maternal Grandmother 20  . Heart disease Maternal Grandmother   . Heart disease Maternal Grandfather 72  . Parkinsonism Paternal Grandmother     Social History:  reports that she has been smoking Cigarettes and E-cigarettes.  She has a 0.50 pack-year smoking history. She has never used smokeless tobacco. She reports that she drinks alcohol. She reports that she does not use drugs.  Allergies:   Allergies  Allergen Reactions  . Penicillins Hives    Has patient had a PCN reaction causing immediate rash, facial/tongue/throat swelling, SOB or lightheadedness with hypotension: Dizziness and shaky. Has patient had a PCN reaction causing severe rash involving mucus membranes or skin necrosis: NO Has patient had a PCN reaction that required hospitalization: NO Has patient had a PCN reaction occurring within the last 10 years: NO If all of the above answers are "NO", then may proceed with Cephalosporin use.     Prescriptions Prior to Admission  Medication Sig Dispense Refill Last Dose  . ibuprofen (ADVIL,MOTRIN) 200 MG tablet Take 400 mg by mouth every 6 (six) hours as needed for mild pain.   Past Week at Unknown time  . sulfamethoxazole-trimethoprim (BACTRIM,SEPTRA) 400-80 MG tablet Take 1 tablet by mouth 2 (two) times daily.   Past Week at Unknown time  . traMADol (ULTRAM) 50 MG tablet Take 1 tablet (50 mg total) by mouth every 6 (six) hours as needed. 60 tablet 0     ROS  Blood pressure 106/69, pulse 78, temperature 98.2 F (36.8 C), temperature source Oral, resp. rate 16, last menstrual period 05/31/2016, SpO2 100 %. Physical Exam  Thin WF, somewhat anxious but appears normal Breathing, conversing normally Abd- benign   Results for orders placed or performed during the hospital encounter of 06/12/16 (from the past 24 hour(s))  Pregnancy, urine  Status: None   Collection Time: 06/12/16  9:30 AM  Result Value Ref Range   Preg Test, Ur NEGATIVE NEGATIVE    No results found.  Assessment/Plan: Chronic pelvic pain- no help with progesterones, OCPs. I have offered her a diagnostic laparoscopy to see if I can find an etiology of her pain.  She understands the risks of surgery, including, but not to infection, bleeding, DVTs, damage to bowel, bladder, ureters. She wishes to proceed.     Allie Bossier 06/12/2016, 10:28 AM

## 2016-06-12 NOTE — Anesthesia Preprocedure Evaluation (Signed)
Anesthesia Evaluation  Patient identified by MRN, date of birth, ID band Patient awake    Reviewed: Allergy & Precautions, NPO status , Patient's Chart, lab work & pertinent test results  Airway Mallampati: I  TM Distance: >3 FB Neck ROM: Full    Dental   Pulmonary Current Smoker,    Pulmonary exam normal        Cardiovascular Normal cardiovascular exam     Neuro/Psych Anxiety Depression    GI/Hepatic   Endo/Other    Renal/GU      Musculoskeletal   Abdominal   Peds  Hematology   Anesthesia Other Findings   Reproductive/Obstetrics                             Anesthesia Physical Anesthesia Plan  ASA: II  Anesthesia Plan: General   Post-op Pain Management:    Induction: Intravenous  Airway Management Planned: Oral ETT  Additional Equipment:   Intra-op Plan:   Post-operative Plan: Extubation in OR  Informed Consent: I have reviewed the patients History and Physical, chart, labs and discussed the procedure including the risks, benefits and alternatives for the proposed anesthesia with the patient or authorized representative who has indicated his/her understanding and acceptance.     Plan Discussed with: CRNA and Surgeon  Anesthesia Plan Comments:         Anesthesia Quick Evaluation  

## 2016-06-12 NOTE — Transfer of Care (Signed)
Immediate Anesthesia Transfer of Care Note  Patient: Connie PortelaJessica Kue  Procedure(s) Performed: Procedure(s): LAPAROSCOPY DIAGNOSTIC (N/A)  Patient Location: PACU  Anesthesia Type:General  Level of Consciousness: awake, alert  and oriented  Airway & Oxygen Therapy: Patient Spontanous Breathing and Patient connected to nasal cannula oxygen  Post-op Assessment: Report given to RN and Post -op Vital signs reviewed and stable  Post vital signs: Reviewed and stable  Last Vitals:  Vitals:   06/12/16 0950  BP: 106/69  Pulse: 78  Resp: 16  Temp: 36.8 C    Last Pain:  Vitals:   06/12/16 0950  TempSrc: Oral         Complications: No apparent anesthesia complications

## 2016-06-12 NOTE — Anesthesia Procedure Notes (Signed)
Procedure Name: Intubation Date/Time: 06/12/2016 11:38 AM Performed by: Janeece AgeeWRAPE, Gaynell Eggleton W Pre-anesthesia Checklist: Patient identified, Emergency Drugs available, Suction available, Patient being monitored and Timeout performed Patient Re-evaluated:Patient Re-evaluated prior to inductionOxygen Delivery Method: Circle system utilized Preoxygenation: Pre-oxygenation with 100% oxygen Intubation Type: IV induction Ventilation: Mask ventilation without difficulty Laryngoscope Size: Mac and 3 Grade View: Grade I Tube type: Oral Tube size: 7.0 mm Number of attempts: 1 Airway Equipment and Method: Stylet Placement Confirmation: ETT inserted through vocal cords under direct vision,  positive ETCO2,  CO2 detector and breath sounds checked- equal and bilateral Secured at: 21 cm Tube secured with: Tape Dental Injury: Teeth and Oropharynx as per pre-operative assessment

## 2016-06-13 NOTE — Anesthesia Postprocedure Evaluation (Signed)
Anesthesia Post Note  Patient: Connie PortelaJessica Fernandez  Procedure(s) Performed: Procedure(s) (LRB): LAPAROSCOPY DIAGNOSTIC (N/A)  Patient location during evaluation: PACU Anesthesia Type: General Level of consciousness: awake and alert Pain management: pain level controlled Vital Signs Assessment: post-procedure vital signs reviewed and stable Respiratory status: spontaneous breathing, nonlabored ventilation, respiratory function stable and patient connected to nasal cannula oxygen Cardiovascular status: blood pressure returned to baseline and stable Postop Assessment: no signs of nausea or vomiting Anesthetic complications: no    Last Vitals:  Vitals:   06/12/16 1330 06/12/16 1355  BP: (!) 106/57 108/63  Pulse: 86 68  Resp: 17 16  Temp: 36.7 C 36.7 C    Last Pain:  Vitals:   06/12/16 0950  TempSrc: Oral                 Shantale Holtmeyer DAVID

## 2016-06-15 ENCOUNTER — Encounter (HOSPITAL_COMMUNITY): Payer: Self-pay | Admitting: Obstetrics & Gynecology

## 2016-06-16 MED ORDER — PROPOFOL 500 MG/50ML IV EMUL
INTRAVENOUS | Status: DC | PRN
  Administered 2016-06-12: 20 mL via INTRAVENOUS

## 2016-06-16 NOTE — Addendum Note (Signed)
Addendum  created 06/16/16 1943 by Janeece Ageeynthia W Oliverio Cho, CRNA   [facility] edited

## 2016-06-21 ENCOUNTER — Other Ambulatory Visit: Payer: Self-pay | Admitting: Family Medicine

## 2016-06-21 ENCOUNTER — Telehealth: Payer: Self-pay | Admitting: Family Medicine

## 2016-06-21 DIAGNOSIS — F321 Major depressive disorder, single episode, moderate: Secondary | ICD-10-CM

## 2016-06-21 NOTE — Telephone Encounter (Signed)
Referral placed.

## 2016-06-21 NOTE — Telephone Encounter (Signed)
Pt lvm asking for Dr. Adriana Simasook to put in a referral for her to see [facility]. Please advise.

## 2016-06-21 NOTE — Telephone Encounter (Signed)
Please advise 

## 2016-07-02 ENCOUNTER — Encounter: Payer: Self-pay | Admitting: Family

## 2016-07-02 ENCOUNTER — Ambulatory Visit (INDEPENDENT_AMBULATORY_CARE_PROVIDER_SITE_OTHER): Payer: BLUE CROSS/BLUE SHIELD | Admitting: Family

## 2016-07-02 VITALS — BP 116/80 | HR 86 | Temp 98.2°F | Wt 126.6 lb

## 2016-07-02 DIAGNOSIS — R062 Wheezing: Secondary | ICD-10-CM | POA: Diagnosis not present

## 2016-07-02 DIAGNOSIS — R1031 Right lower quadrant pain: Secondary | ICD-10-CM

## 2016-07-02 LAB — COMPREHENSIVE METABOLIC PANEL
ALT: 16 U/L (ref 6–29)
AST: 14 U/L (ref 10–30)
Albumin: 4.5 g/dL (ref 3.6–5.1)
Alkaline Phosphatase: 51 U/L (ref 33–115)
BUN: 11 mg/dL (ref 7–25)
CO2: 27 mmol/L (ref 20–31)
Calcium: 9.3 mg/dL (ref 8.6–10.2)
Chloride: 106 mmol/L (ref 98–110)
Creat: 0.78 mg/dL (ref 0.50–1.10)
Glucose, Bld: 89 mg/dL (ref 65–99)
Potassium: 4.2 mmol/L (ref 3.5–5.3)
Sodium: 140 mmol/L (ref 135–146)
Total Bilirubin: 0.5 mg/dL (ref 0.2–1.2)
Total Protein: 6.9 g/dL (ref 6.1–8.1)

## 2016-07-02 LAB — CBC WITH DIFFERENTIAL/PLATELET
Basophils Absolute: 128 cells/uL (ref 0–200)
Basophils Relative: 2 %
Eosinophils Absolute: 576 cells/uL — ABNORMAL HIGH (ref 15–500)
Eosinophils Relative: 9 %
HCT: 40.5 % (ref 35.0–45.0)
Hemoglobin: 13.6 g/dL (ref 11.7–15.5)
Lymphocytes Relative: 22 %
Lymphs Abs: 1408 cells/uL (ref 850–3900)
MCH: 29.1 pg (ref 27.0–33.0)
MCHC: 33.6 g/dL (ref 32.0–36.0)
MCV: 86.7 fL (ref 80.0–100.0)
MPV: 10.4 fL (ref 7.5–12.5)
Monocytes Absolute: 384 cells/uL (ref 200–950)
Monocytes Relative: 6 %
Neutro Abs: 3904 cells/uL (ref 1500–7800)
Neutrophils Relative %: 61 %
Platelets: 266 10*3/uL (ref 140–400)
RBC: 4.67 MIL/uL (ref 3.80–5.10)
RDW: 15.5 % — ABNORMAL HIGH (ref 11.0–15.0)
WBC: 6.4 10*3/uL (ref 3.8–10.8)

## 2016-07-02 LAB — HCG, SERUM, QUALITATIVE: Preg, Serum: NEGATIVE

## 2016-07-02 LAB — LIPASE: Lipase: 26 U/L (ref 7–60)

## 2016-07-02 LAB — POCT URINE PREGNANCY: Preg Test, Ur: NEGATIVE

## 2016-07-02 MED ORDER — ALBUTEROL SULFATE HFA 108 (90 BASE) MCG/ACT IN AERS: 2.0000 | INHALATION_SPRAY | Freq: Four times a day (QID) | RESPIRATORY_TRACT | 1 refills | Status: DC | PRN

## 2016-07-02 NOTE — Progress Notes (Signed)
Pre visit review using our clinic review tool, if applicable. No additional management support is needed unless otherwise documented below in the visit note. 

## 2016-07-02 NOTE — Patient Instructions (Signed)
Right now your abdominal pain is nonspecific at this time. While we are pending workup, if pain worsens or new symptoms develop, please go to emergency room.

## 2016-07-02 NOTE — Progress Notes (Signed)
Subjective:    Patient ID: Connie Fernandez, female    DOB: 1996/06/08, 20 y.o.   MRN: [id number]  CC: Connie Fernandez is a 20 y.o. female who presents today for an acute visit.    HPI: Here for acute visit for RLQ pain which started one week ago, worsening every day. Pain hasn't moved. Describes as pressure, achy, sharp.  s/p laparoscopic surgery for endometriosis 2 weeks ago. No endometriosis per patient.  Endorses inceased clear discharge from vagina. No vaginal itching. Pain worse with walking. Pain improved with 'pushing on it'. No Concern for STDs, pregnancy. Endorses nausea, low back ache, diarrhea. No dysuria, constipation.  LMP: 7/27.   Patient also endorses wheezing occasional worse after cigarettes. She also states she's coughing a lot at night. Denies shortness of breath. History of seasonal allergies. No history of asthma.    Negative pelvic US 7/27.   HISTORY:  Past Medical History:  Diagnosis Date  . Anemia   . Anxiety   . Depression   . Dysmenorrhea in adolescent   . Endometriosis   . Headache    Migraines  . Heart palpitations   . Shortness of breath dyspnea    with exertion  . UTI (lower urinary tract infection)    Past Surgical History:  Procedure Laterality Date  . LAPAROSCOPY N/A 06/12/2016   Procedure: LAPAROSCOPY DIAGNOSTIC;  Surgeon: Allie BossierMyra C Dove, MD;  Location: [facility];  Service: Gynecology;  Laterality: N/A;   Family History  Problem Relation Age of Onset  . Diabetes Mother   . Alcohol abuse Father   . Hypertension Father   . COPD Maternal Grandmother   . Early death Maternal Grandmother [phone]  . Heart disease Maternal Grandmother   . Heart disease Maternal Grandfather 72  . Parkinsonism Paternal Grandmother     Allergies: Latex and Penicillins Current Outpatient Prescriptions on File Prior to Visit  Medication Sig Dispense Refill  . ibuprofen (ADVIL,MOTRIN) 200 MG tablet Take 400 mg by mouth every 6 (six) hours as needed for mild pain.      Marland Kitchen. oxyCODONE-acetaminophen (PERCOCET/ROXICET) 5-325 MG tablet Take 1-2 tablets by mouth every 6 (six) hours as needed. 20 tablet 0  . sulfamethoxazole-trimethoprim (BACTRIM,SEPTRA) 400-80 MG tablet Take 1 tablet by mouth 2 (two) times daily.    . traMADol (ULTRAM) 50 MG tablet Take 1 tablet (50 mg total) by mouth every 6 (six) hours as needed. 60 tablet 0   No current facility-administered medications on file prior to visit.     Social History  Substance Use Topics  . Smoking status: Current Every Day Smoker    Packs/day: 0.50    Years: 1.00    Types: Cigarettes, E-cigarettes  . Smokeless tobacco: Never Used  . Alcohol use 0.0 oz/week     Comment: occ    Review of Systems  Constitutional: Negative for chills and fever.  Respiratory: Positive for cough (at night) and wheezing. Negative for shortness of breath.   Cardiovascular: Negative for chest pain and palpitations.  Gastrointestinal: Positive for abdominal pain, diarrhea and nausea. Negative for blood in stool, constipation and vomiting.  Genitourinary: Positive for vaginal discharge. Negative for dysuria, hematuria, urgency, vaginal bleeding and vaginal pain.      Objective:    BP 116/80   Pulse 86   Temp 98.2 F (36.8 C) (Oral)   Wt 126 lb 9.6 oz (57.4 kg)   LMP 06/26/2016 (Exact Date)   SpO2 98%   BMI 23.16 kg/m    Physical  Exam  Constitutional: She appears well-developed and well-nourished.  Eyes: Conjunctivae are normal.  Cardiovascular: Normal rate, regular rhythm, normal heart sounds and normal pulses.   Pulmonary/Chest: Effort normal. She has wheezes in the right lower field and the left lower field. She has no rhonchi. She has no rales.  Abdominal: There is no CVA tenderness.  Negative heel jar test.   Genitourinary: There is no rash, tenderness or lesion on the right labia. There is no rash, tenderness or lesion on the left labia. Cervix exhibits no motion tenderness and no discharge. Right adnexum displays  no mass, no tenderness and no fullness. Left adnexum displays no mass, no tenderness and no fullness. No erythema, tenderness or bleeding in the vagina. No foreign body in the vagina. No vaginal discharge found.  Genitourinary Comments: No vulvovaginal erythema. No lesions. Discharge is thin and clear, not purulent.   Neurological: She is alert.  Skin: Skin is warm and dry.  Psychiatric: She has a normal mood and affect. Her speech is normal and behavior is normal. Thought content normal.  Vitals reviewed.      Assessment & Plan:  1. RLQ abdominal pain Etiology unclear at this time. Differentials include ectopic, gastritis, post operative pain/complication, and bacterial vaginosis.  Pending further evaluation.   - POCT urine pregnancy - WET PREP BY MOLECULAR PROBE - Urinalysis - CULTURE, URINE COMPREHENSIVE - hCG, serum, qualitative - CBC with Differential/Platelet - Comprehensive metabolic panel - Lipase - US Abdomen Complete  2. Wheezing Suspect cigarette smoking is triggered asthma. Encouraged smoking cessation. Advised f/u with PCP for further evaluation of suspected asthma. No fever. SaO2 98%.  - albuterol (PROVENTIL HFA) 108 (90 Base) MCG/ACT inhaler; Inhale 2 puffs into the lungs every 6 (six) hours as needed for wheezing or shortness of breath.  Dispense: 1 Inhaler; Refill: 1     I am having Ms. Kniskern maintain her traMADol, ibuprofen, sulfamethoxazole-trimethoprim, and oxyCODONE-acetaminophen.   No orders of the defined types were placed in this encounter.   Return precautions given.   Risks, benefits, and alternatives of the medications and treatment plan prescribed today were discussed, and patient expressed understanding.   Education regarding symptom management and diagnosis given to patient on AVS.  Continue to follow with Tommie Sams, DO for routine health maintenance.   Connie Portela and I agreed with plan.   Rennie Plowman, FNP

## 2016-07-03 ENCOUNTER — Ambulatory Visit: Payer: BLUE CROSS/BLUE SHIELD

## 2016-07-03 ENCOUNTER — Telehealth: Payer: Self-pay | Admitting: *Deleted

## 2016-07-03 LAB — URINALYSIS
Bilirubin Urine: NEGATIVE
Hgb urine dipstick: NEGATIVE
Ketones, ur: NEGATIVE
Leukocytes, UA: NEGATIVE
Nitrite: NEGATIVE
Specific Gravity, Urine: 1.02 (ref 1.000–1.030)
Total Protein, Urine: NEGATIVE
Urine Glucose: NEGATIVE
Urobilinogen, UA: 0.2 (ref 0.0–1.0)
pH: 7 (ref 5.0–8.0)

## 2016-07-03 LAB — WET PREP BY MOLECULAR PROBE
Candida species: NEGATIVE
Gardnerella vaginalis: NEGATIVE
Trichomonas vaginosis: NEGATIVE

## 2016-07-03 NOTE — Telephone Encounter (Signed)
Patient was informed of results.  Patient understood and no questions, comments, or concerns at this time. Patient would like to reschedule US? She was sick this morning and chose not to get it done.  Also Patient is asking for pain medication, Percocet.  Please advise.

## 2016-07-03 NOTE — Telephone Encounter (Signed)
Pleas advise pt of results.

## 2016-07-03 NOTE — Telephone Encounter (Signed)
Patients requested lab results  Pt contact [phone]

## 2016-07-03 NOTE — Telephone Encounter (Signed)
[name], Can we reschedule patient's US?   Mal AmabileBrock,  Please call patient.  She may use a heating pad, rest, and OTC tyelonol for pain. Narcotics are not appropriate.  Please advise f/u with PCP.

## 2016-07-04 ENCOUNTER — Emergency Department: Payer: BLUE CROSS/BLUE SHIELD

## 2016-07-04 ENCOUNTER — Emergency Department (HOSPITAL_COMMUNITY)
Admission: EM | Admit: 2016-07-04 | Discharge: 2016-07-04 | Disposition: A | Discharge status: Home / Self Care | Payer: BLUE CROSS/BLUE SHIELD | Attending: Emergency Medicine | Admitting: Emergency Medicine

## 2016-07-04 ENCOUNTER — Emergency Department
Admission: EM | Admit: 2016-07-04 | Discharge: 2016-07-04 | Disposition: A | Discharge status: Other Acute Inpatient Hospital | Payer: BLUE CROSS/BLUE SHIELD

## 2016-07-04 ENCOUNTER — Emergency Department
Admission: EM | Admit: 2016-07-04 | Discharge: 2016-07-04 | Disposition: A | Discharge status: Home / Self Care | Payer: BLUE CROSS/BLUE SHIELD | Attending: Emergency Medicine | Admitting: Emergency Medicine

## 2016-07-04 ENCOUNTER — Encounter (HOSPITAL_COMMUNITY): Payer: Self-pay

## 2016-07-04 DIAGNOSIS — Z91040 Latex allergy status: Secondary | ICD-10-CM | POA: Diagnosis not present

## 2016-07-04 DIAGNOSIS — Z79899 Other long term (current) drug therapy: Secondary | ICD-10-CM | POA: Diagnosis not present

## 2016-07-04 DIAGNOSIS — M549 Dorsalgia, unspecified: Secondary | ICD-10-CM | POA: Diagnosis not present

## 2016-07-04 DIAGNOSIS — R112 Nausea with vomiting, unspecified: Secondary | ICD-10-CM | POA: Diagnosis not present

## 2016-07-04 DIAGNOSIS — F17210 Nicotine dependence, cigarettes, uncomplicated: Secondary | ICD-10-CM | POA: Insufficient documentation

## 2016-07-04 DIAGNOSIS — R197 Diarrhea, unspecified: Secondary | ICD-10-CM | POA: Diagnosis not present

## 2016-07-04 DIAGNOSIS — R1031 Right lower quadrant pain: Secondary | ICD-10-CM | POA: Insufficient documentation

## 2016-07-04 DIAGNOSIS — R102 Pelvic and perineal pain: Secondary | ICD-10-CM | POA: Diagnosis not present

## 2016-07-04 DIAGNOSIS — R109 Unspecified abdominal pain: Secondary | ICD-10-CM

## 2016-07-04 DIAGNOSIS — G8929 Other chronic pain: Secondary | ICD-10-CM

## 2016-07-04 LAB — COMPREHENSIVE METABOLIC PANEL
ALT: 18 U/L (ref 14–54)
AST: 19 U/L (ref 15–41)
Albumin: 4.7 g/dL (ref 3.5–5.0)
Alkaline Phosphatase: 56 U/L (ref 38–126)
Anion gap: 4 — ABNORMAL LOW (ref 5–15)
BUN: 9 mg/dL (ref 6–20)
CO2: 26 mmol/L (ref 22–32)
Calcium: 9.2 mg/dL (ref 8.9–10.3)
Chloride: 108 mmol/L (ref 101–111)
Creatinine, Ser: 0.63 mg/dL (ref 0.44–1.00)
GFR calc Af Amer: >60 mL/min (ref >60)
GFR calc non Af Amer: >60 mL/min (ref >60)
Glucose, Bld: 108 mg/dL — ABNORMAL HIGH (ref 65–99)
Potassium: 3.8 mmol/L (ref 3.5–5.1)
Sodium: 138 mmol/L (ref 135–145)
Total Bilirubin: 0.3 mg/dL (ref 0.3–1.2)
Total Protein: 7.3 g/dL (ref 6.5–8.1)

## 2016-07-04 LAB — CBC
HCT: 39.1 % (ref 35.0–47.0)
Hemoglobin: 13.6 g/dL (ref 12.0–16.0)
MCH: 29.9 pg (ref 26.0–34.0)
MCHC: 34.7 g/dL (ref 32.0–36.0)
MCV: 86.2 fL (ref 80.0–100.0)
Platelets: 223 10*3/uL (ref 150–440)
RBC: 4.53 MIL/uL (ref 3.80–5.20)
RDW: 15.7 % — ABNORMAL HIGH (ref 11.5–14.5)
WBC: 11.3 10*3/uL — ABNORMAL HIGH (ref 3.6–11.0)

## 2016-07-04 LAB — URINALYSIS COMPLETE WITH MICROSCOPIC (ARMC ONLY)
Bilirubin Urine: NEGATIVE
Glucose, UA: NEGATIVE mg/dL
Ketones, ur: NEGATIVE mg/dL
Leukocytes, UA: NEGATIVE
Nitrite: NEGATIVE
Protein, ur: NEGATIVE mg/dL
Specific Gravity, Urine: 1.008 (ref 1.005–1.030)
pH: 6 (ref 5.0–8.0)

## 2016-07-04 MED ORDER — KETOROLAC TROMETHAMINE 60 MG/2ML IM SOLN
60.0000 mg | Freq: Once | INTRAMUSCULAR | Status: DC
  Filled 2016-07-04: qty 2

## 2016-07-04 NOTE — ED Notes (Signed)
Pt left after provider spoke with her regarding results of previous studies and answered all the questions pt had.  Pt and family member ignored this nurse when asked if they were leaving.  Pt and family headed towards ED exit door.

## 2016-07-04 NOTE — ED Triage Notes (Signed)
Per Pt, Pt is coming from home with complaints of RLQ pain. Pt reports diarrhea and vomiting. Pt was seen by PCP and then [facility] with no relief. PCP reported potential appendicitis. Complains of chills and fevers.

## 2016-07-04 NOTE — ED Notes (Signed)
Pt. And s/o sleeping in bed comfortably. No signs distress at this time

## 2016-07-04 NOTE — ED Notes (Signed)
MD at Bedside.

## 2016-07-04 NOTE — ED Triage Notes (Addendum)
Pt in with right lower back and right lower abd pain for few weeks. Had a workup here in the ED in July and referred to gyn for follow up . They did exploratory lap and had no abnormal finding. Pt was scheduled for ultrasound yesterday but she missed it.

## 2016-07-04 NOTE — ED Provider Notes (Signed)
[facility] Emergency Department Provider Note   ____________________________________________   First MD Initiated Contact with Patient 07/04/16 [phone]     (approximate)  I have reviewed the triage vital signs and the nursing notes.   HISTORY  Chief Complaint Abdominal Pain    HPI Connie Fernandez is a 20 y.o. female who comes into the hospital today with right-sided abdominal pain. She reports that she's had this for a few days. She went to see her doctor yesterday and she had blood work done. She reports that she was told she had a UTI and she was placed on Bactrim. The patient has taken 3 doses but reports that she is still hurting. The patient rates her pain 8 out of 10 in intensity. She reports that she's not been vomiting and she has no pain with urination. She wants to know what's going on. She reports that her doctor wanted her to get an ultrasound earlier today but she missed that appointment. The patient had this pain a year ago and was given medicine but the pain went away. She does not move her the name of the medicine. The patient recently had an exploratory laparoscopy due to some abdominal cramps that she was having but everything was negative. The patient was being evaluated for endometriosis. The patient is here for evaluation today.   Past Medical History:  Diagnosis Date  . Anemia   . Anxiety   . Depression   . Dysmenorrhea in adolescent   . Endometriosis   . Headache    Migraines  . Heart palpitations   . Shortness of breath dyspnea    with exertion  . UTI (lower urinary tract infection)     Patient Active Problem List   Diagnosis Date Noted  . Eosinophilia 02/13/2016  . Nausea with vomiting 02/13/2016  . Low back pain 11/23/2015  . Tobacco use disorder 10/28/2015  . Panic disorder 10/28/2015  . Major depressive disorder, single episode, moderate (HCC) 10/28/2015    Past Surgical History:  Procedure Laterality Date  .  LAPAROSCOPY N/A 06/12/2016   Procedure: LAPAROSCOPY DIAGNOSTIC;  Surgeon: Allie BossierMyra C Dove, MD;  Location: [facility];  Service: Gynecology;  Laterality: N/A;    Prior to Admission medications   Medication Sig Start Date End Date Taking? Authorizing Provider  albuterol (PROVENTIL HFA) 108 (90 Base) MCG/ACT inhaler Inhale 2 puffs into the lungs every 6 (six) hours as needed for wheezing or shortness of breath. 07/02/16   Allegra GranaMargaret G Arnett, FNP  ibuprofen (ADVIL,MOTRIN) 200 MG tablet Take 400 mg by mouth every 6 (six) hours as needed for mild pain.    Historical Provider, MD  oxyCODONE-acetaminophen (PERCOCET/ROXICET) 5-325 MG tablet Take 1-2 tablets by mouth every 6 (six) hours as needed. 06/12/16   Allie BossierMyra C Dove, MD  sulfamethoxazole-trimethoprim (BACTRIM,SEPTRA) 400-80 MG tablet Take 1 tablet by mouth 2 (two) times daily.    Historical Provider, MD  traMADol (ULTRAM) 50 MG tablet Take 1 tablet (50 mg total) by mouth every 6 (six) hours as needed. 05/17/16   Allie BossierMyra C Dove, MD    Allergies Latex and Penicillins  Family History  Problem Relation Age of Onset  . Diabetes Mother   . Alcohol abuse Father   . Hypertension Father   . COPD Maternal Grandmother   . Early death Maternal Grandmother [phone]  . Heart disease Maternal Grandmother   . Heart disease Maternal Grandfather 72  . Parkinsonism Paternal Grandmother     Social History Social History  Substance Use Topics  . Smoking status: Current Every Day Smoker    Packs/day: 0.50    Years: 1.00    Types: Cigarettes, E-cigarettes  . Smokeless tobacco: Never Used  . Alcohol use 0.0 oz/week     Comment: occ    Review of Systems Constitutional: No fever/chills Eyes: No visual changes. ENT: No sore throat. Cardiovascular: Denies chest pain. Respiratory: Denies shortness of breath. Gastrointestinal:  abdominal pain.  No nausea, no vomiting.  No diarrhea.  No constipation. Genitourinary: Negative for dysuria. Musculoskeletal: back pain. Skin:  Negative for rash. Neurological: Negative for headaches, focal weakness or numbness.  10-point ROS otherwise negative.  ____________________________________________   PHYSICAL EXAM:  VITAL SIGNS: ED Triage Vitals  Enc Vitals Group     BP 07/04/16 0236 95/79     Pulse Rate 07/04/16 0236 86     Resp 07/04/16 0236 18     Temp 07/04/16 0236 97.6 F (36.4 C)     Temp Source 07/04/16 0236 Oral     SpO2 07/04/16 0236 99 %     Weight 07/04/16 0237 126 lb (57.2 kg)     Height 07/04/16 0237 5\' 2"  (1.575 m)     Head Circumference --      Peak Flow --      Pain Score 07/04/16 0237 10     Pain Loc --      Pain Edu? --      Excl. in GC? --     Constitutional: Alert and oriented. Well appearing and in mild distress. Eyes: Conjunctivae are normal. PERRL. EOMI. Head: Atraumatic. Nose: No congestion/rhinnorhea. Mouth/Throat: Mucous membranes are moist.  Oropharynx non-erythematous. Cardiovascular: Normal rate, regular rhythm. Grossly normal heart sounds.  Good peripheral circulation. Respiratory: Normal respiratory effort.  No retractions. Lungs CTAB. Gastrointestinal: Soft with mild RLQ ttp. No distention. Positive bowel sounds right CVA tendereness to palpitation Musculoskeletal: No lower extremity tenderness nor edema.  Neurologic:  Normal speech and language.  Skin:  Skin is warm, dry and intact.  Psychiatric: Mood and affect are normal.   ____________________________________________   LABS (all labs ordered are listed, but only abnormal results are displayed)  Labs Reviewed  URINALYSIS COMPLETEWITH MICROSCOPIC ([facility] ONLY) - Abnormal; Notable for the following:       Result Value   Color, Urine STRAW (*)    APPearance CLEAR (*)    Hgb urine dipstick 1+ (*)    Bacteria, UA RARE (*)    Squamous Epithelial / LPF 0-5 (*)    All other components within normal limits  CBC - Abnormal; Notable for the following:    WBC 11.3 (*)    RDW 15.7 (*)    All other components within  normal limits  COMPREHENSIVE METABOLIC PANEL - Abnormal; Notable for the following:    Glucose, Bld 108 (*)    Anion gap 4 (*)    All other components within normal limits   ____________________________________________  EKG  none ____________________________________________  RADIOLOGY  US pelvis ____________________________________________   PROCEDURES  Procedure(s) performed: None  Procedures  Critical Care performed: No  ____________________________________________   INITIAL IMPRESSION / ASSESSMENT AND PLAN / ED COURSE  Pertinent labs & imaging results that were available during my care of the patient were reviewed by me and considered in my medical decision making (see chart for details).  This is a 20 year old female who comes into the hospital today with some right lower quadrant pain. The patient has been evaluated by her doctor but she  still hurting. I checked some blood work and I will continue talking to the patient. We'll perform the ultrasound to determine that the patient has a system of the cause of her pain. I ordered some Toradol for the patient for her pain.  Clinical Course  Value Comment By Time  US Pelvis Complete Normal ultrasound appearance of the uterus and ovaries. Rebecka Apley, MD 08/30 [phone]   The patient refused the Toradol while in the emergency department. She reports that she doesn't like needles. The patient has had some pelvic pain frequently in the past which was the cause for her diagnostic laparoscopy. The patient reports she also had a pelvic exam at her doctor's office yesterday. We did discuss that the other concern would be appendicitis but the patient at this time has no secondary symptoms. She is sleeping on the bed with her partner and in no distress. I discussed imaging for further evaluation but the patient reports that she will follow-up with her doctor. The patient will be discharged to  home.  ____________________________________________   FINAL CLINICAL IMPRESSION(S) / ED DIAGNOSES  Final diagnoses:  Right lower quadrant abdominal pain      NEW MEDICATIONS STARTED DURING THIS VISIT:  New Prescriptions   No medications on file     Note:  This document was prepared using Dragon voice recognition software and may include unintentional dictation errors.    Rebecka Apley, MD 07/04/16 [phone]

## 2016-07-04 NOTE — ED Provider Notes (Signed)
[facility]-EM[facility]Y DEPT Provider Note   CSN: [id number] Arrival date & time: 07/04/16  1219     History   Chief Complaint Chief Complaint  Patient presents with  . Abdominal Pain    HPI Connie Fernandez is a 20 y.o. female with a PMHx of anemia, endometriosis (although neg ex-lap on 06/12/16 by OBGYN), dysmenorrhea, anxiety, chronic low back pain, tobacco use, and UTI, who presents to the ED with complaints of right lower quadrant abdominal pain, nausea, vomiting, and diarrhea. She was seen at [facility] this morning approx 8hrs prior to arrival here, only complaining of abd pain and denying all other symptoms, had neg CBC/CMP, unremarkable U/A, and neg U/S of pelvis, and left before her discharge paperwork was given after refusing toradol while she was in the ED. Similar situation happened on 05/31/16 [facility] visit, was seen and then left before work up was done. She had neg Upreg and neg wet prep on 07/02/16 at PCP's office and at that visit was diagnosed with UTI for which she's on bactrim. No mention in PCP's notes regarding concern for appendicitis, [facility] provider today also mentions that she didn't feel this was appendicitis and therefore no further testing was done this morning. Additionally, she called her PCP's office yesterday on 07/03/16 requesting percocet for her pain, which they denied stating that narcotics weren't appropriate for this issue.   She states that she came here because she is continuing to have pain and tells me "I'm here for my appendix". She isn't initially forthcoming regarding her recent visits and work ups, until after she is confronted. She describes her abdominal pain is 10/10 intermittent sharp nonradiating right lower quadrant pain worse with walking and with no treatments tried prior to arrival. She also reports nausea, 3 episodes of nonbloody nonbilious emesis in the last 24 hours, and 2 episodes of watery nonbloody diarrhea in the last 24 hours. LMP 06/26/16. She states that  since surgery she has had some thin vaginal discharge, for which she was seen at her PCPs office 2 days ago, denies any new or worsening discharge. States that there is occasional spotting type vaginal bleeding, which has not changed since her PCP visit 2 days ago. She denies any fevers, chills, chest pain, shortness breath, hematemesis, melena, hematochezia, obstipation, constipation, dysuria, hematuria, numbness, tingling, focal weakness, recent travel, sick contacts, suspicious food intake, alcohol use, or chronic NSAID use.   The history is provided by the patient and medical records. No language interpreter was used.  Abdominal Pain   This is a recurrent problem. The current episode started more than 2 days ago. The problem occurs daily. The problem has not changed since onset.The pain is associated with a previous surgery. The pain is located in the RLQ. The quality of the pain is sharp. The pain is at a severity of 10/10. The pain is severe. Associated symptoms include diarrhea, nausea and vomiting. Pertinent negatives include fever, flatus, hematochezia, melena, constipation, dysuria, hematuria, arthralgias and myalgias. The symptoms are aggravated by activity. Nothing relieves the symptoms. Past workup includes ultrasound.    Past Medical History:  Diagnosis Date  . Anemia   . Anxiety   . Depression   . Dysmenorrhea in adolescent   . Endometriosis   . Headache    Migraines  . Heart palpitations   . Shortness of breath dyspnea    with exertion  . UTI (lower urinary tract infection)     Patient Active Problem List   Diagnosis Date Noted  .  Eosinophilia 02/13/2016  . Nausea with vomiting 02/13/2016  . Low back pain 11/23/2015  . Tobacco use disorder 10/28/2015  . Panic disorder 10/28/2015  . Major depressive disorder, single episode, moderate (HCC) 10/28/2015    Past Surgical History:  Procedure Laterality Date  . LAPAROSCOPY N/A 06/12/2016   Procedure: LAPAROSCOPY DIAGNOSTIC;   Surgeon: Allie Bossier, MD;  Location: [facility];  Service: Gynecology;  Laterality: N/A;    OB History    No data available       Home Medications    Prior to Admission medications   Medication Sig Start Date End Date Taking? Authorizing Provider  albuterol (PROVENTIL HFA) 108 (90 Base) MCG/ACT inhaler Inhale 2 puffs into the lungs every 6 (six) hours as needed for wheezing or shortness of breath. 07/02/16   Allegra Grana, FNP  ibuprofen (ADVIL,MOTRIN) 200 MG tablet Take 400 mg by mouth every 6 (six) hours as needed for mild pain.    Historical Provider, MD  oxyCODONE-acetaminophen (PERCOCET/ROXICET) 5-325 MG tablet Take 1-2 tablets by mouth every 6 (six) hours as needed. 06/12/16   Allie Bossier, MD  sulfamethoxazole-trimethoprim (BACTRIM,SEPTRA) 400-80 MG tablet Take 1 tablet by mouth 2 (two) times daily.    Historical Provider, MD  traMADol (ULTRAM) 50 MG tablet Take 1 tablet (50 mg total) by mouth every 6 (six) hours as needed. 05/17/16   Allie Bossier, MD    Family History Family History  Problem Relation Age of Onset  . Diabetes Mother   . Alcohol abuse Father   . Hypertension Father   . COPD Maternal Grandmother   . Early death Maternal Grandmother 44  . Heart disease Maternal Grandmother   . Heart disease Maternal Grandfather 72  . Parkinsonism Paternal Grandmother     Social History Social History  Substance Use Topics  . Smoking status: Current Every Day Smoker    Packs/day: 0.50    Years: 1.00    Types: Cigarettes, E-cigarettes  . Smokeless tobacco: Never Used  . Alcohol use No     Comment: occ     Allergies   Latex and Penicillins   Review of Systems Review of Systems  Constitutional: Negative for fever.  Respiratory: Negative for shortness of breath.   Cardiovascular: Negative for chest pain.  Gastrointestinal: Positive for abdominal pain, diarrhea, nausea and vomiting. Negative for blood in stool, constipation, flatus, hematochezia and melena.    Genitourinary: Negative for dysuria, hematuria, vaginal bleeding (no new or worsening bleeding) and vaginal discharge (no new or different discharge).  Musculoskeletal: Negative for arthralgias and myalgias.  Skin: Negative for color change.  Allergic/Immunologic: Negative for immunocompromised state.  Neurological: Negative for weakness and numbness.  Psychiatric/Behavioral: Negative for confusion.   10 Systems reviewed and are negative for acute change except as noted in the HPI.   Physical Exam Updated Vital Signs BP 111/72 (BP Location: Right Arm)   Pulse 99   Temp 98.1 F (36.7 C) (Oral)   Resp 16   Ht 5\' 2"  (1.575 m)   Wt 57.2 kg   LMP 06/26/2016 (Exact Date)   SpO2 97%   BMI 23.05 kg/m   Physical Exam  Constitutional: She is oriented to person, place, and time. Vital signs are normal. She appears well-developed and well-nourished.  Non-toxic appearance. No distress.  Afebrile, nontoxic, NAD  HENT:  Head: Normocephalic and atraumatic.  Mouth/Throat: Oropharynx is clear and moist and mucous membranes are normal.  Eyes: Conjunctivae and EOM are normal. Right eye exhibits  no discharge. Left eye exhibits no discharge.  Neck: Normal range of motion. Neck supple.  Cardiovascular: Normal rate, regular rhythm, normal heart sounds and intact distal pulses.  Exam reveals no gallop and no friction rub.   No murmur heard. Pulmonary/Chest: Effort normal and breath sounds normal. No respiratory distress. She has no decreased breath sounds. She has no wheezes. She has no rhonchi. She has no rales.  Abdominal: Soft. Normal appearance and bowel sounds are normal. She exhibits no distension. There is tenderness in the right lower quadrant, suprapubic area and left lower quadrant. There is no rigidity, no rebound, no guarding, no CVA tenderness, no tenderness at McBurney's point and negative Murphy's sign.    Soft, nondistended, +BS throughout, with very minimal tenderness in the lower  abdomen along the pelvic brim, absolutely NO tenderness higher up in the lower abdominal quadrants, no tenderness at mcburney's point, no r/g/r, neg murphy's, no CVA TTP   Genitourinary:  Genitourinary Comments: Deferred due to recent pelvic exam  Musculoskeletal: Normal range of motion.  Neurological: She is alert and oriented to person, place, and time. She has normal strength. No sensory deficit.  Skin: Skin is warm, dry and intact. No rash noted.  Psychiatric: She has a normal mood and affect.  Nursing note and vitals reviewed.    ED Treatments / Results  Labs (all labs ordered are listed, but only abnormal results are displayed) Labs Reviewed - No data to display Results for orders placed or performed during the hospital encounter of 07/04/16  Urinalysis complete, with microscopic [facility] only)  Result Value Ref Range   Color, Urine STRAW (A) YELLOW   APPearance CLEAR (A) CLEAR   Glucose, UA NEGATIVE NEGATIVE mg/dL   Bilirubin Urine NEGATIVE NEGATIVE   Ketones, ur NEGATIVE NEGATIVE mg/dL   Specific Gravity, Urine 1.008 1.005 - 1.030   Hgb urine dipstick 1+ (A) NEGATIVE   pH 6.0 5.0 - 8.0   Protein, ur NEGATIVE NEGATIVE mg/dL   Nitrite NEGATIVE NEGATIVE   Leukocytes, UA NEGATIVE NEGATIVE   RBC / HPF 0-5 0 - 5 RBC/hpf   WBC, UA 0-5 0 - 5 WBC/hpf   Bacteria, UA RARE (A) NONE SEEN   Squamous Epithelial / LPF 0-5 (A) NONE SEEN   Mucous PRESENT   CBC  Result Value Ref Range   WBC 11.3 (H) 3.6 - 11.0 K/uL   RBC 4.53 3.80 - 5.20 MIL/uL   Hemoglobin 13.6 12.0 - 16.0 g/dL   HCT 16.1 09.6 - 04.5 %   MCV 86.2 80.0 - 100.0 fL   MCH 29.9 26.0 - 34.0 pg   MCHC 34.7 32.0 - 36.0 g/dL   RDW 40.9 (H) 81.1 - 91.4 %   Platelets 223 150 - 440 K/uL  Comprehensive metabolic panel  Result Value Ref Range   Sodium 138 135 - 145 mmol/L   Potassium 3.8 3.5 - 5.1 mmol/L   Chloride 108 101 - 111 mmol/L   CO2 26 22 - 32 mmol/L   Glucose, Bld 108 (H) 65 - 99 mg/dL   BUN 9 6 - 20 mg/dL    Creatinine, Ser 7.82 0.44 - 1.00 mg/dL   Calcium 9.2 8.9 - 95.6 mg/dL   Total Protein 7.3 6.5 - 8.1 g/dL   Albumin 4.7 3.5 - 5.0 g/dL   AST 19 15 - 41 U/L   ALT 18 14 - 54 U/L   Alkaline Phosphatase 56 38 - 126 U/L   Total Bilirubin 0.3 0.3 - 1.2 mg/dL  GFR calc non Af Amer >60 >60 mL/min   GFR calc Af Amer >60 >60 mL/min   Anion gap 4 (L) 5 - 15  Results for Connie Fernandez, Connie Fernandez (MRN [id number]) as of 07/04/2016 12:33  Ref. Range 07/02/2016 15:47 07/02/2016 15:55  Preg Test, Ur Latest Ref Range: Negative   Negative  Trichomonas vaginosis Latest Ref Range: Negative  NEG   Candida species Latest Ref Range: Negative  NEG   Gardnerella vaginalis Latest Ref Range: Negative  NEG     EKG  EKG Interpretation None       Radiology [place] Transvaginal Non-ob  Result Date: 07/04/2016 CLINICAL DATA:  Right lower quadrant pain for 4 days. EXAM: TRANSABDOMINAL AND TRANSVAGINAL ULTRASOUND OF PELVIS TECHNIQUE: Both transabdominal and transvaginal ultrasound examinations of the pelvis were performed. Transabdominal technique was performed for global imaging of the pelvis including uterus, ovaries, adnexal regions, and pelvic cul-de-sac. It was necessary to proceed with endovaginal exam following the transabdominal exam to visualize the ovaries and endometrium. COMPARISON:  05/31/2016 FINDINGS: Uterus Measurements: 6.8 x 3 x 4.9 cm. Uterus is anteverted. No fibroids or other mass visualized. Endometrium Thickness: 7.5 mm.  No focal abnormality visualized. Right ovary Measurements: 3.3 x 1.7 x 2.7 cm. Normal follicular cysts. Normal appearance/no adnexal mass. Left ovary Measurements: 2.8 x 1.7 x 1.9 cm. Normal appearance/no adnexal mass. Other findings No abnormal free fluid. IMPRESSION: Normal ultrasound appearance of the uterus and ovaries. Electronically Signed   By: Burman NievesWilliam  Stevens M.D.   On: 07/04/2016 03:45   [place] Pelvis Complete  Result Date: 07/04/2016 CLINICAL DATA:  Right lower quadrant pain for 4  days. EXAM: TRANSABDOMINAL AND TRANSVAGINAL ULTRASOUND OF PELVIS TECHNIQUE: Both transabdominal and transvaginal ultrasound examinations of the pelvis were performed. Transabdominal technique was performed for global imaging of the pelvis including uterus, ovaries, adnexal regions, and pelvic cul-de-sac. It was necessary to proceed with endovaginal exam following the transabdominal exam to visualize the ovaries and endometrium. COMPARISON:  05/31/2016 FINDINGS: Uterus Measurements: 6.8 x 3 x 4.9 cm. Uterus is anteverted. No fibroids or other mass visualized. Endometrium Thickness: 7.5 mm.  No focal abnormality visualized. Right ovary Measurements: 3.3 x 1.7 x 2.7 cm. Normal follicular cysts. Normal appearance/no adnexal mass. Left ovary Measurements: 2.8 x 1.7 x 1.9 cm. Normal appearance/no adnexal mass. Other findings No abnormal free fluid. IMPRESSION: Normal ultrasound appearance of the uterus and ovaries. Electronically Signed   By: Burman NievesWilliam  Stevens M.D.   On: 07/04/2016 03:45    Procedures Procedures (including critical care time)  Medications Ordered in ED Medications - No data to display   Initial Impression / Assessment and Plan / ED Course  I have reviewed the triage vital signs and the nursing notes.  Pertinent labs & imaging results that were available during my care of the patient were reviewed by me and considered in my medical decision making (see chart for details).  Clinical Course    20 y.o. female here for abd pain. Was seen at [facility] this morning approx 8hrs prior to arrival here, had neg CBC/CMP, unremarkable U/A, and neg U/S of pelvis, had neg Upreg and wet prep on 07/02/16 at PCP's office and at that visit was diagnosed with UTI for which she's on bactrim. No mention in PCP's notes regarding concern for appendicitis, [facility] provider today also mentions that she didn't feel this was appendicitis and therefore no further testing was done this morning. She refused toradol while at  [facility], and then left before her paperwork was given.  Similar situation happened on 05/31/16 [facility] visit, was seen and then left before work up was done. At today's visit she's saying she's also having n/v/d, which she denied at the earlier ED visit. I do not feel she's being truthful, and I question whether she's drug seeking, especially since she called her PCP yesterday and asked specifically for percocet. On exam, minimal tenderness in lower abdomen along the pelvic brim bilaterally, no mcburney's point tenderness whatsoever, and no r/g/r or peritoneal signs. Her work up earlier was unremarkable, I do not feel we need to repeat this. I highly doubt appendicitis, especially since this pain seems to be more chronic than what she's stating today, and diarrhea wouldn't go along with this diagnosis, nor would a negative work up from today's visit. Long conversation was had regarding her past neg work ups and the low suspicion for appendicitis, and that she needed to use tylenol/motrin for symptoms and f/up with her PCP and/or OBGYN. She became upset, and after agreeing to plan, she walked out before her paperwork was given to her. Before she left, I explained the diagnosis and have given explicit precautions to return to the ER including for any other new or worsening symptoms. The patient understands and accepts the medical plan as it's been dictated and I have answered their questions. The patient is STABLE and is discharged to home in good condition.   Final Clinical Impressions(s) / ED Diagnoses   Final diagnoses:  Chronic abdominal pain  Nausea vomiting and diarrhea  Pelvic pain in female    New Prescriptions New Prescriptions   No medications on file     [place] Run Camprubi-Soms, PA-C 07/04/16 1304    Melene Plan, DO 07/05/16 0920

## 2016-07-04 NOTE — ED Notes (Addendum)
Pt at STAT desk in w/c, crying; accomp by mother who is cursing loudly and complaining about care received at this hospital; requested mother to stop cursing in lobby, explained that the patient will be evaluated as soon as possible; mother continues to loud and curse stating "well I'm gonna have to be arrested before I stop yelling; they better not have left anything inside her! They gonna get sued! And that same dam doctor better not be here tonight! I'm tired of getting billed $5000 everytime I come in here!!"; [place] [employer] officer over to speak with pt's mother

## 2016-07-04 NOTE — ED Notes (Signed)
Pt & mother noted leaving ED lobby; [place] [employer] officer reports pt left, stating [facility]

## 2016-07-04 NOTE — Discharge Instructions (Signed)
Abdominal (belly) pain can be caused by many things. Your caregiver performed an examination and possibly ordered blood/urine tests and imaging (CT scan, x-rays, ultrasound). Many cases can be observed and treated at home after initial evaluation in the emergency department. Even though you are being discharged home, abdominal pain can be unpredictable. Therefore, you need a repeated exam if your pain does not resolve, returns, or worsens. Most patients with abdominal pain don't have to be admitted to the hospital or have surgery, but serious problems like appendicitis and gallbladder attacks can start out as nonspecific pain. Many abdominal conditions cannot be diagnosed in one visit, so follow-up evaluations are very important. SEEK IMMEDIATE MEDICAL ATTENTION IF YOU DEVELOP ANY OF THE FOLLOWING SYMPTOMS: The pain does not go away or becomes severe.  A temperature above 101 develops.  Repeated vomiting occurs (multiple episodes).  The pain becomes localized to portions of the abdomen. The right side could possibly be appendicitis. In an adult, the left lower portion of the abdomen could be colitis or diverticulitis.  Blood is being passed in stools or vomit (bright red or black tarry stools).  Return also if you develop chest pain, difficulty breathing, dizziness or fainting, or become confused, poorly responsive, or inconsolable (young children). The constipation stays for more than 4 days.  There is belly (abdominal) or rectal pain.  You do not seem to be getting better.

## 2016-07-04 NOTE — ED Notes (Signed)
Following MD follow-up with pt, pt. Advised MD would be printing d/c paperwork and RN would return in just a moment to d/c pt. Upon return to tx. Room to d/c pt, this RN discovered pt. Had left the room, leaving gown behind, and personal items no longer present in tx. Room. Pt. Was last seen in NAD and verbalized understanding of d/c and follow-up with MD while RN present in the room.

## 2016-07-05 ENCOUNTER — Encounter (HOSPITAL_COMMUNITY): Payer: Self-pay | Admitting: Physician Assistant

## 2016-07-06 LAB — CULTURE, URINE COMPREHENSIVE

## 2016-07-16 ENCOUNTER — Ambulatory Visit (INDEPENDENT_AMBULATORY_CARE_PROVIDER_SITE_OTHER): Payer: BLUE CROSS/BLUE SHIELD | Admitting: Obstetrics and Gynecology

## 2016-07-16 ENCOUNTER — Encounter: Payer: Self-pay | Admitting: Obstetrics and Gynecology

## 2016-07-16 VITALS — BP 124/72 | HR 72 | Resp 18 | Ht 62.0 in | Wt 131.0 lb

## 2016-07-16 DIAGNOSIS — G8929 Other chronic pain: Secondary | ICD-10-CM | POA: Diagnosis not present

## 2016-07-16 DIAGNOSIS — R102 Pelvic and perineal pain: Principal | ICD-10-CM

## 2016-07-16 DIAGNOSIS — N949 Unspecified condition associated with female genital organs and menstrual cycle: Secondary | ICD-10-CM

## 2016-07-16 MED ORDER — OXYCODONE-ACETAMINOPHEN 5-325 MG PO TABS: 1.0000 | ORAL_TABLET | Freq: Three times a day (TID) | ORAL | 0 refills | Status: DC | PRN

## 2016-07-16 MED ORDER — MEFENAMIC ACID 250 MG PO CAPS: 500.0000 mg | ORAL_CAPSULE | Freq: Four times a day (QID) | ORAL | 1 refills | Status: DC

## 2016-07-16 MED ORDER — LEVONORGEST-ETH EST & ETH EST 42-21-21-7 DAYS PO TABS: 1.0000 | ORAL_TABLET | Freq: Every day | ORAL | 3 refills | Status: DC

## 2016-07-16 NOTE — Progress Notes (Signed)
Obstetrics and Gynecology Visit Return Patient Evaluation  Appointment Date: 07/16/2016  OBGYN Clinic: [facility]  Primary Care Provider: Tommie Fernandez  Referring Provider: Tommie Sams, DO  Chief Complaint:  Follow up ER visit, abdominal pain  History of Present Illness: Connie Fernandez is a 20 y.o. Caucasian (Patient's last menstrual period was 06/26/2016 (exact date).), seen for the above chief complaint. She continues to have dysmenorrhea and is s/p recent august 2017 Dr. Marice Fernandez diagnostic laparoscopy which was negative. She went to the ER in late august for RLQ pain and had a negative pelvic u/s. She has tried multiple medical options to help with her pain, which she states only occurs with her periods.   Review of Systems: Her 12 point review of systems is negative or as noted in the History of Present Illness.  Patient Active Problem List   Diagnosis Date Noted  . Eosinophilia 02/13/2016  . Nausea with vomiting 02/13/2016  . Low back pain 11/23/2015  . Tobacco use disorder 10/28/2015  . Panic disorder 10/28/2015  . Major depressive disorder, single episode, moderate (HCC) 10/28/2015    Past Medical History:  Past Medical History:  Diagnosis Date  . Anemia   . Anxiety   . Depression   . Dysmenorrhea in adolescent   . Endometriosis   . Headache    Migraines  . Heart palpitations   . Shortness of breath dyspnea    with exertion  . UTI (lower urinary tract infection)     Past Surgical History:  Past Surgical History:  Procedure Laterality Date  . LAPAROSCOPY N/A 06/12/2016   Procedure: LAPAROSCOPY DIAGNOSTIC;  Surgeon: Connie Bossier, MD;  Location: [facility];  Service: Gynecology;  Laterality: N/A;    Past Obstetrical History:  OB History  No data available    Social History:  Social History   Social History  . Marital status: Single    Spouse name: N/A  . Number of children: N/A  . Years of education: N/A   Occupational History  . Not on file.    Social History Main Topics  . Smoking status: Current Every Day Smoker    Packs/day: 0.00    Years: 1.00    Types: Cigarettes, E-cigarettes  . Smokeless tobacco: Never Used  . Alcohol use No     Comment: occ  . Drug use: No  . Sexual activity: Yes    Partners: Male    Birth control/ protection: None   Other Topics Concern  . Not on file   Social History Narrative   Lives with mother, father, and 2 sisters    [employer]video editor and works Academic librarian    1 dog    Right handed    Enjoys working, Chief of Staff out with friends             Family History:  Family History  Problem Relation Age of Onset  . Diabetes Mother   . Alcohol abuse Father   . Hypertension Father   . COPD Maternal Grandmother   . Early death Maternal Grandmother 15  . Heart disease Maternal Grandmother   . Heart disease Maternal Grandfather 72  . Parkinsonism Paternal Grandmother     Medications [name] had no medications administered during this visit. Current Outpatient Prescriptions  Medication Sig Dispense Refill  . albuterol (PROVENTIL HFA) 108 (90 Base) MCG/ACT inhaler Inhale 2 puffs into the lungs every 6 (six) hours as needed for wheezing or shortness of breath. 1 Inhaler 1  . oxyCODONE-acetaminophen (  PERCOCET/ROXICET) 5-325 MG tablet Take 1-2 tablets by mouth every 6 (six) hours as needed. (Patient not taking: Reported on 07/04/2016) 20 tablet 0  . traMADol (ULTRAM) 50 MG tablet Take 1 tablet (50 mg total) by mouth every 6 (six) hours as needed. (Patient not taking: Reported on 07/04/2016) 60 tablet 0   No current facility-administered medications for this visit.     Allergies Latex and Penicillins   Physical Exam:  BP 124/72 (BP Location: Left Arm, Patient Position: Sitting, Cuff Size: Normal)   Pulse 72   Resp 18   Ht 5\' 2"  (1.575 m)   Wt 131 lb (59.4 kg)   LMP 06/26/2016 (Exact Date)   BMI 23.96 kg/m  Body mass index is 23.96 kg/m. General appearance: Well nourished, well  developed female in no acute distress.  Abd: soft, nttp. Umbilical incision well healed and old suture removed. No e/o infection or separation.    Laboratory: none  Radiology: as above  Assessment: stable CPP  Plan:  Pt states she only has pain with periods and that periods are heavy and qmonth. Last hornone use was when she had her nexplanon removed early this year. She states she has never tried continuous OCPs and recommended this which she is amenable to.  She has a follow up with Dr. Marice Potterove already scheduled for later this month and was told to keep this, as she states Dr. Marice Potterove had some potential other thoughts how to deal with the CPP. I told her that it'd be best to start the continuous OCPs right after she's had a period. I told her that I would give her a one time Rx for narcotics for this upcoming period and stressed her to use the mefenamic acid scheduled and then the narcotics only for severe pain; percocet 5/325 #10 given. I also told her to consider referral to a CPP speciality clinic if continuous OCPs dont work.   RTC two weeks  Connie Fernandez, Jr MD Attending Center for [facility](Faculty Practice)

## 2016-07-31 ENCOUNTER — Ambulatory Visit (INDEPENDENT_AMBULATORY_CARE_PROVIDER_SITE_OTHER): Payer: BLUE CROSS/BLUE SHIELD | Admitting: Obstetrics & Gynecology

## 2016-07-31 ENCOUNTER — Encounter: Payer: Self-pay | Admitting: Obstetrics & Gynecology

## 2016-07-31 VITALS — BP 126/77 | HR 84 | Wt 129.0 lb

## 2016-07-31 DIAGNOSIS — Z9889 Other specified postprocedural states: Secondary | ICD-10-CM

## 2016-07-31 DIAGNOSIS — R102 Pelvic and perineal pain: Secondary | ICD-10-CM

## 2016-07-31 NOTE — Progress Notes (Signed)
   Subjective:    Patient ID: Wyatt PortelaJessica Carthen, female    DOB: 12-21-1995, 20 y.o.   MRN: [id number]  HPI 20 yo engaged WP0 here for a post op visit. She has dyspareunia. Her laparoscopy was done on 06/12/16 and was entirely normal. She was seen for post op pain about 2 weeks later. Labs and u/s were normal at that time. She has tried OCP, depo provera, Nexplanon for pelvic pain, all with no help.   Review of Systems     Objective:   Physical Exam  [id number] Breathing, conversing, and ambulating normally Incision- healed well Abd- benign      Assessment & Plan:  Chronic pelvic pain with negative laparoscopy Rec urology referral for possible IC I have stressed that chronic narcotic use is not the best answer for her and that if she needs extended narcotics, she will need to get them through a pain management clinic.

## 2016-08-01 ENCOUNTER — Ambulatory Visit (INDEPENDENT_AMBULATORY_CARE_PROVIDER_SITE_OTHER): Payer: BLUE CROSS/BLUE SHIELD | Admitting: Internal Medicine

## 2016-08-01 ENCOUNTER — Telehealth: Payer: Self-pay | Admitting: Family Medicine

## 2016-08-01 ENCOUNTER — Telehealth: Payer: Self-pay

## 2016-08-01 ENCOUNTER — Encounter: Payer: Self-pay | Admitting: Internal Medicine

## 2016-08-01 VITALS — BP 120/64 | HR 83 | Temp 98.2°F | Wt 128.2 lb

## 2016-08-01 DIAGNOSIS — R1012 Left upper quadrant pain: Secondary | ICD-10-CM | POA: Diagnosis not present

## 2016-08-01 NOTE — Patient Instructions (Signed)

## 2016-08-01 NOTE — Telephone Encounter (Signed)
FYI - Pt mom Misty StanleyLisa called stating the daughter is at work and having a sharp pain under her breast. Mother in unsure of which side it is on. I sent the call to the triage. Mom called the office back asking for an appointment for today or tomorrow, explained that we did not have any appointment open. Offered patient appointment at [facility] today at 2:15 with Dr. Sampson SiBaity, they took the appointment.

## 2016-08-01 NOTE — Telephone Encounter (Signed)
Pt was seen by Nicki Reaperegina Baity today---pt request restarting Zoloft that she was on back in 10/2015---last OV with you was 06/2016--please advise

## 2016-08-01 NOTE — Telephone Encounter (Signed)
FYI

## 2016-08-01 NOTE — Progress Notes (Signed)
Subjective:    Patient ID: Connie Fernandez, female    DOB: [birth year], 20 y.o.   MRN: [id number]  HPI   Pt presents to the clinic today with c/o left upper quadrant pain. She reports she first noticed it this morning. She describes the pain as sharp. Eating seemed to make it worse. The pain lasted for about 1 hour and subsided without intervention. She has not taken any medications OTC for this. She denies urinary or vaginal complaints. She has had similar pain in the past exacerbated by running. She does admit to starting new activities at the gym on Friday and has since been to the gym again this past Sunday. She denies injury or trauma to the area.   Review of Systems  Past Medical History:  Diagnosis Date  . Anemia   . Anxiety   . Depression   . Dysmenorrhea in adolescent   . Endometriosis   . Headache    Migraines  . Heart palpitations   . Shortness of breath dyspnea    with exertion  . UTI (lower urinary tract infection)     Current Outpatient Prescriptions  Medication Sig Dispense Refill  . Levonorgest-Eth Est & Eth Est (QUARTETTE) 42-21-21-7 DAYS TABS Take 1 tablet by mouth daily. 91 tablet 3  . oxyCODONE-acetaminophen (PERCOCET/ROXICET) 5-325 MG tablet Take 1-2 tablets by mouth every 6 (six) hours as needed. 20 tablet 0  . oxyCODONE-acetaminophen (PERCOCET/ROXICET) 5-325 MG tablet Take 1 tablet by mouth every 8 (eight) hours as needed for severe pain. 10 tablet 0  . albuterol (PROVENTIL HFA) 108 (90 Base) MCG/ACT inhaler Inhale 2 puffs into the lungs every 6 (six) hours as needed for wheezing or shortness of breath. (Patient not taking: Reported on 08/01/2016) 1 Inhaler 1   No current facility-administered medications for this visit.     Allergies  Allergen Reactions  . Latex Itching  . Penicillins Hives    Has patient had a PCN reaction causing immediate rash, facial/tongue/throat swelling, SOB or lightheadedness with hypotension: Dizziness and shaky. Has patient  had a PCN reaction causing severe rash involving mucus membranes or skin necrosis: NO Has patient had a PCN reaction that required hospitalization: NO Has patient had a PCN reaction occurring within the last 10 years: NO If all of the above answers are "NO", then may proceed with Cephalosporin use.     Family History  Problem Relation Age of Onset  . Diabetes Mother   . Alcohol abuse Father   . Hypertension Father   . COPD Maternal Grandmother   . Early death Maternal Grandmother [id number]  . Heart disease Maternal Grandmother   . Heart disease Maternal Grandfather 72  . Parkinsonism Paternal Grandmother     Social History   Social History  . Marital status: Single    Spouse name: N/A  . Number of children: N/A  . Years of education: N/A   Occupational History  . Not on file.   Social History Main Topics  . Smoking status: Current Every Day Smoker    Packs/day: 0.00    Years: 1.00    Types: Cigarettes, E-cigarettes  . Smokeless tobacco: Never Used  . Alcohol use No     Comment: occ  . Drug use: No  . Sexual activity: Yes    Partners: Male    Birth control/ protection: None   Other Topics Concern  . Not on file   Social History Narrative   Lives with mother, father, and 2 sisters  [employer] Film/video editor and works Academic librarian    1 dog    Right handed    Enjoys working, hanging out with friends              Constitutional: Denies fever, malaise, fatigue, headache or abrupt weight changes.  Respiratory: Denies difficulty breathing, shortness of breath, cough or sputum production.   Cardiovascular: Denies chest pain, chest tightness, palpitations or swelling in the hands or feet.  Gastrointestinal: Pt reports left upper quadrant abdominal pain. Denies bloating, constipation, diarrhea or blood in the stool.  GU: Denies urgency, frequency, pain with urination, burning sensation, blood in urine, odor or discharge. Musculoskeletal: Denies decrease in range of motion,  difficulty with gait, joint pain and swelling.  Skin: Denies redness, rashes, lesions or ulcercations.    No other specific complaints in a complete review of systems (except as listed in HPI above).    Objective:   Physical Exam BP 120/64   Pulse 83   Temp 98.2 F (36.8 C) (Oral)   Wt 128 lb 4 oz (58.2 kg)   LMP 06/24/2016   SpO2 98%   BMI 23.46 kg/m  Wt Readings from Last 3 Encounters:  08/01/16 128 lb 4 oz (58.2 kg)  07/31/16 129 lb (58.5 kg)  07/16/16 131 lb (59.4 kg)    General: Appears her stated age, well developed, well nourished in NAD. Skin: Warm, dry and intact. No rashes, lesions or ulcerations noted. Cardiovascular: Normal rate and rhythm. S1,S2 noted.  No murmur, rubs or gallops noted.  Pulmonary/Chest: Normal effort and positive vesicular breath sounds. No respiratory distress. No wheezes, rales or ronchi noted.  Abdomen: Soft with left upper quadrant tenderness. Negative Murphy's and McBurney's signs. No rebound tenderness. Normal bowel sounds. No distention or masses noted. Liver, spleen and kidneys non palpable. Musculoskeletal: Normal range of motion. No signs of joint swelling. No difficulty with gait.     BMET    Component Value Date/Time   NA 138 07/04/2016 0443   NA 141 05/30/2015   K 3.8 07/04/2016 0443   CL 108 07/04/2016 0443   CO2 26 07/04/2016 0443   GLUCOSE 108 (H) 07/04/2016 0443   BUN 9 07/04/2016 0443   BUN 12 05/30/2015   CREATININE 0.63 07/04/2016 0443   CREATININE 0.78 07/02/2016 1541   CALCIUM 9.2 07/04/2016 0443   GFRNONAA >60 07/04/2016 0443   GFRAA >60 07/04/2016 0443    Lipid Panel  No results found for: CHOL, TRIG, HDL, CHOLHDL, VLDL, LDLCALC  CBC    Component Value Date/Time   WBC 11.3 (H) 07/04/2016 0443   RBC 4.53 07/04/2016 0443   HGB 13.6 07/04/2016 0443   HCT 39.1 07/04/2016 0443   PLT 223 07/04/2016 0443   MCV 86.2 07/04/2016 0443   MCH 29.9 07/04/2016 0443   MCHC 34.7 07/04/2016 0443   RDW 15.7 (H)  07/04/2016 0443   LYMPHSABS 1,408 07/02/2016 1541   MONOABS 384 07/02/2016 1541   EOSABS 576 (H) 07/02/2016 1541   BASOSABS 128 07/02/2016 1541    Hgb A1C No results found for: HGBA1C          Assessment & Plan:   Left upper quadrant pain  This has resolved Can use a heating pad for comfort  If pain occurs again, try stretching exercises for relief Avoid Ibuprofen at this time, in case this is GI related Work note provided  RTC as needed or if symptoms persist or worsen Lyrah Bradt, NP

## 2016-08-02 NOTE — Telephone Encounter (Signed)
Patient hasn't been seen for this issue in several months. I would suggest follow-up prior to starting back on medication.

## 2016-08-03 NOTE — Telephone Encounter (Signed)
Ok. I called pt and left a vm. Thank you!

## 2016-08-03 NOTE — Telephone Encounter (Signed)
Can you schedule a follow up appt with Dr. Adriana Simasook for this patient for Med management, thanks

## 2016-08-10 IMAGING — US US TRANSVAGINAL NON-OB
1 series · 13 of 25 positions shown · non-contrast
Comparison: Prior ultrasound from 12/22/2014.

CLINICAL DATA: Initial evaluation for acute pelvic pain with
vaginal bleeding for 1 day.

EXAM:
TRANSABDOMINAL AND TRANSVAGINAL ULTRASOUND OF PELVIS
DOPPLER ULTRASOUND OF OVARIES
TECHNIQUE: Both transabdominal and transvaginal ultrasound examinations of the
pelvis were performed. Transabdominal technique was performed for
global imaging of the pelvis including uterus, ovaries, adnexal
regions, and pelvic cul-de-sac.
It was necessary to proceed with endovaginal exam following the
transabdominal exam to visualize the uterus and ovaries. Color and
duplex Doppler ultrasound was utilized to evaluate blood flow to the
ovaries.

[Series 1: us transvaginal non-ob · 0.20mm/px · 13 of 62 slices shown]
[im 1/62]
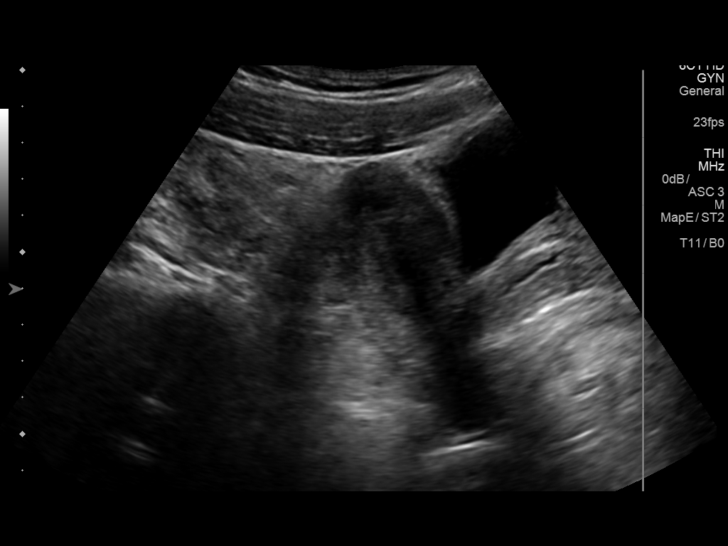
[im 6/62]
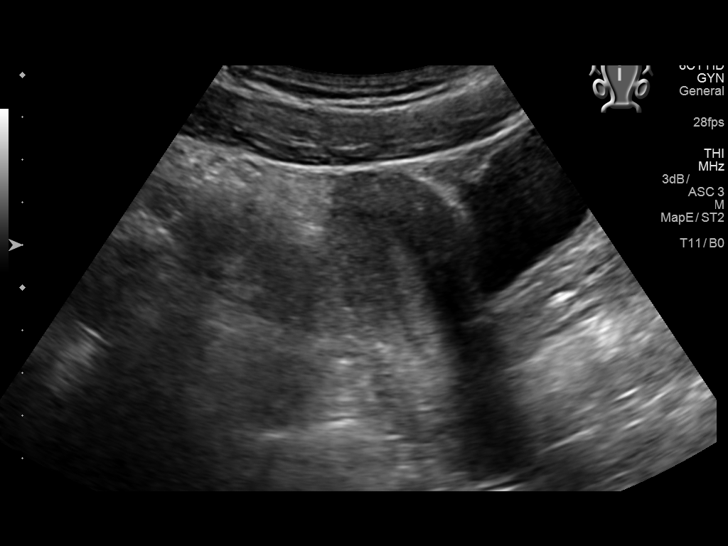
[im 11/62]
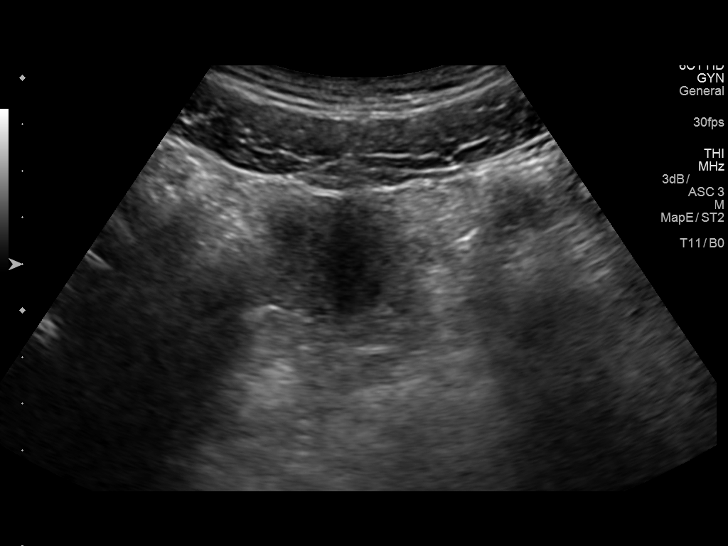
[im 16/62]
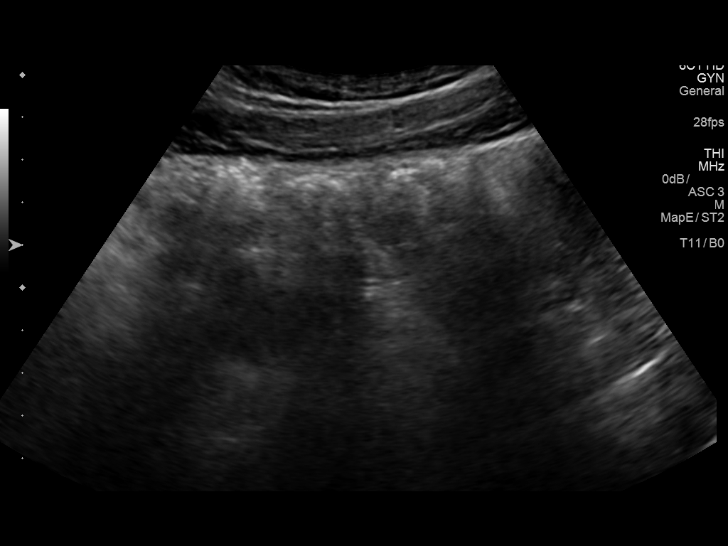
[im 21/62]
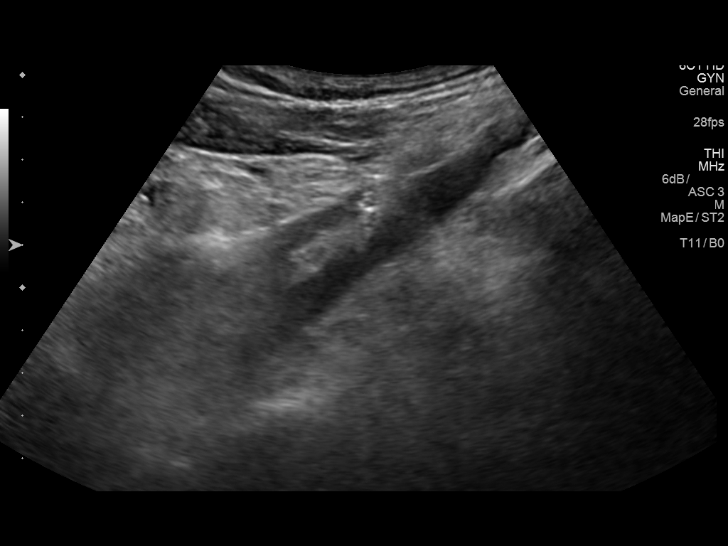
[im 26/62]
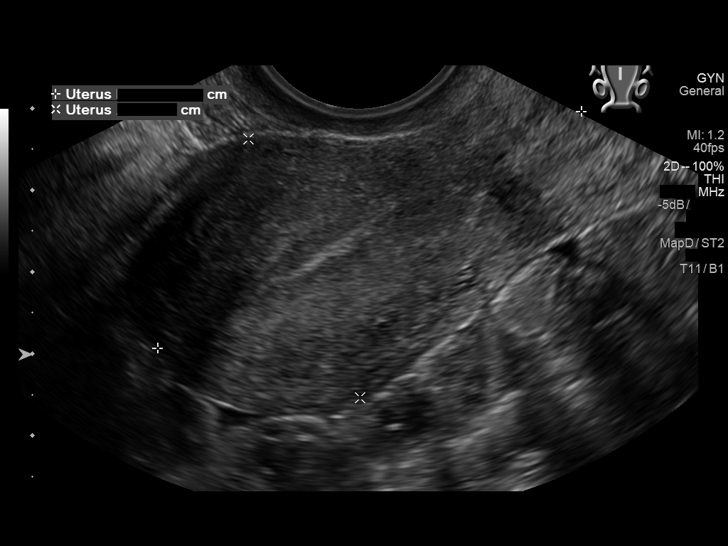
[im 31/62]
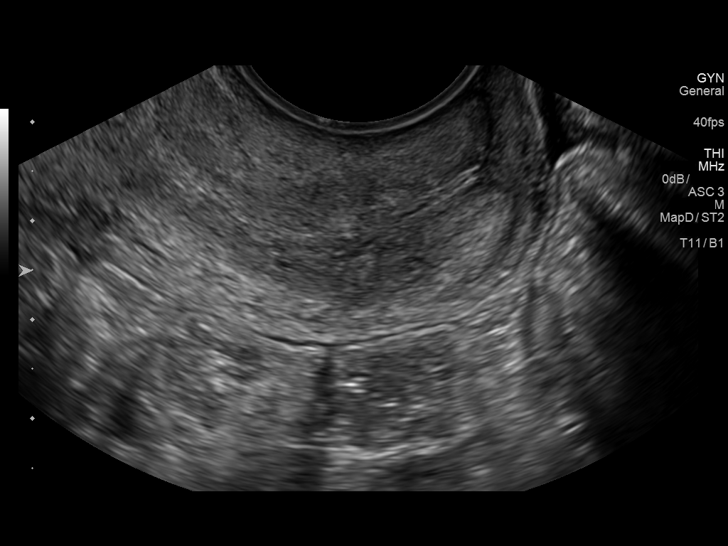
[im 36/62]
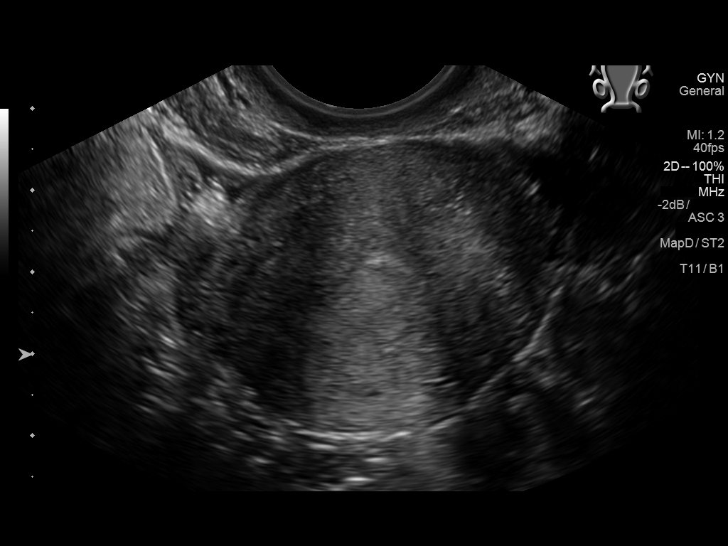
[im 41/62]
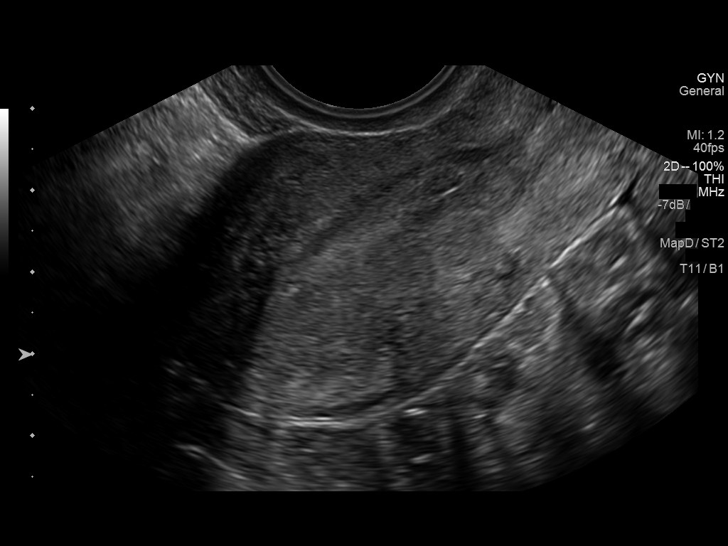
[im 46/62]
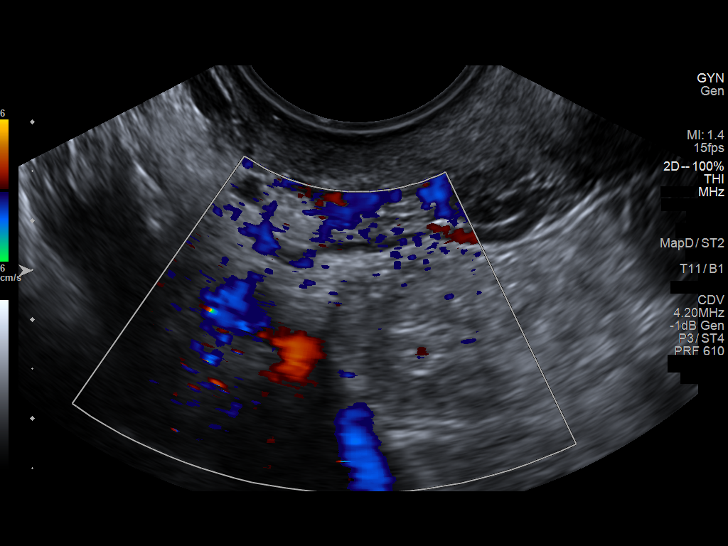
[im 51/62]
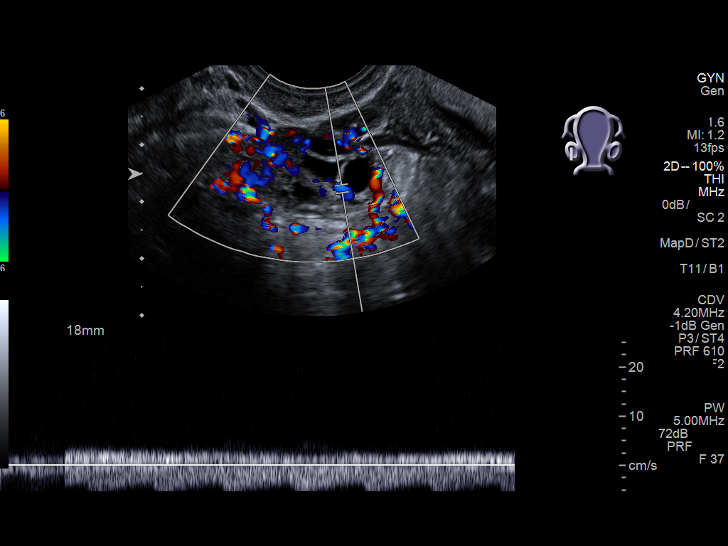
[im 56/62]
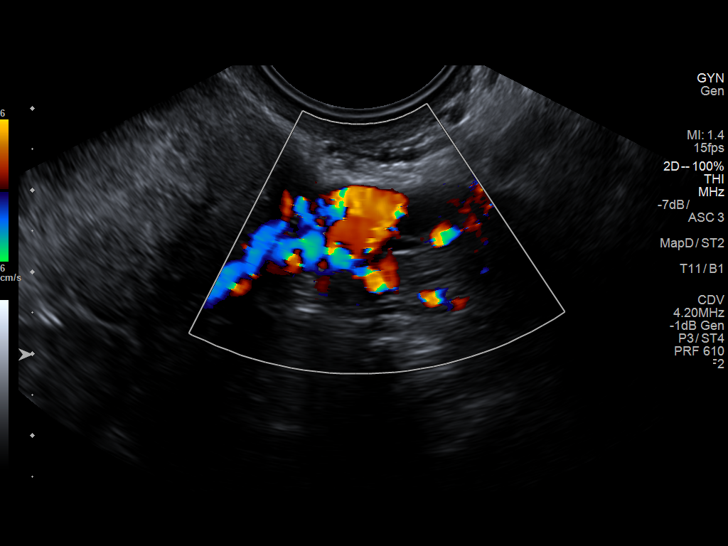
[im 62/62]
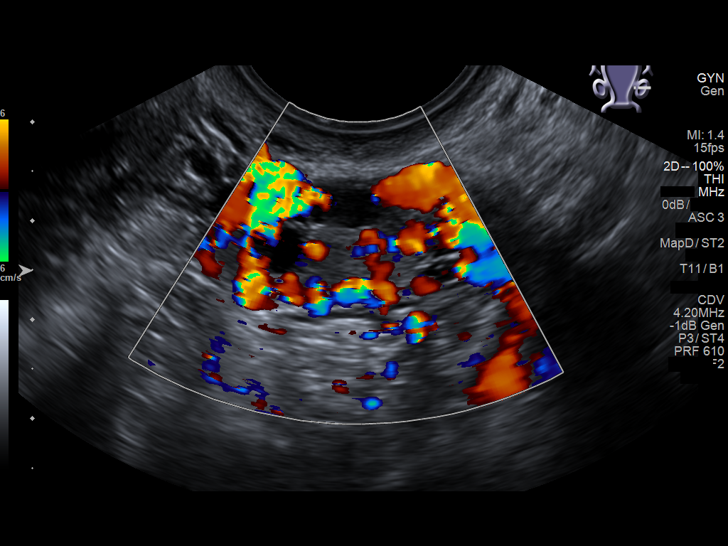

[13 of 25 positions shown; findings below may reference images not displayed]

FINDINGS: Uterus

Measurements: 6.3 x 3.3 x 3.8 cm. No fibroids or other mass
visualized.

Endometrium

Thickness: 5.1 mm.  No focal abnormality visualized.

Right ovary

Measurements: 2.8 x 1.5 x 1.8 cm. Normal appearance/no adnexal mass.

Left ovary

Measurements: 2.5 x 1.3 x 2.1 cm. Normal appearance/no adnexal mass.

Pulsed Doppler evaluation of both ovaries demonstrates normal
low-resistance arterial and venous waveforms.

Other findings

Trace free fluid.
IMPRESSION: 1. Trace free pelvic fluid, presumably physiologic.
2. Otherwise negative pelvic ultrasound. Normal appearance of the
uterus and ovaries. No evidence for torsion.

## 2016-08-15 ENCOUNTER — Encounter: Payer: Self-pay | Admitting: Urology

## 2016-08-15 ENCOUNTER — Ambulatory Visit: Payer: BLUE CROSS/BLUE SHIELD | Admitting: Urology

## 2016-09-06 ENCOUNTER — Ambulatory Visit (INDEPENDENT_AMBULATORY_CARE_PROVIDER_SITE_OTHER): Payer: BLUE CROSS/BLUE SHIELD | Admitting: Psychology

## 2016-09-06 DIAGNOSIS — F321 Major depressive disorder, single episode, moderate: Secondary | ICD-10-CM | POA: Diagnosis not present

## 2016-09-12 ENCOUNTER — Telehealth: Payer: Self-pay | Admitting: Obstetrics & Gynecology

## 2016-09-12 DIAGNOSIS — R102 Pelvic and perineal pain: Principal | ICD-10-CM

## 2016-09-12 DIAGNOSIS — G8929 Other chronic pain: Secondary | ICD-10-CM

## 2016-09-12 NOTE — Telephone Encounter (Signed)
Pt's mother called wondering if she could get a rx for a couple of pain pills to help her get through the first couple of days of her cycle. Pt uses the [facility] on [address] in [place]. Mom states patient needs rx to be called in today or sometime tomorrow morning, since she's about to start her cycle. Please advise

## 2016-09-13 IMAGING — US US PELVIS COMPLETE
1 series · 14 of 25 positions shown · non-contrast
Comparison: 05/31/2016

CLINICAL DATA: Right lower quadrant pain for 4 days.

EXAM:
TRANSABDOMINAL AND TRANSVAGINAL ULTRASOUND OF PELVIS
TECHNIQUE: Both transabdominal and transvaginal ultrasound examinations of the
pelvis were performed. Transabdominal technique was performed for
global imaging of the pelvis including uterus, ovaries, adnexal
regions, and pelvic cul-de-sac. It was necessary to proceed with
endovaginal exam following the transabdominal exam to visualize the
ovaries and endometrium.

[Series 1: us pelvis complete · 0.20mm/px · 14 of 60 slices shown]
[im 1/60]
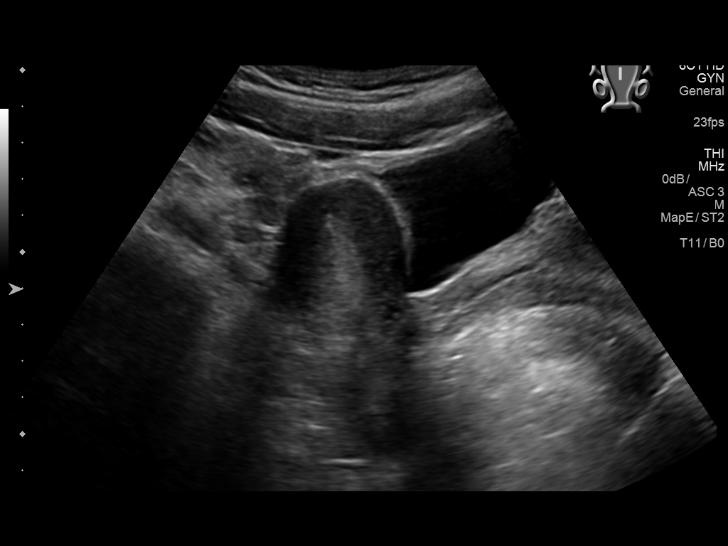
[im 5/60]
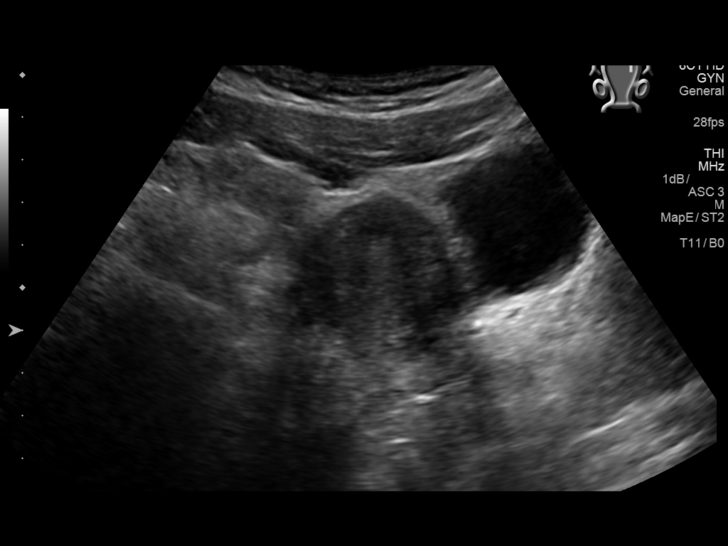
[im 10/60]
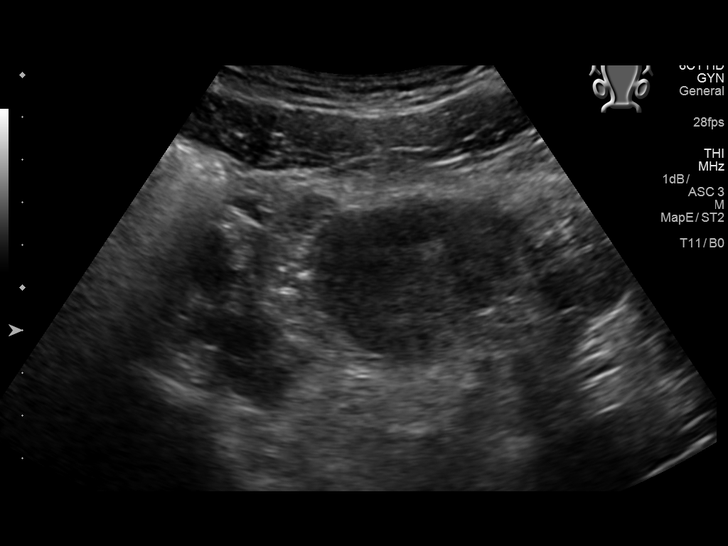
[im 15/60]
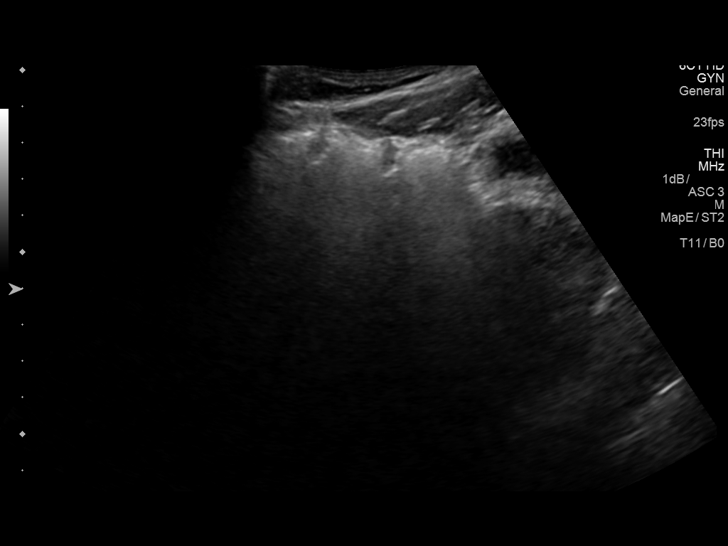
[im 20/60]
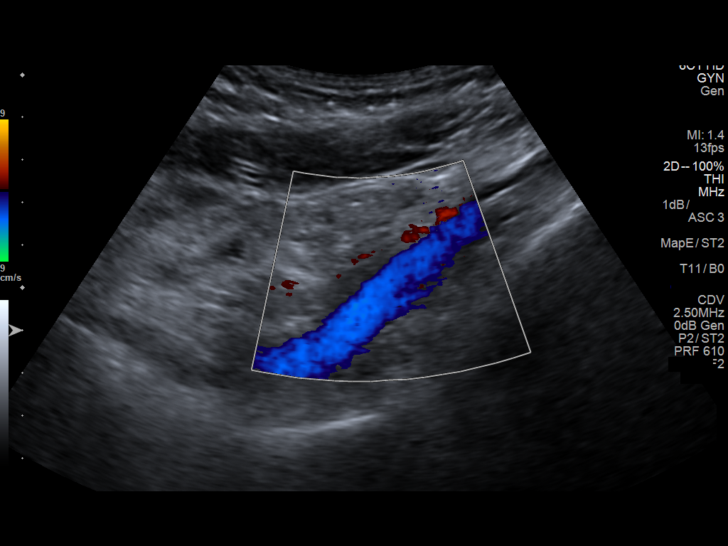
[im 23/60]
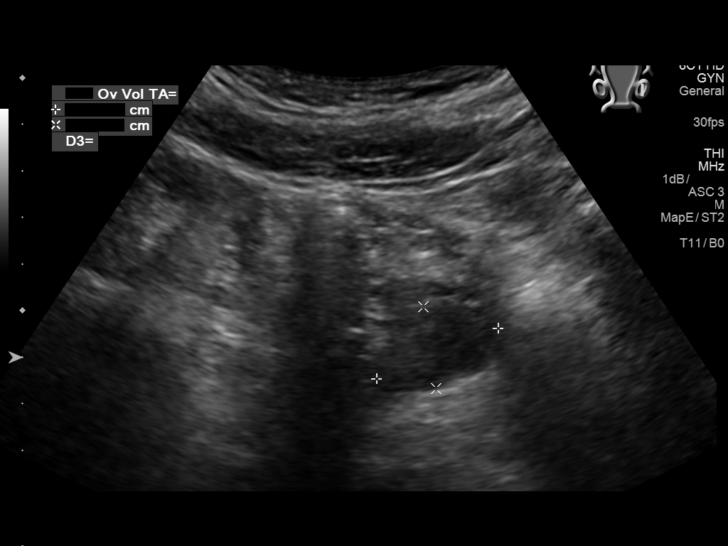
[im 28/60]
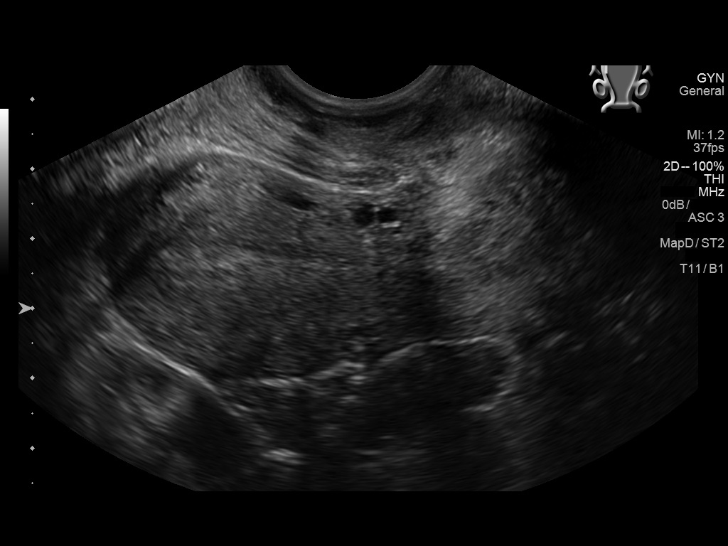
[im 32/60]
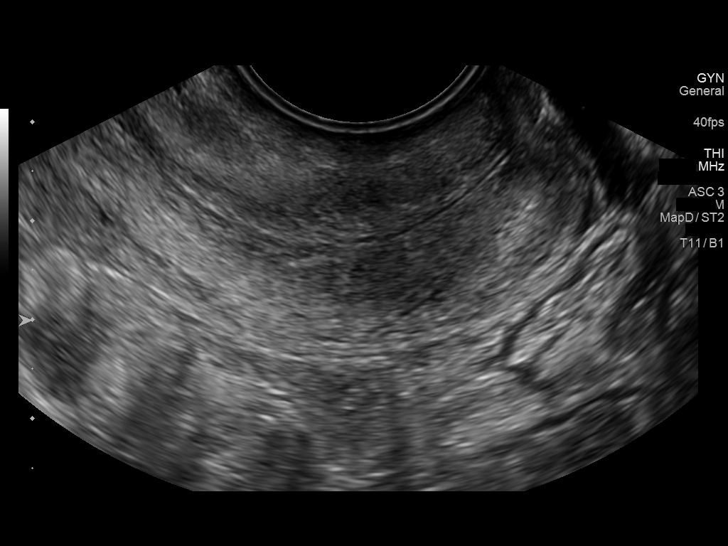
[im 37/60]
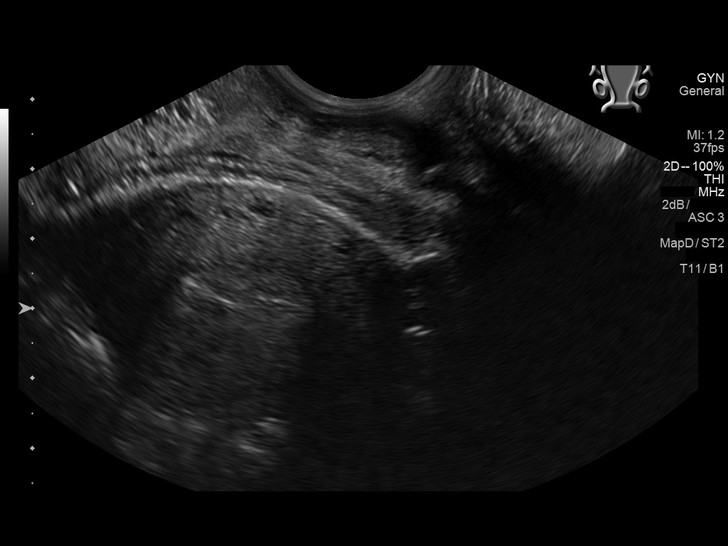
[im 40/60]
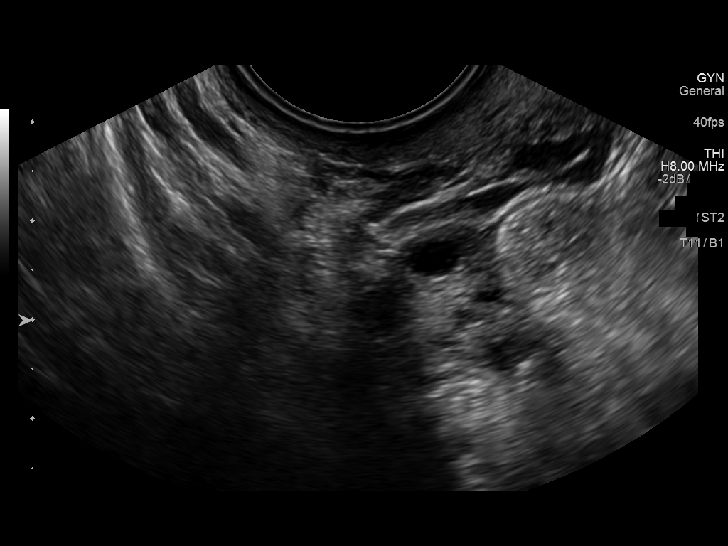
[im 45/60]
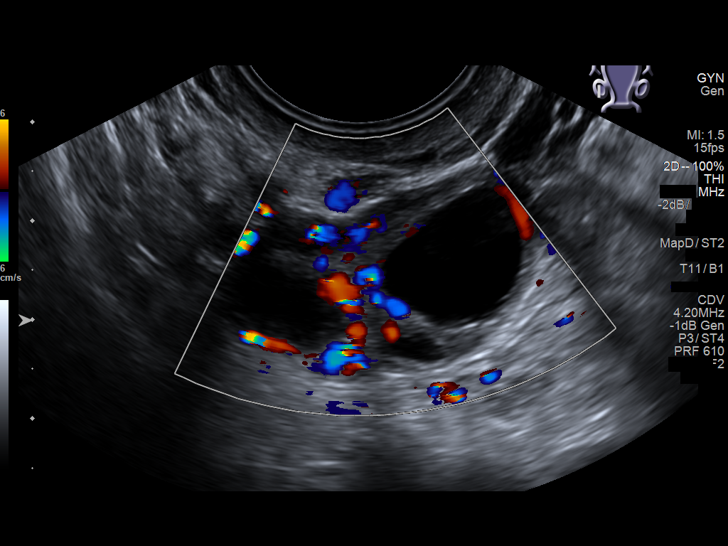
[im 50/60]
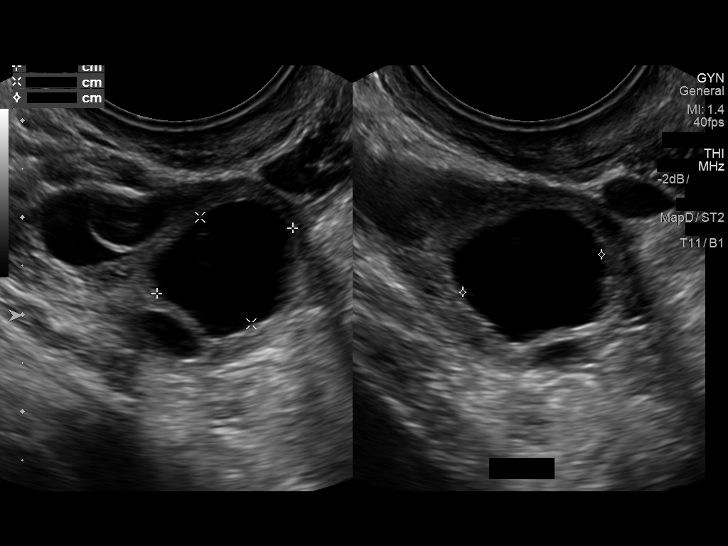
[im 55/60]
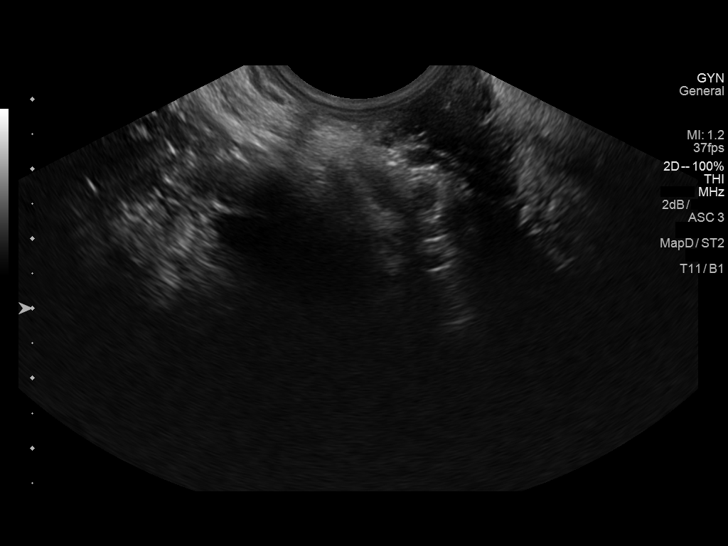
[im 60/60]
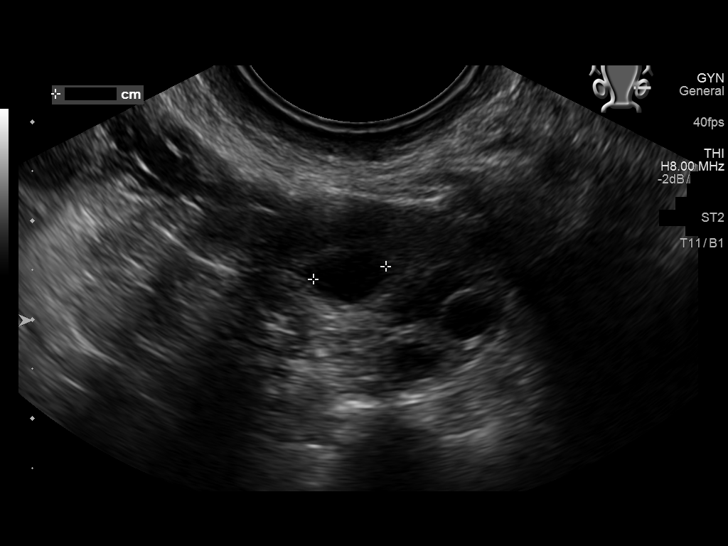

[14 of 25 positions shown; findings below may reference images not displayed]

FINDINGS: Uterus

Measurements: 6.8 x 3 x 4.9 cm. Uterus is anteverted. No fibroids or
other mass visualized.

Endometrium

Thickness: 7.5 mm.  No focal abnormality visualized.

Right ovary

Measurements: 3.3 x 1.7 x 2.7 cm. Normal follicular cysts. Normal
appearance/no adnexal mass.

Left ovary

Measurements: 2.8 x 1.7 x 1.9 cm. Normal appearance/no adnexal mass.

Other findings

No abnormal free fluid.
IMPRESSION: Normal ultrasound appearance of the uterus and ovaries.

## 2016-09-13 MED ORDER — DICLOFENAC SODIUM 75 MG PO TBEC: 75.0000 mg | DELAYED_RELEASE_TABLET | Freq: Two times a day (BID) | ORAL | 1 refills | Status: DC

## 2016-09-13 NOTE — Addendum Note (Signed)
Addended by: Gita KudoLASSITER, KRISTEN S on: 09/13/2016 01:59 PM   Modules accepted: Orders

## 2016-09-13 NOTE — Telephone Encounter (Signed)
Returned call, no answer, left message.

## 2016-09-13 NOTE — Telephone Encounter (Signed)
Pt returned call, informed her that we could no longer send any narcotic pain medication and she would need to be referred to a pain clinic or pelvic health clinic for evaluation and continued management of her symptoms. Sent Diclofenac to the pharmacy and instructed pt on medication use.

## 2016-10-02 ENCOUNTER — Ambulatory Visit (INDEPENDENT_AMBULATORY_CARE_PROVIDER_SITE_OTHER): Payer: BLUE CROSS/BLUE SHIELD | Admitting: Psychology

## 2016-10-02 DIAGNOSIS — F321 Major depressive disorder, single episode, moderate: Secondary | ICD-10-CM

## 2016-10-05 DIAGNOSIS — M6289 Other specified disorders of muscle: Secondary | ICD-10-CM

## 2016-10-05 DIAGNOSIS — N944 Primary dysmenorrhea: Secondary | ICD-10-CM

## 2016-10-05 DIAGNOSIS — R102 Pelvic and perineal pain: Secondary | ICD-10-CM | POA: Diagnosis not present

## 2016-10-05 HISTORY — DX: Other specified disorders of muscle: M62.89

## 2016-10-05 HISTORY — DX: Primary dysmenorrhea: N94.4

## 2016-10-09 ENCOUNTER — Ambulatory Visit (INDEPENDENT_AMBULATORY_CARE_PROVIDER_SITE_OTHER): Payer: BLUE CROSS/BLUE SHIELD | Admitting: Psychology

## 2016-10-09 DIAGNOSIS — F321 Major depressive disorder, single episode, moderate: Secondary | ICD-10-CM | POA: Diagnosis not present

## 2016-10-11 ENCOUNTER — Telehealth: Payer: Self-pay | Admitting: *Deleted

## 2016-10-11 NOTE — Telephone Encounter (Signed)
Called pt, no answer, unable to leave message.

## 2016-10-11 NOTE — Telephone Encounter (Signed)
-----   Message from Olevia BowensJacinda S Battle sent at 10/09/2016  4:54 PM EST ----- Regarding: Refill Just FYI pt mother "walk-in" asking about a refill of oxy, saw your last note from 11/08. Informed her that we would no longer refill her pain medicine, and that she needed to follow up w/ the pelvic pain clinic we referred her to

## 2016-10-16 ENCOUNTER — Ambulatory Visit (INDEPENDENT_AMBULATORY_CARE_PROVIDER_SITE_OTHER): Payer: BLUE CROSS/BLUE SHIELD | Admitting: Psychology

## 2016-10-16 DIAGNOSIS — F321 Major depressive disorder, single episode, moderate: Secondary | ICD-10-CM

## 2016-10-23 ENCOUNTER — Ambulatory Visit (INDEPENDENT_AMBULATORY_CARE_PROVIDER_SITE_OTHER): Payer: BLUE CROSS/BLUE SHIELD | Admitting: Psychology

## 2016-10-23 DIAGNOSIS — F321 Major depressive disorder, single episode, moderate: Secondary | ICD-10-CM | POA: Diagnosis not present

## 2016-10-25 ENCOUNTER — Encounter: Payer: Self-pay | Admitting: Family Medicine

## 2016-10-25 ENCOUNTER — Ambulatory Visit (INDEPENDENT_AMBULATORY_CARE_PROVIDER_SITE_OTHER): Payer: BLUE CROSS/BLUE SHIELD | Admitting: Family Medicine

## 2016-10-25 DIAGNOSIS — F331 Major depressive disorder, recurrent, moderate: Secondary | ICD-10-CM

## 2016-10-25 MED ORDER — CITALOPRAM HYDROBROMIDE 20 MG PO TABS: 20.0000 mg | ORAL_TABLET | Freq: Every day | ORAL | 1 refills | Status: DC

## 2016-10-25 NOTE — Patient Instructions (Signed)
Start the celexa.  Follow up in 6 weeks.  Merry Christmas.   Take care  Dr. Adriana Simasook

## 2016-10-25 NOTE — Progress Notes (Signed)
Subjective:  Patient ID: Connie PortelaJessica Prochnow, female    DOB: 15-Feb-1996  Age: 20 y.o. MRN: [id number]  CC: Depression  HPI:  20 year old female with a history of depression and anxiety presents with complaints of depression.  Patient has a prior history of major depressive disorder and anxiety. Was admitted in 2016 after found to be self mutilating. She was discharged on Zoloft. She subsequently stopped taking the medication due to side effects. She states that it made her dizzy and she seemed "lazy".  She reports recent issues with depression. She states that she is having issues with sleep as well as feeling down and depressed. She feels anxious as well. She has been seeing our psychologist who has recently touched base with me. She has recommend that I start her on an antidepressant. Patient desires this as well. She is hopeful this will be of benefit. No recent cutting.  No recent stressors. Still in relationship with girlfriend.  Social Hx  Social History   Social History  . Marital status: Single    Spouse name: N/A  . Number of children: N/A  . Years of education: N/A   Social History Main Topics  . Smoking status: Current Every Day Smoker    Packs/day: 0.00    Years: 1.00    Types: Cigarettes, E-cigarettes  . Smokeless tobacco: Never Used  . Alcohol use No     Comment: occ  . Drug use: No  . Sexual activity: Yes    Partners: Male    Birth control/ protection: None   Other Topics Concern  . None   Social History Narrative   Lives with mother, father, and 2 sisters    [employer] and works [employer]    1 dog    Right handed    Enjoys working, Chief of Staffhanging out with friends            Review of Systems  Constitutional: Negative.   Psychiatric/Behavioral: The patient is nervous/anxious.        Depression.   Objective:  BP 116/72   Pulse 80   Temp 97.3 F (36.3 C) (Oral)   Resp 12   Wt 133 lb (60.3 kg)   SpO2 100%   BMI 24.33 kg/m   BP/Weight  10/25/2016 08/01/2016 07/31/2016  Systolic BP 116 120 126  Diastolic BP 72 64 77  Wt. (Lbs) 133 128.25 129  BMI 24.33 23.46 23.59   Physical Exam  Constitutional: She is oriented to person, place, and time. She appears well-developed. No distress.  Cardiovascular: Normal rate and regular rhythm.   2/6 systolic murmur.   Pulmonary/Chest: Effort normal and breath sounds normal.  Neurological: She is alert and oriented to person, place, and time.  Psychiatric:  Flat affect, depressed.  Vitals reviewed.   Lab Results  Component Value Date   WBC 11.3 (H) 07/04/2016   HGB 13.6 07/04/2016   HCT 39.1 07/04/2016   PLT 223 07/04/2016   GLUCOSE 108 (H) 07/04/2016   ALT 18 07/04/2016   AST 19 07/04/2016   NA 138 07/04/2016   K 3.8 07/04/2016   CL 108 07/04/2016   CREATININE 0.63 07/04/2016   BUN 9 07/04/2016   CO2 26 07/04/2016   TSH 1.71 02/03/2016    Assessment & Plan:   Problem List Items Addressed This Visit    Major depressive disorder, recurrent episode (HCC)    Worsening as of late. Starting Celexa. Continue seeing psychology. Follow up in 6 weeks.  Relevant Medications   citalopram (CELEXA) 20 MG tablet      Meds ordered this encounter  Medications  . citalopram (CELEXA) 20 MG tablet    Sig: Take 1 tablet (20 mg total) by mouth daily.    Dispense:  90 tablet    Refill:  1    Follow-up: 6 weeks  Everlene OtherJayce Allison Silva DO [facility] [place]

## 2016-10-25 NOTE — Progress Notes (Signed)
Pre visit review using our clinic review tool, if applicable. No additional management support is needed unless otherwise documented below in the visit note. 

## 2016-10-25 NOTE — Assessment & Plan Note (Signed)
Worsening as of late. Starting Celexa. Continue seeing psychology. Follow up in 6 weeks.

## 2016-11-13 ENCOUNTER — Ambulatory Visit (INDEPENDENT_AMBULATORY_CARE_PROVIDER_SITE_OTHER): Payer: BLUE CROSS/BLUE SHIELD | Admitting: Psychology

## 2016-11-13 DIAGNOSIS — F321 Major depressive disorder, single episode, moderate: Secondary | ICD-10-CM | POA: Diagnosis not present

## 2016-11-20 ENCOUNTER — Ambulatory Visit: Payer: BLUE CROSS/BLUE SHIELD | Admitting: Psychology

## 2016-11-27 ENCOUNTER — Ambulatory Visit: Payer: Self-pay | Admitting: Psychology

## 2016-12-11 ENCOUNTER — Ambulatory Visit (INDEPENDENT_AMBULATORY_CARE_PROVIDER_SITE_OTHER): Payer: BLUE CROSS/BLUE SHIELD | Admitting: Psychology

## 2016-12-11 DIAGNOSIS — F321 Major depressive disorder, single episode, moderate: Secondary | ICD-10-CM | POA: Diagnosis not present

## 2016-12-25 ENCOUNTER — Ambulatory Visit: Payer: BLUE CROSS/BLUE SHIELD | Admitting: Psychology

## 2017-02-19 ENCOUNTER — Ambulatory Visit (INDEPENDENT_AMBULATORY_CARE_PROVIDER_SITE_OTHER): Payer: BLUE CROSS/BLUE SHIELD | Admitting: Family Medicine

## 2017-02-19 ENCOUNTER — Encounter: Payer: Self-pay | Admitting: Family Medicine

## 2017-02-19 DIAGNOSIS — N946 Dysmenorrhea, unspecified: Secondary | ICD-10-CM | POA: Insufficient documentation

## 2017-02-19 DIAGNOSIS — L700 Acne vulgaris: Secondary | ICD-10-CM | POA: Diagnosis not present

## 2017-02-19 DIAGNOSIS — L709 Acne, unspecified: Secondary | ICD-10-CM | POA: Insufficient documentation

## 2017-02-19 DIAGNOSIS — F17200 Nicotine dependence, unspecified, uncomplicated: Secondary | ICD-10-CM | POA: Diagnosis not present

## 2017-02-19 HISTORY — DX: Acne, unspecified: L70.9

## 2017-02-19 HISTORY — DX: Dysmenorrhea, unspecified: N94.6

## 2017-02-19 NOTE — Assessment & Plan Note (Signed)
Advised to cut back and try and quit on her on. Did not want to proceed with requested chantix given psychiatric history. Patient to consider Wellbutrin.

## 2017-02-19 NOTE — Progress Notes (Signed)
Pre visit review using our clinic review tool, if applicable. No additional management support is needed unless otherwise documented below in the visit note. 

## 2017-02-19 NOTE — Assessment & Plan Note (Signed)
New problem (to me). Likely needs accutane. Referring to derm.

## 2017-02-19 NOTE — Patient Instructions (Signed)
Cut back on the smoking. Consider Wellbutrin.  We will refer you to another dermatologist.  Take care  Dr. Adriana Simas

## 2017-02-19 NOTE — Assessment & Plan Note (Signed)
Needs to see GYN. Note for work given.

## 2017-02-19 NOTE — Progress Notes (Signed)
Subjective:  Patient ID: Connie Fernandez, female    DOB: 1996-09-22  Age: 21 y.o. MRN: [id number]  CC: Acne, Pelvic pain/dysmenorrhea, Tobacco abuse  HPI:  21 year old female presents with the above complaints.  Acne  This is an ongoing issue.  She has been seen by dermatology. She states that she needs her "hormone levels" checked.  She states that she's been on several topicals and has been on an oral medication which sounds like an antibiotic.  She was scheduled to start on Accutane but did not follow through.  She would like to discuss this today.  Dysmenorrhea  Chronic problem.  Patient states that she misses work at least one day a month due to severe dysmenorrhea.  She would like a note to reflect this.  She states that she's been on several birth controls was including Depo, OCP's and Nexplanon.   She's also seen a specialist and has had a laparoscopy which did not reveal endometriosis.  Tobacco abuse  Patient was to quit smoking.  She would like to discuss Chantix today.  She currently smokes 1 pack a day.  Social Hx   Social History   Social History  . Marital status: Single    Spouse name: N/A  . Number of children: N/A  . Years of education: N/A   Social History Main Topics  . Smoking status: Current Every Day Smoker    Packs/day: 0.00    Years: 1.00    Types: Cigarettes, E-cigarettes  . Smokeless tobacco: Never Used  . Alcohol use No     Comment: occ  . Drug use: No  . Sexual activity: Yes    Partners: Male    Birth control/ protection: None   Other Topics Concern  . 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  Skin:       Acne.   Objective:  BP 124/70   Pulse 70   Temp 97.7 F (36.5  C) (Oral)   Wt 141 lb 6 oz (64.1 kg)   SpO2 98%   BMI 25.86 kg/m   BP/Weight 02/19/2017 10/25/2016 08/01/2016  Systolic BP 124 116 120  Diastolic BP 70 72 64  Wt. (Lbs) 141.38 133 128.25  BMI 25.86 24.33 23.46  Some encounter information is confidential and restricted. Go to Review Flowsheets activity to see all data.   Physical Exam  Constitutional: She appears well-developed.  HENT:  Head: Normocephalic and atraumatic.  Cardiovascular: Normal rate and regular rhythm.   Pulmonary/Chest: Effort normal and breath sounds normal.  Skin:  Face - inflammatory acne noted.   Psychiatric:  Flat affect, depressed mood.   Vitals reviewed.   Lab Results  Component Value Date   WBC 11.3 (H) 07/04/2016   HGB 13.6 07/04/2016   HCT 39.1 07/04/2016   PLT 223 07/04/2016   GLUCOSE 108 (H) 07/04/2016   ALT 18 07/04/2016   AST 19 07/04/2016   NA 138 07/04/2016   K 3.8 07/04/2016   CL 108 07/04/2016   CREATININE 0.63 07/04/2016   BUN 9 07/04/2016   CO2 26 07/04/2016   TSH 1.71 02/03/2016    Assessment & Plan:   Problem  List Items Addressed This Visit    Tobacco use disorder    Advised to cut back and try and quit on her on. Did not want to proceed with requested chantix given psychiatric history. Patient to consider Wellbutrin.      Dysmenorrhea    Needs to see GYN. Note for work given.       Acne    New problem (to me). Likely needs accutane. Referring to derm.      Relevant Orders   Ambulatory referral to Dermatology      Follow-up: PRN  Everlene Other DO [facility]

## 2017-03-01 DIAGNOSIS — L700 Acne vulgaris: Secondary | ICD-10-CM | POA: Diagnosis not present

## 2017-04-08 DIAGNOSIS — L700 Acne vulgaris: Secondary | ICD-10-CM | POA: Diagnosis not present

## 2017-04-08 DIAGNOSIS — Z79899 Other long term (current) drug therapy: Secondary | ICD-10-CM | POA: Diagnosis not present

## 2017-05-06 DIAGNOSIS — L700 Acne vulgaris: Secondary | ICD-10-CM | POA: Diagnosis not present

## 2017-05-06 DIAGNOSIS — Z79899 Other long term (current) drug therapy: Secondary | ICD-10-CM | POA: Diagnosis not present

## 2017-06-03 ENCOUNTER — Encounter: Payer: Self-pay | Admitting: Emergency Medicine

## 2017-06-03 ENCOUNTER — Emergency Department
Admission: EM | Admit: 2017-06-03 | Discharge: 2017-06-03 | Disposition: A | Discharge status: Home / Self Care | Payer: BLUE CROSS/BLUE SHIELD | Attending: Emergency Medicine | Admitting: Emergency Medicine

## 2017-06-03 DIAGNOSIS — N3000 Acute cystitis without hematuria: Secondary | ICD-10-CM | POA: Insufficient documentation

## 2017-06-03 DIAGNOSIS — Z91040 Latex allergy status: Secondary | ICD-10-CM | POA: Diagnosis not present

## 2017-06-03 DIAGNOSIS — N390 Urinary tract infection, site not specified: Secondary | ICD-10-CM | POA: Diagnosis present

## 2017-06-03 DIAGNOSIS — F17290 Nicotine dependence, other tobacco product, uncomplicated: Secondary | ICD-10-CM | POA: Diagnosis not present

## 2017-06-03 MED ORDER — NITROFURANTOIN MONOHYD MACRO 100 MG PO CAPS: 100.0000 mg | ORAL_CAPSULE | Freq: Two times a day (BID) | ORAL | 0 refills | Status: AC

## 2017-06-03 NOTE — ED Provider Notes (Signed)
[facility] Emergency Department Provider Note  ____________________________________________   First MD Initiated Contact with Patient 06/03/17 0515     (approximate)  I have reviewed the triage vital signs and the nursing notes.   HISTORY  Chief Complaint Recurrent UTI    HPI Connie Fernandez is a 21 y.o. female who comes to the emergency department with 2 days of dysuria frequency and hesitancy that feels like previous UTIs she has had in the past. She's not had a urinary tract infection in over a year. She began taking AZO over-the-counter yesterday which seems to have helped her symptoms. She's had no fevers or chills. No back pain. She has had no recent antibiotics. She reports hives when she takes penicillin.   Past Medical History:  Diagnosis Date  . Anemia   . Anxiety   . Depression   . Dysmenorrhea in adolescent   . Endometriosis   . Headache    Migraines  . Heart palpitations   . Shortness of breath dyspnea    with exertion  . UTI (lower urinary tract infection)     Patient Active Problem List   Diagnosis Date Noted  . Acne 02/19/2017  . Dysmenorrhea 02/19/2017  . Chronic pelvic pain in female 07/16/2016  . Tobacco use disorder 10/28/2015  . Panic disorder 10/28/2015  . Major depressive disorder, recurrent episode (HCC) 10/28/2015    Past Surgical History:  Procedure Laterality Date  . LAPAROSCOPY N/A 06/12/2016   Procedure: LAPAROSCOPY DIAGNOSTIC;  Surgeon: Allie BossierMyra C Dove, MD;  Location: [facility];  Service: Gynecology;  Laterality: N/A;    Prior to Admission medications   Not on File    Allergies Latex and Penicillins  Family History  Problem Relation Age of Onset  . Diabetes Mother   . Alcohol abuse Father   . Hypertension Father   . COPD Maternal Grandmother   . Early death Maternal Grandmother [id number]  . Heart disease Maternal Grandmother   . Heart disease Maternal Grandfather 72  . Parkinsonism Paternal Grandmother      Social History Social History  Substance Use Topics  . Smoking status: Current Every Day Smoker    Packs/day: 0.00    Years: 1.00    Types: E-cigarettes  . Smokeless tobacco: Never Used  . Alcohol use No    Review of Systems Constitutional: No fever/chills ENT: No sore throat. Cardiovascular: Denies chest pain. Respiratory: Denies shortness of breath. Gastrointestinal: No abdominal pain.  No nausea, no vomiting.  No diarrhea.  No constipation. Musculoskeletal: Negative for back pain. Neurological: Negative for headaches   ____________________________________________   PHYSICAL EXAM:  VITAL SIGNS: ED Triage Vitals  Enc Vitals Group     BP 06/03/17 0502 135/75     Pulse Rate 06/03/17 0502 82     Resp 06/03/17 0502 18     Temp 06/03/17 0502 98 F (36.7 C)     Temp Source 06/03/17 0502 Oral     SpO2 06/03/17 0502 96 %     Weight 06/03/17 0459 140 lb (63.5 kg)     Height 06/03/17 0459 5\' 2"  (1.575 m)     Head Circumference --      Peak Flow --      Pain Score 06/03/17 0459 8     Pain Loc --      Pain Edu? --      Excl. in GC? --     Constitutional: Alert and oriented 4 well appearing nontoxic no diaphoresis speaks in  full clear sentences Head: Atraumatic. Nose: No congestion/rhinnorhea. Mouth/Throat: No trismus Neck: No stridor.   Cardiovascular: Regular rate and rhythm Respiratory: Normal respiratory effort.  No retractions. Neurologic:  Normal speech and language. No gross focal neurologic deficits are appreciated.  Skin:  Skin is warm, dry and intact. No rash noted.    ____________________________________________  LABS (all labs ordered are listed, but only abnormal results are displayed)  Labs Reviewed - No data to display   __________________________________________  EKG   ____________________________________________  RADIOLOGY   ____________________________________________   PROCEDURES  Procedure(s) performed:  no  Procedures  Critical Care performed: no  Observation: no ____________________________________________   INITIAL IMPRESSION / ASSESSMENT AND PLAN / ED COURSE  Pertinent labs & imaging results that were available during my care of the patient were reviewed by me and considered in my medical decision making (see chart for details).  The patient is very well-appearing and hemodynamically stable. Clinically she has a urinary tract infection and does not require testing this is a simple cystitis. I will treat her with a short course of Macrobid and refer her back to primary care.      ____________________________________________   FINAL CLINICAL IMPRESSION(S) / ED DIAGNOSES  Final diagnoses:  None      NEW MEDICATIONS STARTED DURING THIS VISIT:  New Prescriptions   No medications on file     Note:  This document was prepared using Dragon voice recognition software and may include unintentional dictation errors.      Merrily Brittleifenbark, Claryssa Sandner, MD 06/03/17 [id number]

## 2017-06-03 NOTE — ED Triage Notes (Signed)
Patient states that she thinks that she has a UTI. Patient states that she started having pain with urination yesterday.

## 2017-06-03 NOTE — ED Notes (Signed)
Pt seen and assessed by MD; given work note for excusing her tardiness; verbalized understanding of antibiotic course and followup care

## 2017-06-03 NOTE — Discharge Instructions (Signed)
Please take all of your antibiotics as prescribed and follow up with her primary care physician as needed. Return to the emergency department for any concerns.  It was a pleasure to take care of you today, and thank you for coming to our emergency department.  If you have any questions or concerns before leaving please ask the nurse to grab me and I'm more than happy to go through your aftercare instructions again.  If you were prescribed any opioid pain medication today such as Norco, Vicodin, Percocet, morphine, hydrocodone, or oxycodone please make sure you do not drive when you are taking this medication as it can alter your ability to drive safely.  If you have any concerns once you are home that you are not improving or are in fact getting worse before you can make it to your follow-up appointment, please do not hesitate to call 911 and come back for further evaluation.  Merrily BrittleNeil Dontreal Miera, MD

## 2017-06-10 DIAGNOSIS — Z79899 Other long term (current) drug therapy: Secondary | ICD-10-CM | POA: Diagnosis not present

## 2017-06-10 DIAGNOSIS — L308 Other specified dermatitis: Secondary | ICD-10-CM | POA: Diagnosis not present

## 2017-06-10 DIAGNOSIS — L700 Acne vulgaris: Secondary | ICD-10-CM | POA: Diagnosis not present

## 2017-07-11 DIAGNOSIS — Z79899 Other long term (current) drug therapy: Secondary | ICD-10-CM | POA: Diagnosis not present

## 2017-07-11 DIAGNOSIS — L700 Acne vulgaris: Secondary | ICD-10-CM | POA: Diagnosis not present

## 2017-07-19 ENCOUNTER — Encounter: Payer: Self-pay | Admitting: Emergency Medicine

## 2017-07-19 ENCOUNTER — Emergency Department
Admission: EM | Admit: 2017-07-19 | Discharge: 2017-07-19 | Disposition: A | Discharge status: Home / Self Care | Payer: BLUE CROSS/BLUE SHIELD | Attending: Emergency Medicine | Admitting: Emergency Medicine

## 2017-07-19 DIAGNOSIS — F17210 Nicotine dependence, cigarettes, uncomplicated: Secondary | ICD-10-CM | POA: Diagnosis not present

## 2017-07-19 DIAGNOSIS — Z91040 Latex allergy status: Secondary | ICD-10-CM | POA: Diagnosis not present

## 2017-07-19 DIAGNOSIS — N390 Urinary tract infection, site not specified: Secondary | ICD-10-CM | POA: Insufficient documentation

## 2017-07-19 DIAGNOSIS — R300 Dysuria: Secondary | ICD-10-CM | POA: Diagnosis not present

## 2017-07-19 LAB — URINALYSIS, ROUTINE W REFLEX MICROSCOPIC
Bacteria, UA: NONE SEEN
Specific Gravity, Urine: 1.026 (ref 1.005–1.030)

## 2017-07-19 LAB — POCT PREGNANCY, URINE: Preg Test, Ur: NEGATIVE

## 2017-07-19 MED ORDER — SULFAMETHOXAZOLE-TRIMETHOPRIM 800-160 MG PO TABS: 1.0000 | ORAL_TABLET | Freq: Two times a day (BID) | ORAL | 0 refills | Status: DC

## 2017-07-19 MED ORDER — IBUPROFEN 600 MG PO TABS
600.0000 mg | ORAL_TABLET | Freq: Once | ORAL | Status: AC
  Administered 2017-07-19: 600 mg via ORAL
  Filled 2017-07-19: qty 1

## 2017-07-19 NOTE — Discharge Instructions (Signed)
Take antibiotic as instructed, follow up with your doctor for recheck of urine after finishing antibiotic, if abdominal pain is worsening then return to the ER

## 2017-07-19 NOTE — ED Triage Notes (Signed)
Pt c/o dysuria and trouble urinating for past 3 days.  History of UTI one month ago.  No fevers.  No hematuria

## 2017-07-19 NOTE — ED Provider Notes (Signed)
[facility] Emergency Department Provider Note  ____________________________________________   First MD Initiated Contact with Patient 07/19/17 [phone]     (approximate)  I have reviewed the triage vital signs and the nursing notes.   HISTORY  Chief Complaint Dysuria    HPI Connie Fernandez is a 21 y.o. female complains of UTI symptoms, complains of frequency, burning, urgency, and inability to void. States she had a UTI 1 month ago and was treated with an antibiotic. Denies vaginal discharge. denies back pain, fever, chills, vomiting, diarrhea,or new sexual partner. States she does have a history of ovarian cyst and endometriosis. Is on Accutane for acne at this time   Past Medical History:  Diagnosis Date  . Anemia   . Anxiety   . Depression   . Dysmenorrhea in adolescent   . Endometriosis   . Headache    Migraines  . Heart palpitations   . Shortness of breath dyspnea    with exertion  . UTI (lower urinary tract infection)     Patient Active Problem List   Diagnosis Date Noted  . Acne 02/19/2017  . Dysmenorrhea 02/19/2017  . Chronic pelvic pain in female 07/16/2016  . Tobacco use disorder 10/28/2015  . Panic disorder 10/28/2015  . Major depressive disorder, recurrent episode (HCC) 10/28/2015    Past Surgical History:  Procedure Laterality Date  . LAPAROSCOPY N/A 06/12/2016   Procedure: LAPAROSCOPY DIAGNOSTIC;  Surgeon: Allie Bossier, MD;  Location: [facility];  Service: Gynecology;  Laterality: N/A;    Prior to Admission medications   Medication Sig Start Date End Date Taking? Authorizing Provider  sulfamethoxazole-trimethoprim (BACTRIM DS,SEPTRA DS) 800-160 MG tablet Take 1 tablet by mouth 2 (two) times daily. 07/19/17   Faythe Ghee, PA-C    Allergies Latex and Penicillins  Family History  Problem Relation Age of Onset  . Diabetes Mother   . Alcohol abuse Father   . Hypertension Father   . COPD Maternal Grandmother   . Early  death Maternal Grandmother 62  . Heart disease Maternal Grandmother   . Heart disease Maternal Grandfather 72  . Parkinsonism Paternal Grandmother     Social History Social History  Substance Use Topics  . Smoking status: Current Every Day Smoker    Packs/day: 0.00    Years: 1.00    Types: E-cigarettes  . Smokeless tobacco: Never Used  . Alcohol use No    Review of Systems  Constitutional: No fever/chills Eyes: No visual changes. ENT: No sore throat. Respiratory: Denies cough Genitourinary: Complains of burning with urination, urgency, and frequency Musculoskeletal: Negative for back pain. Skin: Negative for rash.    ____________________________________________   PHYSICAL EXAM:  VITAL SIGNS: ED Triage Vitals  Enc Vitals Group     BP 07/19/17 0801 112/71     Pulse Rate 07/19/17 0801 (!) 120     Resp 07/19/17 0801 20     Temp 07/19/17 0801 97.7 F (36.5 C)     Temp Source 07/19/17 0801 Oral     SpO2 07/19/17 0801 96 %     Weight 07/19/17 0757 140 lb (63.5 kg)     Height 07/19/17 0757  (1.575 m)     Head Circumference --      Peak Flow --      Pain Score 07/19/17 0757 10     Pain Loc --      Pain Edu? --      Excl. in GC? --  Constitutional: Alert and oriented. Well appearing and in no acute distress. Eyes: Conjunctivae are normal.  Head: Atraumatic. Nose: No congestion/rhinnorhea. Mouth/Throat: Mucous membranes are moist.   Cardiovascular: Normal rate, regular rhythm. Respiratory: Normal respiratory effort.  No retractions, c to a Gastrointestinal: Abdomen is soft nontender bowel sounds normal all 4 quadrants GU: deferred Musculoskeletal: FROM all extremities, warm and well perfused Neurologic:  Normal speech and language.  Skin:  Skin is warm, dry and intact. No rash noted. Psychiatric: Mood and affect are normal. Speech and behavior are normal.  ____________________________________________   LABS (all labs ordered are listed, but only  abnormal results are displayed)  Labs Reviewed  URINALYSIS, ROUTINE W REFLEX MICROSCOPIC - Abnormal; Notable for the following:       Result Value   Color, Urine ORANGE (*)    APPearance CLOUDY (*)    Glucose, UA   (*)    Value: TEST NOT REPORTED DUE TO COLOR INTERFERENCE OF URINE PIGMENT   Hgb urine dipstick   (*)    Value: TEST NOT REPORTED DUE TO COLOR INTERFERENCE OF URINE PIGMENT   Bilirubin Urine   (*)    Value: TEST NOT REPORTED DUE TO COLOR INTERFERENCE OF URINE PIGMENT   Ketones, ur   (*)    Value: TEST NOT REPORTED DUE TO COLOR INTERFERENCE OF URINE PIGMENT   Protein, ur   (*)    Value: TEST NOT REPORTED DUE TO COLOR INTERFERENCE OF URINE PIGMENT   Nitrite   (*)    Value: TEST NOT REPORTED DUE TO COLOR INTERFERENCE OF URINE PIGMENT   Leukocytes, UA   (*)    Value: TEST NOT REPORTED DUE TO COLOR INTERFERENCE OF URINE PIGMENT   Squamous Epithelial / LPF 6-30 (*)    All other components within normal limits  URINE CULTURE  POC URINE PREG, ED  POCT PREGNANCY, URINE   ____________________________________________   ____________________________________________  RADIOLOGY None  ____________________________________________   PROCEDURES  Procedure(s) performed: None ____________________________________________   INITIAL IMPRESSION / ASSESSMENT AND PLAN / ED COURSE  Pertinent labs & imaging results that were available during my care of the patient were reviewed by me and considered in my medical decision making (see chart for details). Patient has a UTI which is recurrent. Was treated with Macrobid on last visit. We'll start on Bactrim DS one twice a day for 7 days. Patient is to follow-up with her doctor in 1 week for recheck of urine. Urine culture ordered.      ____________________________________________   FINAL CLINICAL IMPRESSION(S) / ED DIAGNOSES  Final diagnoses:  Lower urinary tract infectious disease      NEW MEDICATIONS STARTED DURING THIS  VISIT:  New Prescriptions   SULFAMETHOXAZOLE-TRIMETHOPRIM (BACTRIM DS,SEPTRA DS) 800-160 MG TABLET    Take 1 tablet by mouth 2 (two) times daily.     Note:  This document was prepared using Dragon voice recognition software and may include unintentional dictation errors.    Faythe Ghee, PA-C 07/19/17 0915    Schaevitz, Myra Rude, MD 07/19/17 1400

## 2017-07-19 NOTE — ED Notes (Signed)
See triage note  states she developed some dysuria about 3 days ago  Recently dx'd with UTI about 1 month ago  Denies any fever.

## 2017-07-21 LAB — URINE CULTURE
Culture: >=100000 — AB
Special Requests: NORMAL

## 2017-07-23 ENCOUNTER — Telehealth: Payer: Self-pay | Admitting: Emergency Medicine

## 2017-07-23 NOTE — Telephone Encounter (Signed)
Pt called asking for result of urine culture.  Explained that it was uti with e coli and that it was sensitive to septra.  Also explained that a pharmacist would be reviewing .   She agrees to call her pcp as well and let them know she is on the antibiotic and need for follow up.

## 2017-07-25 ENCOUNTER — Ambulatory Visit (INDEPENDENT_AMBULATORY_CARE_PROVIDER_SITE_OTHER): Payer: BLUE CROSS/BLUE SHIELD | Admitting: Family Medicine

## 2017-07-25 VITALS — BP 108/64 | HR 81 | Temp 98.3°F | Resp 16 | Wt 141.1 lb

## 2017-07-25 DIAGNOSIS — Z87898 Personal history of other specified conditions: Secondary | ICD-10-CM | POA: Insufficient documentation

## 2017-07-25 DIAGNOSIS — N3001 Acute cystitis with hematuria: Secondary | ICD-10-CM

## 2017-07-25 DIAGNOSIS — F1991 Other psychoactive substance use, unspecified, in remission: Secondary | ICD-10-CM | POA: Insufficient documentation

## 2017-07-25 DIAGNOSIS — R300 Dysuria: Secondary | ICD-10-CM | POA: Diagnosis not present

## 2017-07-25 HISTORY — DX: Acute cystitis with hematuria: N30.01

## 2017-07-25 HISTORY — DX: Other psychoactive substance use, unspecified, in remission: F19.91

## 2017-07-25 LAB — POC URINALSYSI DIPSTICK (AUTOMATED)
Glucose, UA: NEGATIVE
Ketones, UA: 15
Leukocytes, UA: NEGATIVE
Nitrite, UA: NEGATIVE
Spec Grav, UA: >=1.03 — AB (ref 1.010–1.025)
Urobilinogen, UA: 1 E.U./dL
pH, UA: 6 (ref 5.0–8.0)

## 2017-07-25 MED ORDER — CEFTRIAXONE SODIUM 1 G IJ SOLR
1.0000 g | Freq: Once | INTRAMUSCULAR | Status: AC
  Administered 2017-07-25: 1 g via INTRAMUSCULAR

## 2017-07-25 NOTE — Patient Instructions (Signed)
Finish the antibiotic.  We will let you know the results.  Take care  Dr. Adriana Simas

## 2017-07-25 NOTE — Assessment & Plan Note (Signed)
New acute problem. Given persistent symptoms and culture results, giving IM Rocephin today. Patient to complete course of Bactrim.

## 2017-07-25 NOTE — Progress Notes (Signed)
Subjective:  Patient ID: Connie Fernandez, female    DOB: [birth year]  Age: 21 y.o. MRN: [id number]  CC: UTI  HPI:  21 year old female presents with the above complaint.  Patient recent seen in the ED on 9/14. Presented with frequency, dysuria, urgency. Found to have UTI. Treated with Bactrim.  Patient reports that she continues to have symptoms. She still having urinary frequency and dysuria. Moderate in severity. Worse in the morning and at night.  She endorses compliance with the Bactrim. No fevers. No other associated symptoms. Culture results revealed Escherichia coli which was pansensitive. MIC of Bactrim was 20. She states that she had some improvement with Bactrim but her symptoms are not completely resolved.  Additionally, patient would like to have screening for hepatitis C and HIV as she has used Heroin in the past and used a dirty needle. Requesting testing today.  Social Hx   Social History   Social History  . Marital status: Single    Spouse name: N/A  . Number of children: N/A  . Years of education: N/A   Social History Main Topics  . Smoking status: Current Every Day Smoker    Packs/day: 0.00    Years: 1.00    Types: E-cigarettes  . Smokeless tobacco: Never Used  . Alcohol use No  . Drug use: No  . Sexual activity: Not on file   Other Topics Concern  . Not on file   Social History Narrative   Lives with mother, father, and 2 sisters    [employer]video editor and works [employer]    1 dog    Right handed    Enjoys working, Chief of Staff out with friends             Review of Systems  Constitutional: Negative for fever.  Gastrointestinal: Negative.   Genitourinary: Positive for dysuria and frequency. Negative for flank pain.   Objective:  BP 108/64 (BP Location: Left Arm, Patient Position: Sitting, Cuff Size: Normal)   Pulse 81   Temp 98.3 F (36.8 C) (Oral)   Resp 16   Wt 141 lb 2 oz (64 kg)   LMP 07/09/2017 (Approximate)   SpO2 98%   BMI 25.81 kg/m    BP/Weight 07/25/2017 07/19/2017 06/03/2017  Systolic BP 108 112 135  Diastolic BP 64 71 75  Wt. (Lbs) 141.13 140 140  BMI 25.81 25.61 25.61  Some encounter information is confidential and restricted. Go to Review Flowsheets activity to see all data.   Physical Exam  Constitutional: She is oriented to person, place, and time. She appears well-developed. No distress.  Cardiovascular: Normal rate and regular rhythm.   2/6 systolic murmur.  Pulmonary/Chest: Effort normal. She has no wheezes. She has no rales.  Abdominal: Soft. She exhibits no distension. There is no tenderness. There is no rebound and no guarding.  Neurological: She is alert and oriented to person, place, and time.  Vitals reviewed.   Lab Results  Component Value Date   WBC 11.3 (H) 07/04/2016   HGB 13.6 07/04/2016   HCT 39.1 07/04/2016   PLT 223 07/04/2016   GLUCOSE 108 (H) 07/04/2016   ALT 18 07/04/2016   AST 19 07/04/2016   NA 138 07/04/2016   K 3.8 07/04/2016   CL 108 07/04/2016   CREATININE 0.63 07/04/2016   BUN 9 07/04/2016   CO2 26 07/04/2016   TSH 1.71 02/03/2016    Assessment & Plan:   Problem List Items Addressed This Visit    Acute cystitis with  hematuria - Primary    New acute problem. Given persistent symptoms and culture results, giving IM Rocephin today. Patient to complete course of Bactrim.      Relevant Medications   cefTRIAXone (ROCEPHIN) injection 1 g (Completed)   Other Relevant Orders   POCT Urinalysis Dipstick (Automated) (Completed)   Urine Culture   History of drug use    Hep C and HIV testing today.      Relevant Orders   Hepatitis C Antibody   HIV antibody (with reflex)      Meds ordered this encounter  Medications  . ABSORICA 30 MG capsule    Sig: Take 60 mg by mouth daily.    Refill:  0  . cefTRIAXone (ROCEPHIN) injection 1 g   Follow-up: PRN  Everlene Other DO [facility]

## 2017-07-25 NOTE — Assessment & Plan Note (Signed)
Hep C and HIV testing today.

## 2017-07-26 LAB — HEPATITIS C ANTIBODY
Hepatitis C Ab: NONREACTIVE
SIGNAL TO CUT-OFF: 0.01 (ref <1.00)

## 2017-07-26 LAB — URINE CULTURE
MICRO NUMBER:: 81041437
SPECIMEN QUALITY:: ADEQUATE

## 2017-07-26 LAB — HIV ANTIBODY (ROUTINE TESTING W REFLEX): HIV 1&2 Ab, 4th Generation: NONREACTIVE

## 2017-08-05 ENCOUNTER — Ambulatory Visit (INDEPENDENT_AMBULATORY_CARE_PROVIDER_SITE_OTHER): Payer: BLUE CROSS/BLUE SHIELD | Admitting: Family

## 2017-08-05 ENCOUNTER — Encounter: Payer: Self-pay | Admitting: Family

## 2017-08-05 VITALS — BP 98/68 | HR 85 | Temp 97.9°F | Ht 62.0 in | Wt 145.2 lb

## 2017-08-05 DIAGNOSIS — Z23 Encounter for immunization: Secondary | ICD-10-CM | POA: Diagnosis not present

## 2017-08-05 DIAGNOSIS — F331 Major depressive disorder, recurrent, moderate: Secondary | ICD-10-CM

## 2017-08-05 DIAGNOSIS — G8929 Other chronic pain: Secondary | ICD-10-CM

## 2017-08-05 DIAGNOSIS — R102 Pelvic and perineal pain: Secondary | ICD-10-CM | POA: Diagnosis not present

## 2017-08-05 MED ORDER — ESCITALOPRAM OXALATE 10 MG PO TABS: 10.0000 mg | ORAL_TABLET | Freq: Every day | ORAL | 1 refills | Status: DC

## 2017-08-05 NOTE — Addendum Note (Signed)
Addended by: Elise Benne T on: 08/05/2017 03:17 PM   Modules accepted: Orders

## 2017-08-05 NOTE — Assessment & Plan Note (Addendum)
NO pain today. Review prior workup with patient. Discussed with patient, I would consult GYN  again to regroup with Dr. Marice Potter to see if there are any other treatments out there to treat her chronic pelvic pain. Advised her that the narcotics would be inappropriate. Patient very understood this, especially in the context of her past medical history and heroin addiction. Advised patient I first  Treat depression/anxiety and see if this helps with some of her somatic pain symptoms. Will follow.

## 2017-08-05 NOTE — Progress Notes (Signed)
Pre visit review using our clinic review tool, if applicable. No additional management support is needed unless otherwise documented below in the visit note. 

## 2017-08-05 NOTE — Progress Notes (Signed)
Subjective:    Patient ID: Connie Fernandez, female    DOB: May 31, 1996, 21 y.o.   MRN: [id number]  CC: Connie Fernandez is a 21 y.o. female who presents today for an acute visit.    HPI: CC: menstrual cramping for years, unchanged. NO cramping now.  Periods come once to twice per month.  LMP : 07/12/17 NO concerns of STDs, pregnancy. No dysuria, urinary frequency.   Tried OCPs, implanon, depo with no relief;  percocet helps. . Had seen Dr Connie Fernandez.   Accompanied by mother, mother has endometriosis.   Complains of depression, anxiety for years, worsening.   Would like referral to psychiatrist. Has angry episodes and wants to go back to drugs.  Hospitalized [facility] last year for cutting. Prior medications zoloft which didn't work.  Past use of heroin use. No mania, hallucinations.    Had seen counseling in past, not recently. No psychiatrist.   No thoughts of hurting herself again or anyone else.   Thinks accutane is making mood worse. Off of accutane for one month and plans to restart 08/12/17 as acne also makes depression worse     Normal transvaginal US 06/2016 Also had laparoscopy   Had seen urology in [facility].    HISTORY:  Past Medical History:  Diagnosis Date  . Anemia   . Anxiety   . Depression   . Dysmenorrhea in adolescent   . Endometriosis   . Headache    Migraines  . Heart palpitations   . Shortness of breath dyspnea    with exertion  . UTI (lower urinary tract infection)    Past Surgical History:  Procedure Laterality Date  . LAPAROSCOPY N/A 06/12/2016   Procedure: LAPAROSCOPY DIAGNOSTIC;  Surgeon: Connie Bossier, MD;  Location: [facility];  Service: Gynecology;  Laterality: N/A;   Family History  Problem Relation Age of Onset  . Diabetes Mother   . Alcohol abuse Father   . Hypertension Father   . COPD Maternal Grandmother   . Early death Maternal Grandmother 55  . Heart disease Maternal Grandmother   . Heart disease Maternal Grandfather 72  .  Parkinsonism Paternal Grandmother     Allergies: Latex and Penicillins Current Outpatient Prescriptions on File Prior to Visit  Medication Sig Dispense Refill  . ABSORICA 30 MG capsule Take 60 mg by mouth daily.  0  . sulfamethoxazole-trimethoprim (BACTRIM DS,SEPTRA DS) 800-160 MG tablet Take 1 tablet by mouth 2 (two) times daily. 14 tablet 0   No current facility-administered medications on file prior to visit.     Social History  Substance Use Topics  . Smoking status: Current Every Day Smoker    Packs/day: 0.00    Years: 1.00    Types: E-cigarettes  . Smokeless tobacco: Never Used  . Alcohol use No    Review of Systems  Constitutional: Negative for chills and fever.  Respiratory: Negative for cough.   Cardiovascular: Negative for chest pain and palpitations.  Gastrointestinal: Negative for abdominal distention, abdominal pain, nausea and vomiting.  Genitourinary: Positive for menstrual problem (chronic). Negative for decreased urine volume, dysuria, flank pain, vaginal bleeding, vaginal discharge and vaginal pain.  Psychiatric/Behavioral: Negative for suicidal ideas.      Objective:    BP 98/68   Pulse 85   Temp 97.9 F (36.6 C) (Oral)   Ht  (1.575 m)   Wt 145 lb 3.2 oz (65.9 kg)   LMP 07/09/2017 (Approximate)   SpO2 98%   BMI 26.56 kg/m  Physical Exam  Constitutional: She appears well-developed and well-nourished.  Eyes: Conjunctivae are normal.  Cardiovascular: Normal rate, regular rhythm, normal heart sounds and normal pulses.   Pulmonary/Chest: Effort normal and breath sounds normal. She has no wheezes. She has no rhonchi. She has no rales.  Abdominal: Soft. Normal appearance and bowel sounds are normal. She exhibits no distension, no fluid wave, no ascites and no mass. There is no tenderness. There is no rigidity, no rebound, no guarding and no CVA tenderness.  Neurological: She is alert.  Skin: Skin is warm and dry.  Psychiatric: She has a normal  mood and affect. Her speech is normal and behavior is normal. Thought content normal.  Vitals reviewed.      Assessment & Plan:   Problem List Items Addressed This Visit      Other   Major depressive disorder, recurrent episode (HCC) - Primary    Worsening. Advised patient she stop Accutane if she felt this was influencing her mood. She denies any suicide ideation today. We discussed starting an SSRI. Trial of Lexapro. Follow-up in 6 weeks. Also advised patient to establish psychiatry, counseling, she agreed to go to walk-in [facility] clinic tomorrow.Will follow.      Relevant Medications   escitalopram (LEXAPRO) 10 MG tablet   Chronic pelvic pain in female    NO pain today. Review prior workup with patient. Discussed with patient, I would consult GYN  again to regroup with Dr. Marice Fernandez to see if there are any other treatments out there to treat her chronic pelvic pain. Advised her that the narcotics would be inappropriate. Patient very understood this, especially in the context of her past medical history and heroin addiction. Advised patient I first  Treat depression/anxiety and see if this helps with some of her somatic pain symptoms. Will follow.      Relevant Medications   escitalopram (LEXAPRO) 10 MG tablet   Other Relevant Orders   Ambulatory referral to Obstetrics / Gynecology        I am having Ms. Uhls start on escitalopram. I am also having her maintain her sulfamethoxazole-trimethoprim and ABSORICA.   Meds ordered this encounter  Medications  . escitalopram (LEXAPRO) 10 MG tablet    Sig: Take 1 tablet (10 mg total) by mouth daily.    Dispense:  30 tablet    Refill:  1    Order Specific Question:   Supervising Provider    Answer:   Connie Fernandez [[id number]]    Return precautions given.   Risks, benefits, and alternatives of the medications and treatment plan prescribed today were discussed, and patient expressed understanding.   Education regarding symptom management  and diagnosis given to patient on AVS.  Continue to follow with Connie Sams, DO for routine health maintenance.   Connie Fernandez and I agreed with plan.   Connie Plowman, FNP

## 2017-08-05 NOTE — Patient Instructions (Addendum)
I would advise NOT to take accutane if affecting your mood.   Trial of lexapro. Follow up in 6 weeks  Referral back to Dr Marice Potter  I would like for you to go to [facility] services anytime between 8am-3pm.  They have counseling and psychiatrists there who can see you right away.  They have walk ins on Monday, Wednesdays, and Fridays.  The address is [address] [place], [place] [address].  Phone number is [phone].   They are near [facility] and [facility].   Please know that I am thinking about you.

## 2017-08-05 NOTE — Assessment & Plan Note (Signed)
Worsening. Advised patient she stop Accutane if she felt this was influencing her mood. She denies any suicide ideation today. We discussed starting an SSRI. Trial of Lexapro. Follow-up in 6 weeks. Also advised patient to establish psychiatry, counseling, she agreed to go to walk-in [facility] clinic tomorrow.Will follow.

## 2017-08-12 ENCOUNTER — Telehealth: Payer: Self-pay | Admitting: Radiology

## 2017-08-12 DIAGNOSIS — Z79899 Other long term (current) drug therapy: Secondary | ICD-10-CM | POA: Diagnosis not present

## 2017-08-12 DIAGNOSIS — L700 Acne vulgaris: Secondary | ICD-10-CM | POA: Diagnosis not present

## 2017-09-13 NOTE — Telephone Encounter (Signed)
error 

## 2017-09-20 ENCOUNTER — Encounter: Payer: Self-pay | Admitting: Emergency Medicine

## 2017-09-20 ENCOUNTER — Encounter: Payer: Self-pay | Admitting: Family Medicine

## 2017-09-20 ENCOUNTER — Ambulatory Visit (INDEPENDENT_AMBULATORY_CARE_PROVIDER_SITE_OTHER): Payer: BLUE CROSS/BLUE SHIELD | Admitting: Family Medicine

## 2017-09-20 VITALS — BP 110/70 | HR 80 | Temp 97.8°F | Wt 146.3 lb

## 2017-09-20 DIAGNOSIS — J00 Acute nasopharyngitis [common cold]: Secondary | ICD-10-CM

## 2017-09-20 NOTE — Progress Notes (Signed)
Subjective:    Patient ID: Connie PortelaJessica Fernandez, female    DOB: 12/09/1995, 21 y.o.   MRN: [id number]  No chief complaint on file. Patient accompanied by her mother who was sent to wait in the waiting room.  HPI Patient was seen today for acute concerns.  Patient endorses 3 days of sore throat, headache, earache, numbness of legs, cough.  Patient has not taken anything for symptoms. She expresses concerns about medication interactions with Absorica 30 mg (Accutane) for acne. Patient unsure of fever.  Patient endorses her girlfriend's sister as a sick contact.  Patient also endorses nausea.  Patient denies possibility of pregnancy as she has a girlfriend.  Patient is not currently on birth control.     Pt not currently taking Lexapro, though on med list. Past Medical History:  Diagnosis Date  . Anemia   . Anxiety   . Depression   . Dysmenorrhea in adolescent   . Endometriosis   . Headache    Migraines  . Heart palpitations   . Shortness of breath dyspnea    with exertion  . UTI (lower urinary tract infection)     Allergies  Allergen Reactions  . Latex Itching  . Penicillins Hives    Has patient had a PCN reaction causing immediate rash, facial/tongue/throat swelling, SOB or lightheadedness with hypotension: Dizziness and shaky. Has patient had a PCN reaction causing severe rash involving mucus membranes or skin necrosis: NO Has patient had a PCN reaction that required hospitalization: NO Has patient had a PCN reaction occurring within the last 10 years: NO If all of the above answers are "NO", then may proceed with Cephalosporin use.     ROS General: Denies fever, chills, night sweats, changes in weight, changes in appetite HEENT: Denies headaches, ear pain, changes in vision, rhinorrhea, sore throat   +sore throat, HA, R ear ache CV: Denies CP, palpitations, SOB, orthopnea Pulm: Denies SOB, cough, wheezing GI: Denies abdominal pain, vomiting, diarrhea, constipation   +nausea GU: Denies dysuria, hematuria, frequency, vaginal discharge Msk: Denies muscle cramps, joint pains Neuro: Denies weakness, numbness, tingling  +numbness in legs Skin: Denies rashes, bruising Psych: Denies depression, anxiety, hallucinations     Objective:    Blood pressure 110/70.   Gen. Pleasant, well-nourished, in no distress, normal affect   HEENT: [place]/AT, face symmetric, no scleral icterus, PERRLA, nares patent without drainage, pharynx with mild erythema, no exudate.  B/l TMs normal, no cerumen, no effusion or erythema Lungs: Dry cough, no accessory muscle use, CTAB, no wheezes or rales Cardiovascular: RRR, no m/r/g, no peripheral edema Abdomen: BS present, soft, NT/ND, no hepatosplenomegaly. Neuro: A&Ox 3, CNII-XII grossly intact, normal gait.   Wt Readings from Last 3 Encounters:  08/05/17 145 lb 3.2 oz (65.9 kg)  07/25/17 141 lb 2 oz (64 kg)  07/19/17 140 lb (63.5 kg)    Lab Results  Component Value Date   WBC 11.3 (H) 07/04/2016   HGB 13.6 07/04/2016   HCT 39.1 07/04/2016   PLT 223 07/04/2016   GLUCOSE 108 (H) 07/04/2016   ALT 18 07/04/2016   AST 19 07/04/2016   NA 138 07/04/2016   K 3.8 07/04/2016   CL 108 07/04/2016   CREATININE 0.63 07/04/2016   BUN 9 07/04/2016   CO2 26 07/04/2016   TSH 1.71 02/03/2016    Assessment/Plan:  Acute nasopharyngitis  -supportive care -Given handout  -Advised ok to take OTC cold medicine with Absorica. -F/u with pcp next week if symptoms become worse.

## 2017-09-20 NOTE — Patient Instructions (Addendum)
It is ok to take over the counter cough and cold medicine such as Delsym, DayQuil or NyQuil, Sudafed, etc.  If you have any questions you can ask your pharmacist.  For your sore throat you can gargle with warm salt water or Chloraseptic Spray.  If you develop a fever or your symptoms become worse don't hesitate to call your physician's office.  Viral Respiratory Infection A respiratory infection is an illness that affects part of the respiratory system, such as the lungs, nose, or throat. Most respiratory infections are caused by either viruses or bacteria. A respiratory infection that is caused by a virus is called a viral respiratory infection. Common types of viral respiratory infections include:  A cold.  The flu (influenza).  A respiratory syncytial virus (RSV) infection.  How do I know if I have a viral respiratory infection? Most viral respiratory infections cause:  A stuffy or runny nose.  Yellow or green nasal discharge.  A cough.  Sneezing.  Fatigue.  Achy muscles.  A sore throat.  Sweating or chills.  A fever.  A headache.  How are viral respiratory infections treated? If influenza is diagnosed early, it may be treated with an antiviral medicine that shortens the length of time a person has symptoms. Symptoms of viral respiratory infections may be treated with over-the-counter and prescription medicines, such as:  Expectorants. These make it easier to cough up mucus.  Decongestant nasal sprays.  Health care providers do not prescribe antibiotic medicines for viral infections. This is because antibiotics are designed to kill bacteria. They have no effect on viruses. How do I know if I should stay home from work or school? To avoid exposing others to your respiratory infection, stay home if you have:  A fever.  A persistent cough.  A sore throat.  A runny nose.  Sneezing.  Muscles  aches.  Headaches.  Fatigue.  Weakness.  Chills.  Sweating.  Nausea.  Follow these instructions at home:  Rest as much as possible.  Take over-the-counter and prescription medicines only as told by your health care provider.  Drink enough fluid to keep your urine clear or pale yellow. This helps prevent dehydration and helps loosen up mucus.  Gargle with a salt-water mixture 3-4 times per day or as needed. To make a salt-water mixture, completely dissolve -1 tsp of salt in 1 cup of warm water.  Use nose drops made from salt water to ease congestion and soften raw skin around your nose.  Do not drink alcohol.  Do not use tobacco products, including cigarettes, chewing tobacco, and e-cigarettes. If you need help quitting, ask your health care provider. Contact a health care provider if:  Your symptoms last for 10 days or longer.  Your symptoms get worse over time.  You have a fever.  You have severe sinus pain in your face or forehead.  The glands in your jaw or neck become very swollen. Get help right away if:  You feel pain or pressure in your chest.  You have shortness of breath.  You faint or feel like you will faint.  You have severe and persistent vomiting.  You feel confused or disoriented. This information is not intended to replace advice given to you by your health care provider. Make sure you discuss any questions you have with your health care provider. Document Released: 08/01/2005 Document Revised: 03/29/2016 Document Reviewed: 03/30/2015 Elsevier Interactive Patient Education  2017 [employer]

## 2017-09-20 NOTE — Progress Notes (Signed)
B/P 110/70  P 80 T 97.8  WT 146.8LB O2 98

## 2017-09-25 IMAGING — CR DG LUMBAR SPINE COMPLETE 4+V
5 series · 5 of 5 positions shown · non-contrast
Comparison: None.

CLINICAL DATA: Back pain with radicular symptoms x 1 month

EXAM:
LUMBAR SPINE - COMPLETE 4+ VIEW

[view not recorded (1 of 5)]
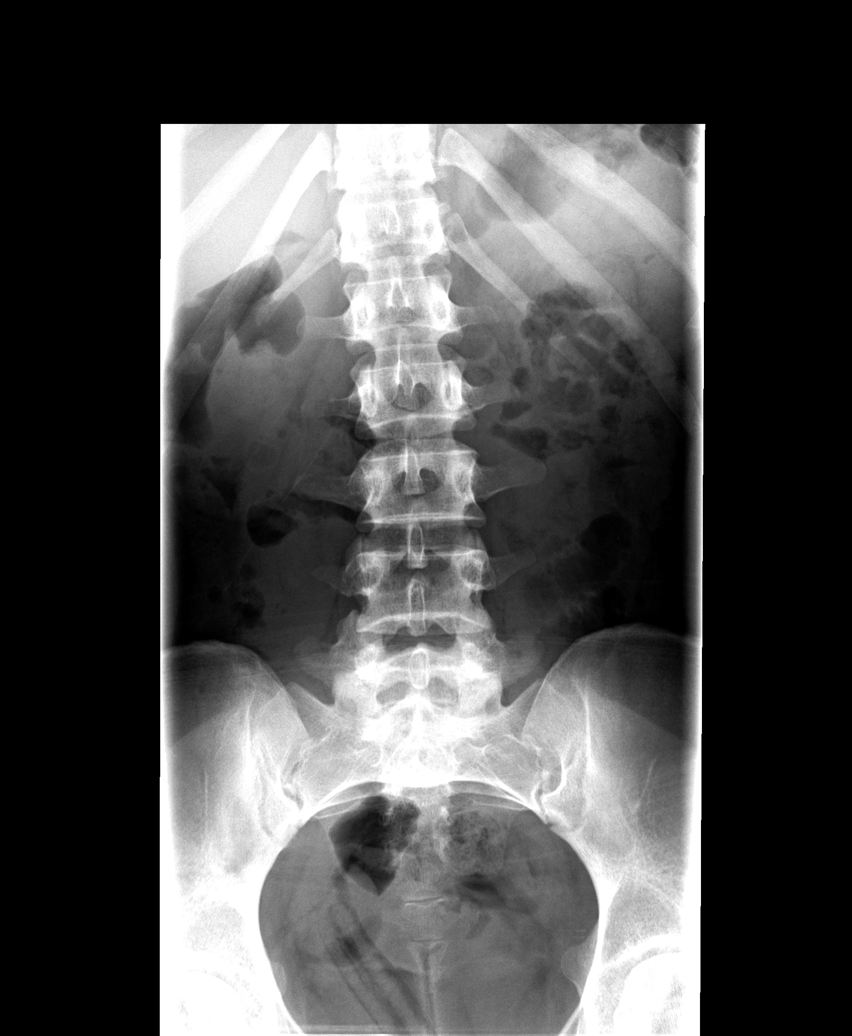

[view not recorded (2 of 5)]
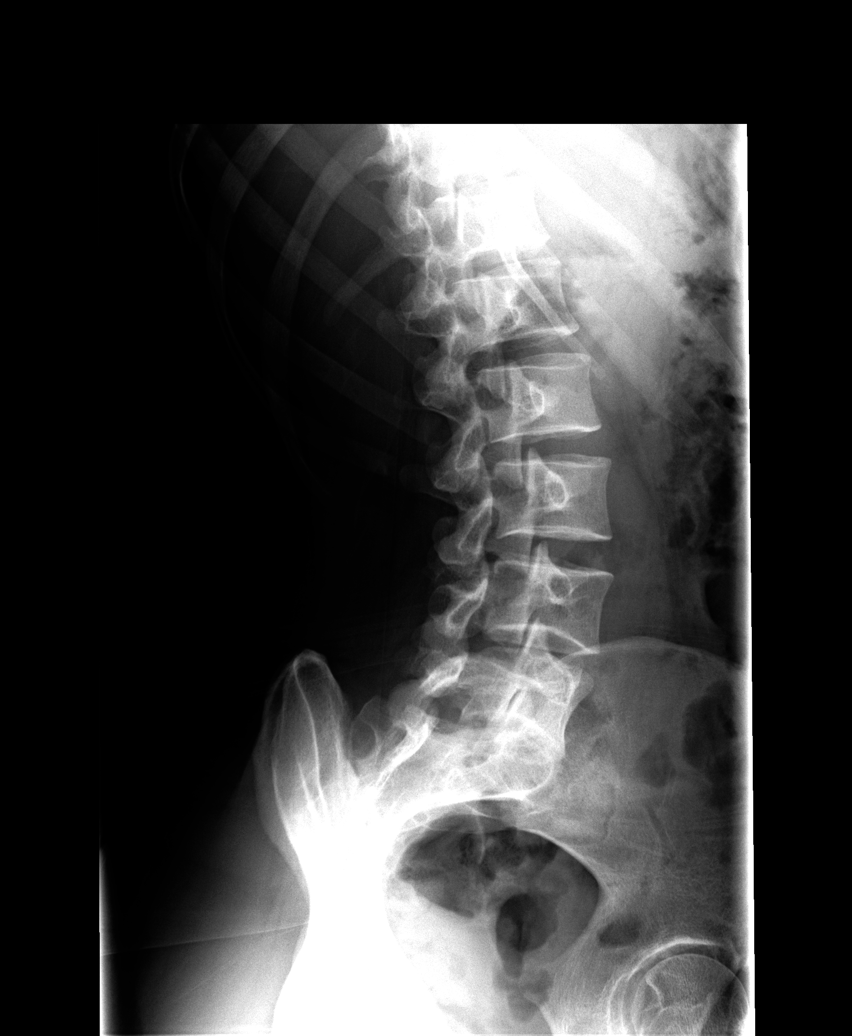

[view not recorded (3 of 5)]
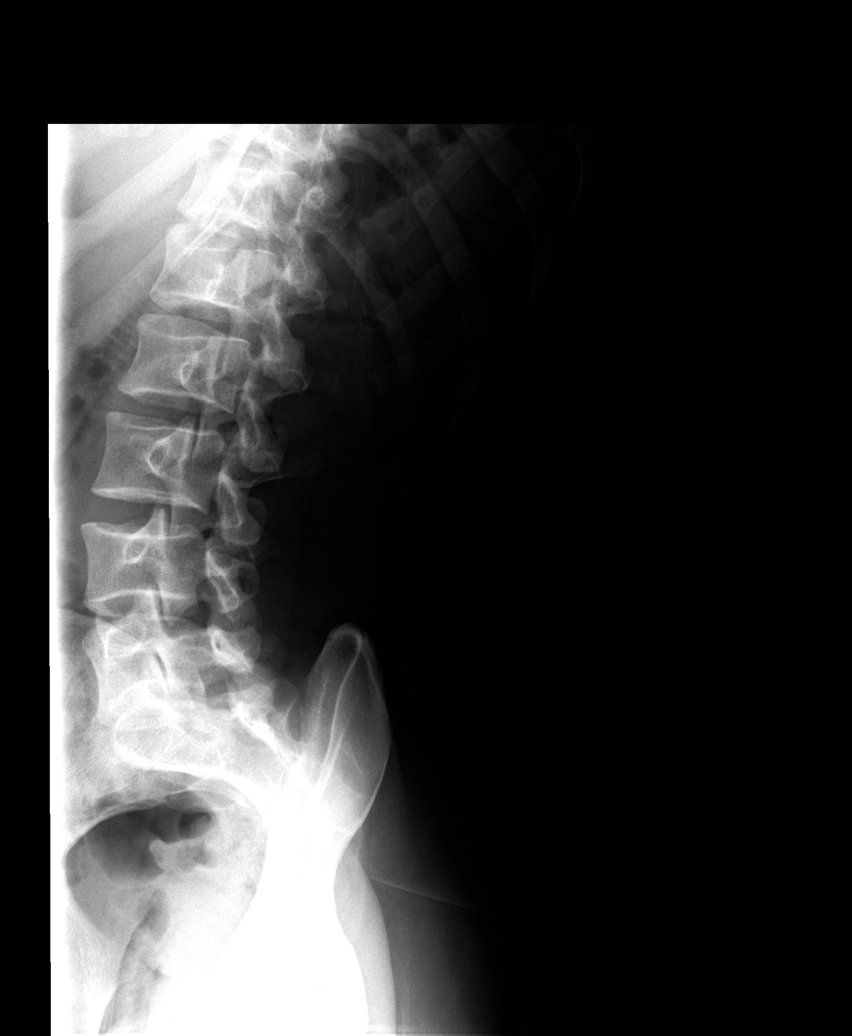

[view not recorded (4 of 5)]
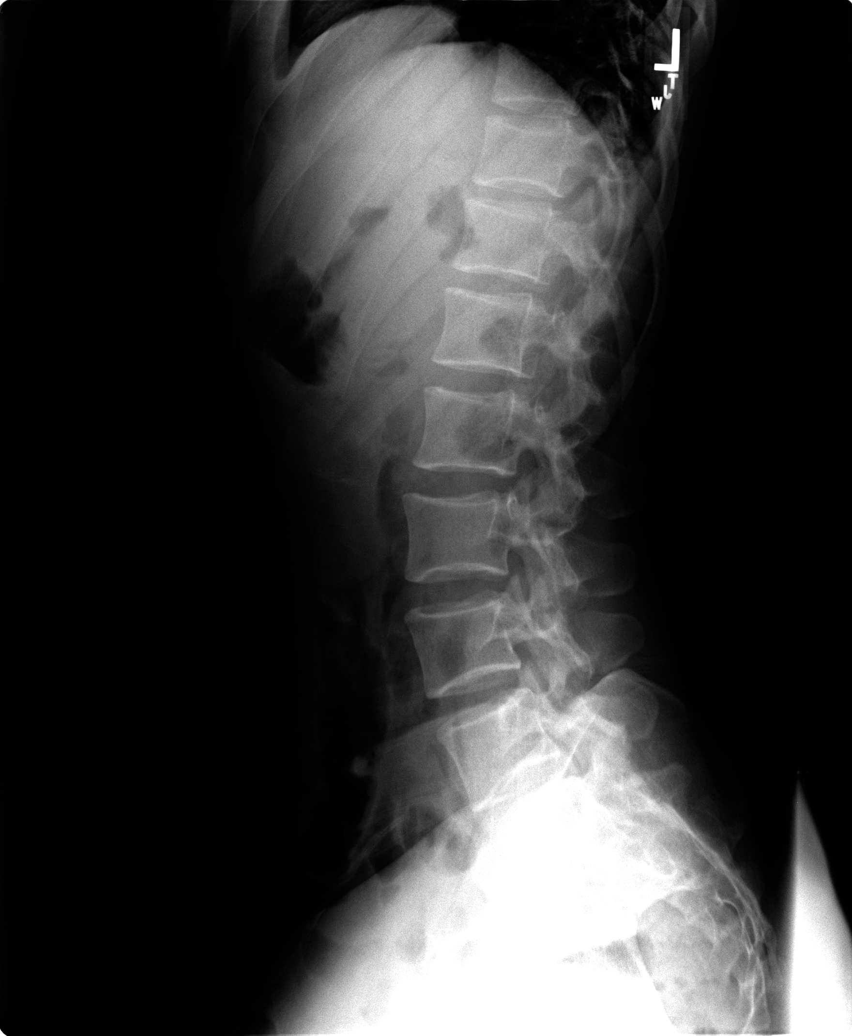

[view not recorded (5 of 5)]
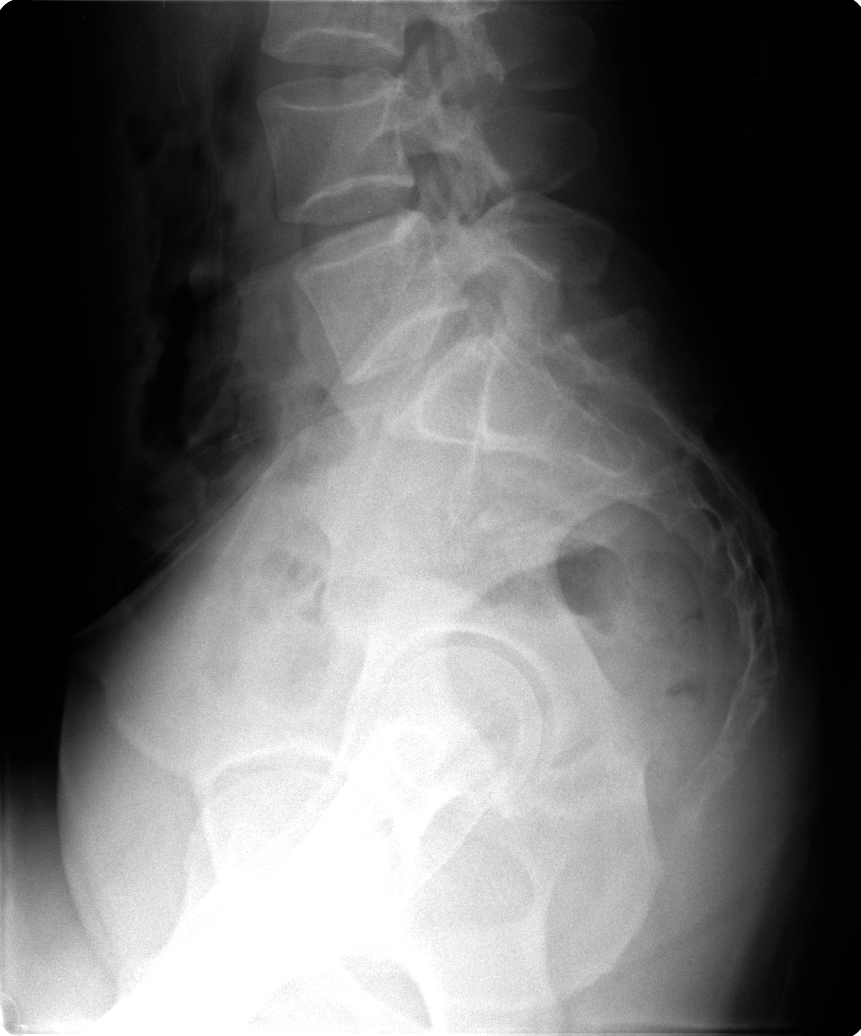

[5 of 5 positions shown; findings below may reference images not displayed]

FINDINGS: Normal alignment of lumbar vertebral bodies. No loss of vertebral
body height or disc height. No pars fracture. No subluxation.
IMPRESSION: Normal lumbar spine radiographs.

## 2017-10-07 ENCOUNTER — Other Ambulatory Visit: Payer: Self-pay | Admitting: Family

## 2017-10-07 DIAGNOSIS — F331 Major depressive disorder, recurrent, moderate: Secondary | ICD-10-CM

## 2017-10-14 ENCOUNTER — Ambulatory Visit: Payer: Self-pay | Admitting: Family Medicine

## 2017-10-22 ENCOUNTER — Ambulatory Visit (INDEPENDENT_AMBULATORY_CARE_PROVIDER_SITE_OTHER): Payer: BLUE CROSS/BLUE SHIELD | Admitting: Internal Medicine

## 2017-10-22 ENCOUNTER — Encounter (INDEPENDENT_AMBULATORY_CARE_PROVIDER_SITE_OTHER): Payer: Self-pay

## 2017-10-22 ENCOUNTER — Encounter: Payer: Self-pay | Admitting: Internal Medicine

## 2017-10-22 VITALS — BP 112/76 | HR 85 | Temp 98.1°F | Wt 145.0 lb

## 2017-10-22 DIAGNOSIS — J069 Acute upper respiratory infection, unspecified: Secondary | ICD-10-CM

## 2017-10-22 DIAGNOSIS — B9789 Other viral agents as the cause of diseases classified elsewhere: Secondary | ICD-10-CM | POA: Diagnosis not present

## 2017-10-22 DIAGNOSIS — J029 Acute pharyngitis, unspecified: Secondary | ICD-10-CM

## 2017-10-22 LAB — POCT RAPID STREP A (OFFICE): Rapid Strep A Screen: NEGATIVE

## 2017-10-22 MED ORDER — AZITHROMYCIN 250 MG PO TABS: ORAL_TABLET | ORAL | 0 refills | Status: DC

## 2017-10-22 NOTE — Patient Instructions (Signed)

## 2017-10-22 NOTE — Addendum Note (Signed)
Addended by: Roena MaladyEVONTENNO, MELANIE Y on: 10/22/2017 04:45 PM   Modules accepted: Orders

## 2017-10-22 NOTE — Progress Notes (Signed)
HPI  Pt presents to the clinic today with c/o runny nose, ear pain, sore throat and cough. This started 5 days ago. She is blowing yellow mucous out of her nose. She describes the ear pain as pressure. She is having difficulty swallowing. The cough is productive of yellow mucous. She denies fever, chills or body aches. She has not tried anything OTC. She has not had sick contacts.  Review of Systems      Past Medical History:  Diagnosis Date  . Anemia   . Anxiety   . Depression   . Dysmenorrhea in adolescent   . Endometriosis   . Headache    Migraines  . Heart palpitations   . Shortness of breath dyspnea    with exertion  . UTI (lower urinary tract infection)     Family History  Problem Relation Age of Onset  . Diabetes Mother   . Alcohol abuse Father   . Hypertension Father   . COPD Maternal Grandmother   . Early death Maternal Grandmother [phone]  . Heart disease Maternal Grandmother   . Heart disease Maternal Grandfather 72  . Parkinsonism Paternal Grandmother     Social History   Socioeconomic History  . Marital status: Single    Spouse name: Not on file  . Number of children: Not on file  . Years of education: Not on file  . Highest education level: Not on file  Social Needs  . Financial resource strain: Not on file  . Food insecurity - worry: Not on file  . Food insecurity - inability: Not on file  . Transportation needs - medical: Not on file  . Transportation needs - non-medical: Not on file  Occupational History  . Not on file  Tobacco Use  . Smoking status: Current Every Day Smoker    Packs/day: 0.00    Years: 1.00    Pack years: 0.00    Types: E-cigarettes  . Smokeless tobacco: Never Used  Substance and Sexual Activity  . Alcohol use: No    Alcohol/week: 0.0 oz  . Drug use: No  . Sexual activity: Not on file  Other Topics Concern  . Not on file  Social History Narrative   Lives with mother, father, and 2 sisters    [place] and works  [employer]    1 dog    Right handed    Enjoys working, Chief of Staffhanging out with friends          Allergies  Allergen Reactions  . Latex Itching  . Penicillins Hives    Has patient had a PCN reaction causing immediate rash, facial/tongue/throat swelling, SOB or lightheadedness with hypotension: Dizziness and shaky. Has patient had a PCN reaction causing severe rash involving mucus membranes or skin necrosis: NO Has patient had a PCN reaction that required hospitalization: NO Has patient had a PCN reaction occurring within the last 10 years: NO If all of the above answers are "NO", then may proceed with Cephalosporin use.      Constitutional: Denies headache, fatigue, fever or abrupt weight changes.  HEENT:  Positive runny nose, ear pain, sore throat. Denies eye redness, eye pain, pressure behind the eyes, facial pain, nasal congestion, ringing in the ears, wax buildup, or bloody nose. Respiratory: Positive cough. Denies difficulty breathing or shortness of breath.  Cardiovascular: Denies chest pain, chest tightness, palpitations or swelling in the hands or feet.   No other specific complaints in a complete review of systems (except as listed in HPI above).  Objective:   BP 112/76   Pulse 85   Temp 98.1 F (36.7 C) (Oral)   Wt 145 lb (65.8 kg)   LMP 10/18/2017   SpO2 99%   BMI 26.52 kg/m   Wt Readings from Last 3 Encounters:  10/22/17 145 lb (65.8 kg)  09/20/17 146 lb 4.8 oz (66.4 kg)  08/05/17 145 lb 3.2 oz (65.9 kg)     General: Appears her stated age, in NAD. HEENT: Head: normal shape and size, no sinus tenderness noted; Eyes: sclera white, no icterus, conjunctiva pink; Ears: Tm's gray and intact, normal light reflex, + serous effusion; Nose: mucosa pink and moist, septum midline; Throat/Mouth: + PND. Teeth present, mucosa erythematous and moist, patchy exudate noted on right tonsillar pillar.  Neck: No cervical lymphadenopathy.  Cardiovascular: Normal rate and rhythm. S1,S2  noted.  No murmur, rubs or gallops noted.  Pulmonary/Chest: Normal effort and positive vesicular breath sounds. No respiratory distress. No wheezes, rales or ronchi noted.       Assessment & Plan:   Viral Upper Respiratory Infection with Cough:  Rapid Strep: negative Get some rest and drink plenty of water Do salt water gargles for the sore throat Do salt water gargles OTC Flonase as needed for funny nose Printed Rx for Azithromax x 5 days to take if worse over the weekend. Delsym as needed for cough  RTC as needed or if symptoms persist.   Nicki ReaperBAITY, Anniah Glick, NP

## 2018-04-04 DIAGNOSIS — R102 Pelvic and perineal pain: Secondary | ICD-10-CM | POA: Diagnosis not present

## 2018-05-21 NOTE — Progress Notes (Signed)
Subjective:    Patient ID: Connie Fernandez, female    DOB: 1996-07-02, 22 y.o.   MRN: [id number]  HPI  Connie Fernandez is a 22 year old female who presents today with stomach burning and belching associated with meals.   Location: epigastric pain with food triggers Onset/Timing: 4 to 5 days Duration: one to 1 1/2 hours at the longest Severity/Quality: can range from 5 to 7 Worse/Better by: Food choices  Loss of appetite: No Vomiting: No Diarrhea: No Rectal bleeding: No Fever: No Weight loss: No  She denies chest pain, palpitations, arm pain, jaw pain, diaphoresis, N/V, SOB, black or tarry stools.  History of panic and major depressive disorder, tobacco abuse, and history of drug use  Diet consists of fried foods, spicy foods, and she eats out frequently. She is current daily smoker and reports drinking "very little" water and drinks mostly carbonated sodas throughout the day.    Review of Systems  Constitutional: Negative for chills, fatigue and fever.  Respiratory: Negative for cough, shortness of breath and wheezing.   Cardiovascular: Negative for chest pain and palpitations.  Gastrointestinal: Negative for blood in stool, constipation, diarrhea, nausea and vomiting.       Stomach burning and belching  Genitourinary: Negative for dysuria.  Skin: Negative for rash.  Neurological: Negative for dizziness, weakness and headaches.  Psychiatric/Behavioral:       Denies depressed or anxious mood today   Past Medical History:  Diagnosis Date  . Anemia   . Anxiety   . Depression   . Dysmenorrhea in adolescent   . Endometriosis   . Headache    Migraines  . Heart palpitations   . Shortness of breath dyspnea    with exertion  . UTI (lower urinary tract infection)      Social History   Socioeconomic History  . Marital status: Single    Spouse name: Not on file  . Number of children: Not on file  . Years of education: Not on file  . Highest education level: Not  on file  Occupational History  . Not on file  Social Needs  . Financial resource strain: Not on file  . Food insecurity:    Worry: Not on file    Inability: Not on file  . Transportation needs:    Medical: Not on file    Non-medical: Not on file  Tobacco Use  . Smoking status: Current Every Day Smoker    Packs/day: 0.00    Years: 1.00    Pack years: 0.00    Types: E-cigarettes  . Smokeless tobacco: Never Used  Substance and Sexual Activity  . Alcohol use: No    Alcohol/week: 0.0 oz  . Drug use: No  . Sexual activity: Not on file  Lifestyle  . Physical activity:    Days per week: Not on file    Minutes per session: Not on file  . Stress: Not on file  Relationships  . Social connections:    Talks on phone: Not on file    Gets together: Not on file    Attends religious service: Not on file    Active member of club or organization: Not on file    Attends meetings of clubs or organizations: Not on file    Relationship status: Not on file  . Intimate partner violence:    Fear of current or ex partner: Not on file    Emotionally abused: Not on file    Physically abused: Not on  file    Forced sexual activity: Not on file  Other Topics Concern  . Not on file  Social History Narrative   Lives with mother, father, and 2 sisters    [employer]/video editor and works [employer]    1 dog    Right handed    Enjoys working, Chief of Staff out with friends          Past Surgical History:  Procedure Laterality Date  . LAPAROSCOPY N/A 06/12/2016   Procedure: LAPAROSCOPY DIAGNOSTIC;  Surgeon: Allie Bossier, MD;  Location: [facility];  Service: Gynecology;  Laterality: N/A;    Family History  Problem Relation Age of Onset  . Diabetes Mother   . Alcohol abuse Father   . Hypertension Father   . COPD Maternal Grandmother   . Early death Maternal Grandmother 55  . Heart disease Maternal Grandmother   . Heart disease Maternal Grandfather 72  . Parkinsonism Paternal Grandmother     Allergies    Allergen Reactions  . Latex Itching  . Penicillins Hives    Has patient had a PCN reaction causing immediate rash, facial/tongue/throat swelling, SOB or lightheadedness with hypotension: Dizziness and shaky. Has patient had a PCN reaction causing severe rash involving mucus membranes or skin necrosis: NO Has patient had a PCN reaction that required hospitalization: NO Has patient had a PCN reaction occurring within the last 10 years: NO If all of the above answers are "NO", then may proceed with Cephalosporin use.     No current outpatient medications on file prior to visit.   No current facility-administered medications on file prior to visit.     BP 118/74 (BP Location: Left Arm, Patient Position: Sitting, Cuff Size: Normal)   Pulse 77   Temp 98 F (36.7 C) (Oral)   Resp 16   Wt 152 lb 6.4 oz (69.1 kg)   SpO2 99%   BMI 27.87 kg/m       Objective:   Physical Exam  Constitutional: She is oriented to person, place, and time. She appears well-developed and well-nourished.  HENT:  Right Ear: Tympanic membrane normal.  Left Ear: Tympanic membrane normal.  Nose: Right sinus exhibits no maxillary sinus tenderness and no frontal sinus tenderness. Left sinus exhibits no maxillary sinus tenderness and no frontal sinus tenderness.  Mouth/Throat: Oropharynx is clear and moist and mucous membranes are normal.  Eyes: Pupils are equal, round, and reactive to light. No scleral icterus.  Neck: Neck supple.  Cardiovascular: Normal rate, regular rhythm and intact distal pulses.  Pulmonary/Chest: Effort normal and breath sounds normal. She has no wheezes. She has no rales.  Abdominal: Soft. Bowel sounds are normal. She exhibits no distension. There is no tenderness. There is no guarding, no CVA tenderness, no tenderness at McBurney's point and negative Murphy's sign.  Musculoskeletal: She exhibits no edema.  Lymphadenopathy:    She has no cervical adenopathy.  Neurological: She is alert  and oriented to person, place, and time.  Skin: Skin is warm and dry. Capillary refill takes less than 2 seconds. No erythema.  Psychiatric: She has a normal mood and affect. Her behavior is normal. Judgment and thought content normal.       Assessment & Plan:  1. Gastroesophageal reflux disease, esophagitis presence not specified Symptoms are most consistent with reflux and triggers of smoking, caffeine, carbonated beverages, and fried/spicy foods. We discussed avoidance of food triggers and tobacco cessation. She is not interested in tobacco cessation at this time. Trial of omeprazole will be  initiated for 4 weeks with focus of dietary changes. Advised follow up in 4 weeks with PCP or sooner if needed. We discussed if symptoms persisted, a referral to GI can also be considered. Priority of dietary changes and avoidance of triggers was reviewed. Return precautions provided.   - omeprazole (PRILOSEC) 20 MG capsule; Take 1 capsule (20 mg total) by mouth daily.  Dispense: 30 capsule; Refill: 3  Roddie McJulia Marinell Igarashi, FNP-C

## 2018-05-22 ENCOUNTER — Ambulatory Visit (INDEPENDENT_AMBULATORY_CARE_PROVIDER_SITE_OTHER): Payer: BLUE CROSS/BLUE SHIELD | Admitting: Family Medicine

## 2018-05-22 ENCOUNTER — Encounter: Payer: Self-pay | Admitting: Family Medicine

## 2018-05-22 VITALS — BP 118/74 | HR 77 | Temp 98.0°F | Resp 16 | Wt 152.4 lb

## 2018-05-22 DIAGNOSIS — K219 Gastro-esophageal reflux disease without esophagitis: Secondary | ICD-10-CM

## 2018-05-22 MED ORDER — OMEPRAZOLE 20 MG PO CPDR: 20.0000 mg | DELAYED_RELEASE_CAPSULE | Freq: Every day | ORAL | 3 refills | Status: DC

## 2018-05-22 NOTE — Patient Instructions (Signed)
Please take medication with water 30 minutes before eating.  See food choices below and keep a diary of foods that are causing symptoms.  Follow up in 4 weeks or sooner if needed.  Home Care:  May include lifestyle changes such as weight loss, quitting smoking and alcohol consumption  Avoid foods and drinks that make your symptoms worse, such as:  Caffeine or alcoholic drinks  Chocolate  Peppermint or mint flavorings  Garlic and onions  Spicy foods  Citrus fruits, such as oranges, lemons, or limes  Tomato-based foods such as sauce, chili, salsa and pizza  Fried and fatty foods  Avoid lying down for 3 hours prior to your bedtime or prior to taking a nap  Eat small, frequent meals instead of a large meals  Wear loose-fitting clothing.  Do not wear anything tight around your waist that causes pressure on your stomach.  Raise the head of your bed 6 to 8 inches with wood blocks to help you sleep.  Extra pillows will not help.  Seek Help Right Away If:  You have pain in your arms, neck, jaw, teeth or back  Your pain increases or changes in intensity or duration  You develop nausea, vomiting or sweating (diaphoresis)  You develop shortness of breath or you faint  Your vomit is green, yellow, black or looks like coffee grounds or blood  Your stool is red, bloody or black  These symptoms could be signs of other problems, such as heart disease, gastric bleeding or esophageal bleeding.  Make sure you :  Understand these instructions.  Will watch your condition.  Will get help right away if you are not doing well or get worse.    Food Choices for Gastroesophageal Reflux Disease, Adult When you have gastroesophageal reflux disease (GERD), the foods you eat and your eating habits are very important. Choosing the right foods can help ease your discomfort. What guidelines do I need to follow?  Choose fruits, vegetables, whole grains, and low-fat dairy  products.  Choose low-fat meat, fish, and poultry.  Limit fats such as oils, salad dressings, butter, nuts, and avocado.  Keep a food diary. This helps you identify foods that cause symptoms.  Avoid foods that cause symptoms. These may be different for everyone.  Eat small meals often instead of 3 large meals a day.  Eat your meals slowly, in a place where you are relaxed.  Limit fried foods.  Cook foods using methods other than frying.  Avoid drinking alcohol.  Avoid drinking large amounts of liquids with your meals.  Avoid bending over or lying down until 2-3 hours after eating. What foods are not recommended? These are some foods and drinks that may make your symptoms worse: Vegetables Tomatoes. Tomato juice. Tomato and spaghetti sauce. Chili peppers. Onion and garlic. Horseradish. Fruits Oranges, grapefruit, and lemon (fruit and juice). Meats High-fat meats, fish, and poultry. This includes hot dogs, ribs, ham, sausage, salami, and bacon. Dairy Whole milk and chocolate milk. Sour cream. Cream. Butter. Ice cream. Cream cheese. Drinks Coffee and tea. Bubbly (carbonated) drinks or energy drinks. Condiments Hot sauce. Barbecue sauce. Sweets/Desserts Chocolate and cocoa. Donuts. Peppermint and spearmint. Fats and Oils High-fat foods. This includes [place] fries and potato chips. Other Vinegar. Strong spices. This includes black pepper, white pepper, red pepper, cayenne, curry powder, cloves, ginger, and chili powder. The items listed above may not be a complete list of foods and drinks to avoid. Contact your dietitian for more information. This information is  not intended to replace advice given to you by your health care provider. Make sure you discuss any questions you have with your health care provider. Document Released: 04/22/2012 Document Revised: 03/29/2016 Document Reviewed: 08/26/2013 Elsevier Interactive Patient Education  2017 [employer]

## 2018-06-20 DIAGNOSIS — R102 Pelvic and perineal pain: Secondary | ICD-10-CM | POA: Diagnosis not present

## 2018-09-29 ENCOUNTER — Encounter: Payer: Self-pay | Admitting: Family Medicine

## 2018-09-29 ENCOUNTER — Ambulatory Visit (INDEPENDENT_AMBULATORY_CARE_PROVIDER_SITE_OTHER): Payer: BLUE CROSS/BLUE SHIELD | Admitting: Family Medicine

## 2018-09-29 VITALS — BP 120/72 | HR 94 | Temp 98.0°F | Ht 62.0 in | Wt 154.2 lb

## 2018-09-29 DIAGNOSIS — R059 Cough, unspecified: Secondary | ICD-10-CM

## 2018-09-29 DIAGNOSIS — R05 Cough: Secondary | ICD-10-CM | POA: Diagnosis not present

## 2018-09-29 DIAGNOSIS — R0981 Nasal congestion: Secondary | ICD-10-CM

## 2018-09-29 DIAGNOSIS — J00 Acute nasopharyngitis [common cold]: Secondary | ICD-10-CM

## 2018-09-29 DIAGNOSIS — R509 Fever, unspecified: Secondary | ICD-10-CM

## 2018-09-29 MED ORDER — BENZONATATE 100 MG PO CAPS: 100.0000 mg | ORAL_CAPSULE | Freq: Three times a day (TID) | ORAL | 0 refills | Status: DC | PRN

## 2018-09-29 NOTE — Patient Instructions (Signed)
Rest, increase fluid intake, do good handwashing.  Use Tessalon Perles as needed to help cough.  Can use over-the-counter antihistamine such as Claritin, Zyrtec or Allegra as needed to help dry up congestion

## 2018-09-29 NOTE — Progress Notes (Signed)
   Subjective:    Patient ID: Connie Fernandez, female    DOB: Jan 14, 1996, 22 y.o.   MRN: [id number]  HPI   Patient presents to clinic with fever, cough,, sinus and ear congestion for 1 day.  She has not taken any over-the-counter allergy or cold medicines.  Denies any known sick contacts.  Denies shortness of breath or wheezing.  Denies nausea, vomiting or diarrhea.  Patient Active Problem List   Diagnosis Date Noted  . Acute cystitis with hematuria 07/25/2017  . History of drug use 07/25/2017  . Acne 02/19/2017  . Dysmenorrhea 02/19/2017  . Chronic pelvic pain in female 07/16/2016  . Tobacco use disorder 10/28/2015  . Panic disorder 10/28/2015  . Major depressive disorder, recurrent episode (HCC) 10/28/2015   Social History   Tobacco Use  . Smoking status: Current Every Day Smoker    Packs/day: 0.00    Years: 1.00    Pack years: 0.00    Types: E-cigarettes  . Smokeless tobacco: Never Used  Substance Use Topics  . Alcohol use: No    Alcohol/week: 0.0 standard drinks    Review of Systems  Constitutional: Negative for chills, fatigue and fever.  HENT: Positive congestion, ear pain. Eyes: Negative.   Respiratory: Positive cough.  Negative for shortness of breath and wheezing.   Cardiovascular: Negative for chest pain, palpitations and leg swelling.  Gastrointestinal: Negative for abdominal pain, diarrhea, nausea and vomiting.  Genitourinary: Negative for dysuria, frequency and urgency.  Musculoskeletal: Negative for arthralgias and myalgias.  Skin: Negative for color change, pallor and rash.  Neurological: Negative for syncope, light-headedness and headaches.  Psychiatric/Behavioral: The patient is not nervous/anxious.       Objective:   Physical Exam  Physical Exam  Constitutional: She is oriented to person, place, and time. She appears well-developed and well-nourished. No distress.  HENT:  Head: Normocephalic and atraumatic.  Ears: Some fullness bilateral TMs,  but they are not bulging or red. Nose/throat: Mild postnasal drip present.  Tonsils are not swollen, no exudate on tonsils Eyes: Pupils are equal, round, and reactive to light. EOM are normal. No scleral icterus.  Neck: Normal range of motion. Neck supple. No tracheal deviation present.  Cardiovascular: Normal rate, regular rhythm and normal heart sounds.  Pulmonary/Chest: Effort normal and breath sounds normal. No respiratory distress. She has no wheezes. She has no rales.  Neurological: She is alert and oriented to person, place, and time.  Gait normal  Skin: Skin is warm and dry. No pallor.  Psychiatric: She has a normal mood and affect. Her behavior is normal. Thought content normal.  Nursing note and vitals reviewed     Vitals:   09/29/18 1534  BP: 120/72  Pulse: 94  Temp: 98 F (36.7 C)  SpO2: 99%     Assessment & Plan:   Common cold, nasal congestion, fever, cough- flu AB swab negative in clinic.  Patient advised that her symptoms are most likely result of a common cold virus.  Advised to do good handwashing, increase fluid intake, get good rest.  She is being prescribed Tessalon Perles to use as needed for cough.  Also advised she can use an over-the-counter antihistamine such as Claritin, Zyrtec or Allegra to help dry up congestion.  Keep regular appointment as planned with PCP.  Return to clinic sooner if any issues arise.

## 2018-09-30 LAB — POC INFLUENZA A&B (BINAX/QUICKVUE)
Influenza A, POC: NEGATIVE
Influenza B, POC: NEGATIVE

## 2018-10-13 ENCOUNTER — Ambulatory Visit (INDEPENDENT_AMBULATORY_CARE_PROVIDER_SITE_OTHER): Payer: BLUE CROSS/BLUE SHIELD | Admitting: Family

## 2018-10-13 ENCOUNTER — Encounter: Payer: Self-pay | Admitting: Family

## 2018-10-13 VITALS — BP 100/62 | HR 94 | Temp 98.1°F | Wt 151.2 lb

## 2018-10-13 DIAGNOSIS — R5383 Other fatigue: Secondary | ICD-10-CM | POA: Insufficient documentation

## 2018-10-13 MED ORDER — TRAZODONE HCL 50 MG PO TABS: 25.0000 mg | ORAL_TABLET | Freq: Every evening | ORAL | 1 refills | Status: DC | PRN

## 2018-10-13 NOTE — Patient Instructions (Addendum)
Trial of low dose trazodone.  Essentials for good sleep:   #1 Exercise #2 Limit Caffeine ( no caffeine after lunch) #3 No smart phones, TV prior to bed -- BLUE light is VERY activating and send the brain an 'awake message.'  #4 Go to bed at same time of night each night and get up at same time of day.  #5 Take 0.5 to 5mg  melatonin at 7pm with dinner -this is when natural melatonin will start to increase #6 May try over the counter Unisom for sleep aid

## 2018-10-13 NOTE — Assessment & Plan Note (Signed)
Suspect multifactorial, related to shift work, sleep latency, lack of exercise.  Pending lab evaluation for metabolic etiology.  Patient's history of cutting, she states  that she is no longer doing this.  She does not feel like she has depression, here today for her concern for trouble sleeping, fatigue.  trial of trazodone for insomnia.  She been on Zoloft in the past she does not want you back on this medication.   Close follow-up.

## 2018-10-13 NOTE — Progress Notes (Signed)
Subjective:    Patient ID: Connie PortelaJessica Nuttle, female    DOB: [birth year], 22 y.o.   MRN: [id number]  CC: Connie PortelaJessica Ivie is a 22 y.o. female who presents today for follow up.   HPI: Fatigue x 3 months, unchanged.   Accompanied by mom today.   Always had trouble falling asleep.May take an hour. Notes rroken sleep, approx 5-6 hours per night.  Thinking a lot at bedtime.  Tried zoloft in the past did work.  No si/hi. No longer cutting. 'doesn't feel depressed.'   Not refreshed after nights sleep.   Started new job at [employer], one month ago. goes in at 5pm and gets of at 2am.  Bedtime at 2am.   No longer using alcohol  Continues to smoke  No exercise. Caffeine - coffee - venti starbucks and soda- multiple /day.     10/2015  Suicide attempt.  Cutting since 22 years old. Saw counselor at that time, [name]. HISTORY:  Past Medical History:  Diagnosis Date  . Anemia   . Anxiety   . Depression   . Dysmenorrhea in adolescent   . Endometriosis   . Headache    Migraines  . Heart palpitations   . Shortness of breath dyspnea    with exertion  . UTI (lower urinary tract infection)    Past Surgical History:  Procedure Laterality Date  . LAPAROSCOPY N/A 06/12/2016   Procedure: LAPAROSCOPY DIAGNOSTIC;  Surgeon: Allie BossierMyra C Dove, MD;  Location: [facility];  Service: Gynecology;  Laterality: N/A;   Family History  Problem Relation Age of Onset  . Diabetes Mother   . Alcohol abuse Father   . Hypertension Father   . COPD Maternal Grandmother   . Early death Maternal Grandmother 1952  . Heart disease Maternal Grandmother   . Heart disease Maternal Grandfather 72  . Parkinsonism Paternal Grandmother     Allergies: Latex and Penicillins No current outpatient medications on file prior to visit.   No current facility-administered medications on file prior to visit.     Social History   Tobacco Use  . Smoking status: Current Every Day Smoker    Packs/day: 0.00    Years: 1.00    Pack  years: 0.00    Types: E-cigarettes  . Smokeless tobacco: Never Used  Substance Use Topics  . Alcohol use: No    Alcohol/week: 0.0 standard drinks  . Drug use: No    Review of Systems  Constitutional: Positive for fatigue. Negative for chills and fever.  Respiratory: Negative for cough.   Cardiovascular: Negative for chest pain and palpitations.  Gastrointestinal: Negative for nausea and vomiting.  Psychiatric/Behavioral: Positive for sleep disturbance. Negative for self-injury and suicidal ideas. The patient is nervous/anxious.       Objective:    BP 100/62 (BP Location: Left Arm, Patient Position: Sitting, Cuff Size: Normal)   Pulse 94   Temp 98.1 F (36.7 C)   Wt 151 lb 3.2 oz (68.6 kg)   SpO2 98%   BMI 27.65 kg/m  BP Readings from Last 3 Encounters:  10/13/18 100/62  09/29/18 120/72  05/22/18 118/74   Wt Readings from Last 3 Encounters:  10/13/18 151 lb 3.2 oz (68.6 kg)  09/29/18 154 lb 3.2 oz (69.9 kg)  05/22/18 152 lb 6.4 oz (69.1 kg)    Physical Exam  Constitutional: She appears well-developed and well-nourished.  Eyes: Conjunctivae are normal.  Neck: No thyroid mass and no thyromegaly present.  Cardiovascular: Normal rate, regular rhythm, normal heart sounds  and normal pulses.  Pulmonary/Chest: Effort normal and breath sounds normal. She has no wheezes. She has no rhonchi. She has no rales.  Lymphadenopathy:       Head (right side): No submental, no submandibular, no tonsillar, no preauricular, no posterior auricular and no occipital adenopathy present.       Head (left side): No submental, no submandibular, no tonsillar, no preauricular, no posterior auricular and no occipital adenopathy present.    She has no cervical adenopathy.  Neurological: She is alert.  Skin: Skin is warm and dry.  Psychiatric: She has a normal mood and affect. Her speech is normal and behavior is normal. Thought content normal.  Vitals reviewed.      Assessment & Plan:    Problem List Items Addressed This Visit      Other   Other fatigue - Primary    Suspect multifactorial, related to shift work, sleep latency, lack of exercise.  Pending lab evaluation for metabolic etiology.  Patient's history of cutting, she states  that she is no longer doing this.  She does not feel like she has depression, here today for her concern for trouble sleeping, fatigue.  trial of trazodone for insomnia.  She been on Zoloft in the past she does not want you back on this medication.   Close follow-up.      Relevant Medications   traZODone (DESYREL) 50 MG tablet   Other Relevant Orders   CBC with Differential/Platelet   Comprehensive metabolic panel   Hemoglobin A1c   VITAMIN D 25 Hydroxy (Vit-D Deficiency, Fractures)   TSH       I have discontinued Chenita Buchholz's benzonatate. I am also having her start on traZODone.   Meds ordered this encounter  Medications  . traZODone (DESYREL) 50 MG tablet    Sig: Take 0.5-1 tablets (25-50 mg total) by mouth at bedtime as needed for sleep.    Dispense:  90 tablet    Refill:  1    Order Specific Question:   Supervising Provider    Answer:   Sherlene Shams [[id number]]    Return precautions given.   Risks, benefits, and alternatives of the medications and treatment plan prescribed today were discussed, and patient expressed understanding.   Education regarding symptom management and diagnosis given to patient on AVS.  Continue to follow with Allegra Grana, FNP for routine health maintenance.   Connie Fernandez and I agreed with plan.   Rennie Plowman, FNP

## 2018-10-14 LAB — CBC WITH DIFFERENTIAL/PLATELET
Basophils Absolute: 0.2 10*3/uL — ABNORMAL HIGH (ref 0.0–0.1)
Basophils Relative: 2.1 % (ref 0.0–3.0)
Eosinophils Absolute: 0.7 10*3/uL (ref 0.0–0.7)
Eosinophils Relative: 10.3 % — ABNORMAL HIGH (ref 0.0–5.0)
HCT: 40.1 % (ref 36.0–46.0)
Hemoglobin: 13.1 g/dL (ref 12.0–15.0)
Lymphocytes Relative: 26.2 % (ref 12.0–46.0)
Lymphs Abs: 1.9 10*3/uL (ref 0.7–4.0)
MCHC: 32.6 g/dL (ref 30.0–36.0)
MCV: 81.9 fl (ref 78.0–100.0)
Monocytes Absolute: 0.5 10*3/uL (ref 0.1–1.0)
Monocytes Relative: 7.4 % (ref 3.0–12.0)
Neutro Abs: 3.9 10*3/uL (ref 1.4–7.7)
Neutrophils Relative %: 54 % (ref 43.0–77.0)
Platelets: 287 10*3/uL (ref 150.0–400.0)
RBC: 4.9 Mil/uL (ref 3.87–5.11)
RDW: 15.6 % — ABNORMAL HIGH (ref 11.5–15.5)
WBC: 7.1 10*3/uL (ref 4.0–10.5)

## 2018-10-14 LAB — COMPREHENSIVE METABOLIC PANEL
ALT: 11 U/L (ref 0–35)
AST: 15 U/L (ref 0–37)
Albumin: 4.6 g/dL (ref 3.5–5.2)
Alkaline Phosphatase: 51 U/L (ref 39–117)
BUN: 8 mg/dL (ref 6–23)
CO2: 26 mEq/L (ref 19–32)
Calcium: 9.2 mg/dL (ref 8.4–10.5)
Chloride: 107 mEq/L (ref 96–112)
Creatinine, Ser: 0.79 mg/dL (ref 0.40–1.20)
GFR: 96.47 mL/min (ref >60.00)
Glucose, Bld: 77 mg/dL (ref 70–99)
Potassium: 4.2 mEq/L (ref 3.5–5.1)
Sodium: 139 mEq/L (ref 135–145)
Total Bilirubin: 0.4 mg/dL (ref 0.2–1.2)
Total Protein: 7.1 g/dL (ref 6.0–8.3)

## 2018-10-14 LAB — TSH: TSH: 0.44 u[IU]/mL (ref 0.35–4.50)

## 2018-10-14 LAB — HEMOGLOBIN A1C: Hgb A1c MFr Bld: 5.6 % (ref 4.6–6.5)

## 2018-10-14 LAB — VITAMIN D 25 HYDROXY (VIT D DEFICIENCY, FRACTURES): VITD: 12.9 ng/mL — ABNORMAL LOW (ref 30.00–100.00)

## 2018-10-24 ENCOUNTER — Telehealth: Payer: Self-pay | Admitting: Family

## 2018-10-24 NOTE — Telephone Encounter (Signed)
Patient called, no answer, no mailbox set up, unable to leave message.

## 2018-10-24 NOTE — Telephone Encounter (Signed)
Copied from CRM [id number]. Topic: Quick Communication - Lab Results (Clinic Use ONLY) >> Oct 24, 2018  4:08 PM Jilda Rocheemaray, [name] wrote: Patient calling for lab results  Best call back is [phone]

## 2018-10-30 NOTE — Telephone Encounter (Signed)
Pt. Given lab results and instructions.Verbalizes understanding. Will call back to set up OV and lab visit.

## 2018-11-20 ENCOUNTER — Telehealth: Payer: Self-pay | Admitting: Family

## 2018-11-20 NOTE — Telephone Encounter (Signed)
Copied from CRM [id number]. Topic: General - Other >> Nov 20, 2018  4:25 PM Fanny Bien wrote: Reason for CRM: pt called and state that she has a note originally from dr cook that states that she will need 2-3 days off work every month because of her menstrual cycle. Pt would like a new note for work.

## 2018-11-21 NOTE — Telephone Encounter (Signed)
Call patient Please advise  that she makes a f/u so we can discuss this.

## 2018-11-21 NOTE — Telephone Encounter (Signed)
Patient has made f/u to discuss.

## 2018-11-26 ENCOUNTER — Ambulatory Visit: Payer: Self-pay | Admitting: Family

## 2018-11-26 DIAGNOSIS — Z0289 Encounter for other administrative examinations: Secondary | ICD-10-CM

## 2018-12-27 ENCOUNTER — Encounter: Payer: Self-pay | Admitting: Emergency Medicine

## 2018-12-27 ENCOUNTER — Other Ambulatory Visit: Payer: Self-pay

## 2018-12-27 ENCOUNTER — Emergency Department
Admission: EM | Admit: 2018-12-27 | Discharge: 2018-12-27 | Disposition: A | Discharge status: Home / Self Care | Payer: BLUE CROSS/BLUE SHIELD | Attending: Emergency Medicine | Admitting: Emergency Medicine

## 2018-12-27 DIAGNOSIS — F17210 Nicotine dependence, cigarettes, uncomplicated: Secondary | ICD-10-CM | POA: Diagnosis not present

## 2018-12-27 DIAGNOSIS — J111 Influenza due to unidentified influenza virus with other respiratory manifestations: Secondary | ICD-10-CM | POA: Diagnosis not present

## 2018-12-27 DIAGNOSIS — J1189 Influenza due to unidentified influenza virus with other manifestations: Secondary | ICD-10-CM | POA: Diagnosis not present

## 2018-12-27 DIAGNOSIS — Z79899 Other long term (current) drug therapy: Secondary | ICD-10-CM | POA: Insufficient documentation

## 2018-12-27 DIAGNOSIS — R509 Fever, unspecified: Secondary | ICD-10-CM | POA: Diagnosis not present

## 2018-12-27 MED ORDER — ONDANSETRON 4 MG PO TBDP: 4.0000 mg | ORAL_TABLET | Freq: Three times a day (TID) | ORAL | 0 refills | Status: DC | PRN

## 2018-12-27 NOTE — ED Triage Notes (Signed)
Sore throat x 3 days

## 2018-12-27 NOTE — ED Provider Notes (Signed)
[facility] Emergency Department Provider Note  ____________________________________________  Time seen: Approximately 4:20 PM  I have reviewed the triage vital signs and the nursing notes.   HISTORY  Chief Complaint Sore Throat    HPI Connie Fernandez is a 23 y.o. female who presents the emergency department complaining of multiple flulike symptoms to include bodies, fevers, nasal congestion, sore throat, cough.  Patient states that she has been exposed to both her sister and her nephew who are both positive for influenza at this time.  Patient is taking over-the-counter medication with some relief.  Patient denies any headache, neck pain or stiffness, chest pain, abdominal pain, diarrhea or constipation.  Patient has had 2 episodes of emesis.    Past Medical History:  Diagnosis Date  . Anemia   . Anxiety   . Depression   . Dysmenorrhea in adolescent   . Endometriosis   . Headache    Migraines  . Heart palpitations   . Shortness of breath dyspnea    with exertion  . UTI (lower urinary tract infection)     Patient Active Problem List   Diagnosis Date Noted  . Other fatigue 10/13/2018  . Acute cystitis with hematuria 07/25/2017  . History of drug use 07/25/2017  . Acne 02/19/2017  . Dysmenorrhea 02/19/2017  . Chronic pelvic pain in female 07/16/2016  . Tobacco use disorder 10/28/2015  . Panic disorder 10/28/2015  . Major depressive disorder, recurrent episode (HCC) 10/28/2015    Past Surgical History:  Procedure Laterality Date  . LAPAROSCOPY N/A 06/12/2016   Procedure: LAPAROSCOPY DIAGNOSTIC;  Surgeon: Allie Bossier, MD;  Location: [facility];  Service: Gynecology;  Laterality: N/A;    Prior to Admission medications   Medication Sig Start Date End Date Taking? Authorizing Provider  ondansetron (ZOFRAN-ODT) 4 MG disintegrating tablet Take 1 tablet (4 mg total) by mouth every 8 (eight) hours as needed for nausea or vomiting. 12/27/18   Faolan Springfield,  Delorise Royals, PA-C  traZODone (DESYREL) 50 MG tablet Take 0.5-1 tablets (25-50 mg total) by mouth at bedtime as needed for sleep. 10/13/18   Allegra Grana, FNP    Allergies Latex and Penicillins  Family History  Problem Relation Age of Onset  . Diabetes Mother   . Alcohol abuse Father   . Hypertension Father   . COPD Maternal Grandmother   . Early death Maternal Grandmother 93  . Heart disease Maternal Grandmother   . Heart disease Maternal Grandfather 72  . Parkinsonism Paternal Grandmother     Social History Social History   Tobacco Use  . Smoking status: Current Every Day Smoker    Packs/day: 0.00    Years: 1.00    Pack years: 0.00    Types: E-cigarettes  . Smokeless tobacco: Never Used  Substance Use Topics  . Alcohol use: No    Alcohol/week: 0.0 standard drinks  . Drug use: No     Review of Systems  Constitutional: Positive fever/chills Eyes: No visual changes. No discharge ENT: Positive for nasal congestion and sore throat Cardiovascular: no chest pain. Respiratory: Positive cough. No SOB. Gastrointestinal: No abdominal pain.  Positive for nausea and emesis..  No diarrhea.  No constipation. Musculoskeletal: Negative for musculoskeletal pain. Skin: Negative for rash, abrasions, lacerations, ecchymosis. Neurological: Negative for headaches, focal weakness or numbness. 10-point ROS otherwise negative.  ____________________________________________   PHYSICAL EXAM:  VITAL SIGNS: ED Triage Vitals [12/27/18 1532]  Enc Vitals Group     BP (!) 119/58  Pulse Rate 84     Resp 20     Temp 98.3 F (36.8 C)     Temp Source Oral     SpO2 98 %     Weight 150 lb (68 kg)     Height  (1.575 m)     Head Circumference      Peak Flow      Pain Score 8     Pain Loc      Pain Edu?      Excl. in GC?      Constitutional: Alert and oriented. Well appearing and in no acute distress. Eyes: Conjunctivae are normal. PERRL. EOMI. Head: Atraumatic. ENT:       Ears: EACs and TMs unremarkable bilaterally.      Nose: Mild congestion/rhinnorhea.      Mouth/Throat: Mucous membranes are moist.  Oropharynx is minimally erythematous but nonedematous.  No exudates.  Tonsils midline.  No indication of deep space infection to include unequal tonsils concerning for peritonsillar abscess, uvular deviation, submandibular or neck edema concerning for Ludwick's angina. Neck: No stridor.  Neck is supple full range of motion Hematological/Lymphatic/Immunilogical: No cervical lymphadenopathy. Cardiovascular: Normal rate, regular rhythm. Normal S1 and S2.  Good peripheral circulation. Respiratory: Normal respiratory effort without tachypnea or retractions. Lungs CTAB. Good air entry to the bases with no decreased or absent breath sounds. Gastrointestinal: Bowel sounds 4 quadrants. Soft and nontender to palpation. No guarding or rigidity. No palpable masses. No distention. No CVA tenderness. Musculoskeletal: Full range of motion to all extremities. No gross deformities appreciated. Neurologic:  Normal speech and language. No gross focal neurologic deficits are appreciated.  Skin:  Skin is warm, dry and intact. No rash noted. Psychiatric: Mood and affect are normal. Speech and behavior are normal. Patient exhibits appropriate insight and judgement.   ____________________________________________   LABS (all labs ordered are listed, but only abnormal results are displayed)  Labs Reviewed - No data to display ____________________________________________  EKG   ____________________________________________  RADIOLOGY   No results found.  ____________________________________________    PROCEDURES  Procedure(s) performed:    Procedures    Medications - No data to display   ____________________________________________   INITIAL IMPRESSION / ASSESSMENT AND PLAN / ED COURSE  Pertinent labs & imaging results that were available during my care of  the patient were reviewed by me and considered in my medical decision making (see chart for details).  Review of the [place] CSRS was performed in accordance of the [facility] prior to dispensing any controlled drugs.      Patient's diagnosis is consistent with influenza.  Patient presents emergency department with flulike symptoms after known exposure to both her sister and her nephew who are positive for influenza.  Differential included viral URI, strep pharyngitis, bronchitis, pneumonia, gastroenteritis.  Overall, exam was reassuring with no significant acute findings.  At this time, patient is outside of Tamiflu window and will not be prescribed Tamiflu.  Patient will be diagnosed on symptoms without swab..  Tylenol Motrin at home for symptoms.  Plenty of fluids.  Patient will have a prescription for Zofran for nausea and emesis.  Follow-up primary care as needed.  Patient is given ED precautions to return to the ED for any worsening or new symptoms.     ____________________________________________  FINAL CLINICAL IMPRESSION(S) / ED DIAGNOSES  Final diagnoses:  Influenza      NEW MEDICATIONS STARTED DURING THIS VISIT:  ED Discharge Orders  Ordered    ondansetron (ZOFRAN-ODT) 4 MG disintegrating tablet  Every 8 hours PRN     12/27/18 1706              This chart was dictated using voice recognition software/Dragon. Despite best efforts to proofread, errors can occur which can change the meaning. Any change was purely unintentional.    Racheal Patches, PA-C 12/27/18 1732    Sharman Cheek, MD 01/02/19 [phone]

## 2019-02-11 ENCOUNTER — Ambulatory Visit: Payer: Self-pay | Admitting: *Deleted

## 2019-02-11 ENCOUNTER — Other Ambulatory Visit: Payer: Self-pay

## 2019-02-11 NOTE — Telephone Encounter (Signed)
Woke  Up this morning feeling chest pressure at the breast level. Headaches constantly for one week, took tylenol for couple of days-did not help. Denies N/V.Loose stools yesterday-5-6 mostly watery for 5-6 days. Dry cough is a smoker. Having coughing spells daily. No fever, no difficulty breathing. No travels. Came in contact with person over one week ago that was assisting others that were known positives. Last worked at [employer]. Routing to PCP for possible appointment. Reason for Disposition . [1] MODERATE headache (e.g., interferes with normal activities) AND [2] present > 24 hours AND [3] unexplained  (Exceptions: analgesics not tried, typical migraine, or headache part of viral illness)  Answer Assessment - Initial Assessment Questions 1. LOCATION: "Where does it hurt?"      Top of head and forehead area 2. ONSET: "When did the headache start?" (Minutes, hours or days)      Everyday for one week.  3. PATTERN: "Does the pain come and go, or has it been constant since it started?"     constant 4. SEVERITY: "How bad is the pain?" and "What does it keep you from doing?"  (e.g., Scale 1-10; mild, moderate, or severe)   - MILD (1-3): doesn't interfere with normal activities    - MODERATE (4-7): interferes with normal activities or awakens from sleep    - SEVERE (8-10): excruciating pain, unable to do any normal activities        Headache is a 6 5. RECURRENT SYMPTOM: "Have you ever had headaches before?" If so, ask: "When was the last time?" and "What happened that time?"     no 6. CAUSE: "What do you think is causing the headache?"     unsure 7. MIGRAINE: "Have you been diagnosed with migraine headaches?" If so, ask: "Is this headache similar?"     no 8. HEAD INJURY: "Has there been any recent injury to the head?"      no 9. OTHER SYMPTOMS: "Do you have any other symptoms?" (fever, stiff neck, eye pain, sore throat, cold symptoms)    no 10. PREGNANCY: "Is there any chance you  are pregnant?" "When was your last menstrual period?"       no  Protocols used: HEADACHE-A-AH

## 2019-02-11 NOTE — Telephone Encounter (Signed)
Please follow pt and ensure she is in ED  Let me know if you dont see her there; then call and ensure she does go

## 2019-02-11 NOTE — Telephone Encounter (Signed)
Patient was advised by front desk to go to the ER.

## 2019-02-11 NOTE — Telephone Encounter (Signed)
I called to circle back with patient as didn't see she had went to ED. She stated that her HA was not worse and she didn't feel she needed to be seen in ED today. She told me she could not talk about her symptoms right now. ( ?) Sounded like someone was with her at the time.   I advised her if she did not feel she needed to be seen in emergency room, that she could call the office in the morning for a virtual appointment with L auren. She said that she would do that. If you would please circle back with this patient to ensure she does call the office and we are able to schedule a WebEx. Keep me posted, thank you!

## 2019-02-12 ENCOUNTER — Encounter: Payer: Self-pay | Admitting: Emergency Medicine

## 2019-02-12 ENCOUNTER — Other Ambulatory Visit: Payer: Self-pay

## 2019-02-12 ENCOUNTER — Emergency Department
Admission: EM | Admit: 2019-02-12 | Discharge: 2019-02-12 | Disposition: A | Discharge status: Home / Self Care | Payer: BLUE CROSS/BLUE SHIELD | Attending: Emergency Medicine | Admitting: Emergency Medicine

## 2019-02-12 DIAGNOSIS — F17290 Nicotine dependence, other tobacco product, uncomplicated: Secondary | ICD-10-CM | POA: Diagnosis not present

## 2019-02-12 DIAGNOSIS — R197 Diarrhea, unspecified: Secondary | ICD-10-CM | POA: Diagnosis not present

## 2019-02-12 DIAGNOSIS — Z91040 Latex allergy status: Secondary | ICD-10-CM | POA: Diagnosis not present

## 2019-02-12 DIAGNOSIS — R51 Headache: Secondary | ICD-10-CM | POA: Insufficient documentation

## 2019-02-12 DIAGNOSIS — R05 Cough: Secondary | ICD-10-CM | POA: Insufficient documentation

## 2019-02-12 DIAGNOSIS — R519 Headache, unspecified: Secondary | ICD-10-CM

## 2019-02-12 DIAGNOSIS — Z880 Allergy status to penicillin: Secondary | ICD-10-CM | POA: Diagnosis not present

## 2019-02-12 DIAGNOSIS — R059 Cough, unspecified: Secondary | ICD-10-CM

## 2019-02-12 NOTE — Discharge Instructions (Signed)
Please seek medical attention for any high fevers, chest pain, shortness of breath, change in behavior, persistent vomiting, bloody stool or any other new or concerning symptoms.  

## 2019-02-12 NOTE — Telephone Encounter (Signed)
Can we call patient and get her set up for virtual visit today to discuss symptoms  Please set up Doxy visit

## 2019-02-12 NOTE — ED Provider Notes (Signed)
[facility] Emergency Department Provider Note  ____________________________________________   I have reviewed the triage vital signs and the nursing notes.   HISTORY  Chief Complaint Cough   History limited by: Not Limited   HPI Connie Fernandez is a 23 y.o. female who presents to the emergency department today as directed by PCP to get COVID test. The patient states that for the past week she has been having some headache, diarrhea and cough. She is a smoker. Yesterday had an episode of chest pressure. States that she has been exposed to someone who was exposed to someone who tested positive for COVID. The patient denies any fevers. Has vomited one time.   Records reviewed. Per medical record review patient has a history of headaches, depression.   Past Medical History:  Diagnosis Date  . Anemia   . Anxiety   . Depression   . Dysmenorrhea in adolescent   . Endometriosis   . Headache    Migraines  . Heart palpitations   . Shortness of breath dyspnea    with exertion  . UTI (lower urinary tract infection)     Patient Active Problem List   Diagnosis Date Noted  . Other fatigue 10/13/2018  . Acute cystitis with hematuria 07/25/2017  . History of drug use 07/25/2017  . Acne 02/19/2017  . Dysmenorrhea 02/19/2017  . Chronic pelvic pain in female 07/16/2016  . Tobacco use disorder 10/28/2015  . Panic disorder 10/28/2015  . Major depressive disorder, recurrent episode (HCC) 10/28/2015    Past Surgical History:  Procedure Laterality Date  . LAPAROSCOPY N/A 06/12/2016   Procedure: LAPAROSCOPY DIAGNOSTIC;  Surgeon: Allie Bossier, MD;  Location: [facility];  Service: Gynecology;  Laterality: N/A;    Prior to Admission medications   Medication Sig Start Date End Date Taking? Authorizing Provider  ondansetron (ZOFRAN-ODT) 4 MG disintegrating tablet Take 1 tablet (4 mg total) by mouth every 8 (eight) hours as needed for nausea or vomiting. 12/27/18    Cuthriell, Delorise Royals, PA-C  traZODone (DESYREL) 50 MG tablet Take 0.5-1 tablets (25-50 mg total) by mouth at bedtime as needed for sleep. 10/13/18   Allegra Grana, FNP    Allergies Latex and Penicillins  Family History  Problem Relation Age of Onset  . Diabetes Mother   . Alcohol abuse Father   . Hypertension Father   . COPD Maternal Grandmother   . Early death Maternal Grandmother 55  . Heart disease Maternal Grandmother   . Heart disease Maternal Grandfather 72  . Parkinsonism Paternal Grandmother     Social History Social History   Tobacco Use  . Smoking status: Current Every Day Smoker    Packs/day: 0.00    Years: 1.00    Pack years: 0.00    Types: E-cigarettes  . Smokeless tobacco: Never Used  Substance Use Topics  . Alcohol use: No    Alcohol/week: 0.0 standard drinks  . Drug use: No    Review of Systems Constitutional: No fever/chills Eyes: No visual changes. ENT: No sore throat. Cardiovascular: Positive for episode of chest pressure yesterday.  Respiratory: Positive for cough.  Gastrointestinal: No abdominal pain.  Positive for one episode of vomiting. Positive for watery diarrhea.  Genitourinary: Negative for dysuria. Musculoskeletal: Negative for back pain. Skin: Negative for rash. Neurological: Positive for headache.  ____________________________________________   PHYSICAL EXAM:  VITAL SIGNS: ED Triage Vitals  Enc Vitals Group     BP 02/12/19 1452 125/75     Pulse Rate  02/12/19 1452 95     Resp 02/12/19 1452 16     Temp 02/12/19 1452 98 F (36.7 C)     Temp Source 02/12/19 1452 Oral     SpO2 02/12/19 1452 97 %     Weight 02/12/19 1448 140 lb (63.5 kg)     Height 02/12/19 1448 5\' 2"  (1.575 m)     Head Circumference --      Peak Flow --      Pain Score 02/12/19 1447 0    Constitutional: Alert and oriented.  Eyes: Conjunctivae are normal.  ENT      Head: Normocephalic and atraumatic.      Nose: No congestion/rhinnorhea.       Mouth/Throat: Mucous membranes are moist.      Neck: No stridor. Hematological/Lymphatic/Immunilogical: No cervical lymphadenopathy. Cardiovascular: Normal rate, regular rhythm.  No murmurs, rubs, or gallops. Respiratory: Normal respiratory effort without tachypnea nor retractions. Breath sounds are clear and equal bilaterally. No wheezes/rales/rhonchi. Gastrointestinal: Soft and non tender. No rebound. No guarding.  Genitourinary: Deferred Musculoskeletal: Normal range of motion in all extremities. No lower extremity edema. Neurologic:  Normal speech and language. No gross focal neurologic deficits are appreciated.  Skin:  Skin is warm, dry and intact. No rash noted. Psychiatric: Mood and affect are normal. Speech and behavior are normal. Patient exhibits appropriate insight and judgment.  ____________________________________________    LABS (pertinent positives/negatives)  None  ____________________________________________   EKG  None  ____________________________________________    RADIOLOGY  None  ____________________________________________   PROCEDURES  Procedures  ____________________________________________   INITIAL IMPRESSION / ASSESSMENT AND PLAN / ED COURSE  Pertinent labs & imaging results that were available during my care of the patient were reviewed by me and considered in my medical decision making (see chart for details).   Patient presented to the emergency department today at her primary care physician's request to get a cold with test.  Patient has had some symptoms of a cough, chest pressure, diarrhea and headache.  On exam patient no acute distress.  She has not had any direct contact with any positive station but states that she has been contact with somebody who themselves have been in contact with something coping.  I did discuss with patient.  Per her current testing and policy she does not meet criteria.  Additionally she certainly does not  appear to be in any distress.  She does not require admission.  Discussed with patient return precautions.  Did discuss self quarantine.  ____________________________________________   FINAL CLINICAL IMPRESSION(S) / ED DIAGNOSES  Final diagnoses:  Cough  Diarrhea, unspecified type  Nonintractable headache, unspecified chronicity pattern, unspecified headache type     Note: This dictation was prepared with Dragon dictation. Any transcriptional errors that result from this process are unintentional     Phineas SemenGoodman, Jozi Malachi, MD 02/12/19 1512

## 2019-02-12 NOTE — Telephone Encounter (Signed)
We can try again to reach her on Monday

## 2019-02-12 NOTE — Telephone Encounter (Signed)
Called Pt No answer No VM. Will try back later 

## 2019-02-12 NOTE — ED Triage Notes (Signed)
Says cough and fatigue 2 weeks.  Says she has been around someone who has been exposed to covid and her [facility] sent her here for testing.

## 2019-02-16 NOTE — Telephone Encounter (Signed)
Called Pt No answer No VM. Will try back later 

## 2019-05-27 DIAGNOSIS — H66003 Acute suppurative otitis media without spontaneous rupture of ear drum, bilateral: Secondary | ICD-10-CM | POA: Diagnosis not present

## 2019-05-27 DIAGNOSIS — J019 Acute sinusitis, unspecified: Secondary | ICD-10-CM | POA: Diagnosis not present

## 2019-05-27 DIAGNOSIS — B9689 Other specified bacterial agents as the cause of diseases classified elsewhere: Secondary | ICD-10-CM | POA: Diagnosis not present

## 2019-06-19 DIAGNOSIS — N3000 Acute cystitis without hematuria: Secondary | ICD-10-CM | POA: Diagnosis not present

## 2019-06-19 DIAGNOSIS — N23 Unspecified renal colic: Secondary | ICD-10-CM | POA: Diagnosis not present

## 2019-06-19 DIAGNOSIS — R300 Dysuria: Secondary | ICD-10-CM | POA: Diagnosis not present

## 2019-06-20 DIAGNOSIS — R300 Dysuria: Secondary | ICD-10-CM | POA: Diagnosis not present

## 2019-07-10 DIAGNOSIS — N200 Calculus of kidney: Secondary | ICD-10-CM | POA: Diagnosis not present

## 2019-07-10 DIAGNOSIS — R300 Dysuria: Secondary | ICD-10-CM | POA: Diagnosis not present

## 2019-07-10 DIAGNOSIS — M545 Low back pain: Secondary | ICD-10-CM | POA: Diagnosis not present

## 2019-07-14 ENCOUNTER — Emergency Department
Admission: EM | Admit: 2019-07-14 | Discharge: 2019-07-14 | Disposition: A | Discharge status: Home / Self Care | Payer: BC Managed Care – PPO | Attending: Emergency Medicine | Admitting: Emergency Medicine

## 2019-07-14 ENCOUNTER — Encounter: Payer: Self-pay | Admitting: Emergency Medicine

## 2019-07-14 ENCOUNTER — Other Ambulatory Visit: Payer: Self-pay

## 2019-07-14 ENCOUNTER — Emergency Department: Payer: BC Managed Care – PPO

## 2019-07-14 DIAGNOSIS — R109 Unspecified abdominal pain: Secondary | ICD-10-CM | POA: Diagnosis not present

## 2019-07-14 DIAGNOSIS — N83201 Unspecified ovarian cyst, right side: Secondary | ICD-10-CM | POA: Insufficient documentation

## 2019-07-14 DIAGNOSIS — F17290 Nicotine dependence, other tobacco product, uncomplicated: Secondary | ICD-10-CM | POA: Insufficient documentation

## 2019-07-14 HISTORY — DX: Calculus of kidney: N20.0

## 2019-07-14 LAB — URINALYSIS, COMPLETE (UACMP) WITH MICROSCOPIC
Bacteria, UA: NONE SEEN
Bilirubin Urine: NEGATIVE
Glucose, UA: NEGATIVE mg/dL
Hgb urine dipstick: NEGATIVE
Ketones, ur: NEGATIVE mg/dL
Leukocytes,Ua: NEGATIVE
Nitrite: NEGATIVE
Protein, ur: NEGATIVE mg/dL
Specific Gravity, Urine: 1.026 (ref 1.005–1.030)
pH: 7 (ref 5.0–8.0)

## 2019-07-14 LAB — CBC
HCT: 41.3 % (ref 36.0–46.0)
Hemoglobin: 13.7 g/dL (ref 12.0–15.0)
MCH: 28.5 pg (ref 26.0–34.0)
MCHC: 33.2 g/dL (ref 30.0–36.0)
MCV: 85.9 fL (ref 80.0–100.0)
Platelets: 275 10*3/uL (ref 150–400)
RBC: 4.81 MIL/uL (ref 3.87–5.11)
RDW: 15.3 % (ref 11.5–15.5)
WBC: 8.5 10*3/uL (ref 4.0–10.5)
nRBC: 0 % (ref 0.0–0.2)

## 2019-07-14 LAB — COMPREHENSIVE METABOLIC PANEL
ALT: 11 U/L (ref 0–44)
AST: 15 U/L (ref 15–41)
Albumin: 4.4 g/dL (ref 3.5–5.0)
Alkaline Phosphatase: 44 U/L (ref 38–126)
Anion gap: 8 (ref 5–15)
BUN: 7 mg/dL (ref 6–20)
CO2: 25 mmol/L (ref 22–32)
Calcium: 9.3 mg/dL (ref 8.9–10.3)
Chloride: 107 mmol/L (ref 98–111)
Creatinine, Ser: 0.68 mg/dL (ref 0.44–1.00)
GFR calc Af Amer: >60 mL/min (ref >60)
GFR calc non Af Amer: >60 mL/min (ref >60)
Glucose, Bld: 100 mg/dL — ABNORMAL HIGH (ref 70–99)
Potassium: 4 mmol/L (ref 3.5–5.1)
Sodium: 140 mmol/L (ref 135–145)
Total Bilirubin: 0.6 mg/dL (ref 0.3–1.2)
Total Protein: 7.2 g/dL (ref 6.5–8.1)

## 2019-07-14 LAB — POCT PREGNANCY, URINE: Preg Test, Ur: NEGATIVE

## 2019-07-14 LAB — LIPASE, BLOOD: Lipase: 33 U/L (ref 11–51)

## 2019-07-14 MED ORDER — SODIUM CHLORIDE 0.9% FLUSH: 3.0000 mL | Freq: Once | INTRAVENOUS | Status: DC

## 2019-07-14 MED ORDER — MORPHINE SULFATE (PF) 2 MG/ML IV SOLN: 2.0000 mg | INTRAVENOUS | Status: DC | PRN

## 2019-07-14 MED ORDER — NAPROXEN 500 MG PO TABS: 500.0000 mg | ORAL_TABLET | Freq: Two times a day (BID) | ORAL | 2 refills | Status: DC

## 2019-07-14 NOTE — ED Provider Notes (Signed)
[facility] Emergency Department Provider Note   ____________________________________________    I have reviewed the triage vital signs and the nursing notes.   HISTORY  Chief Complaint Flank Pain     HPI Connie Fernandez is a [age] y.o. female who presents with complaints of right-sided flank pain.  Patient describes flank pain started over 2 weeks ago, saw her PCP who felt that she was having a kidney stone.  Started her on some medications which she reports helped only minimally.  Pain was severe again this past Friday, saw her PCP who apparently performed an x-ray and said that if not better by Monday she should go to the hospital.  Patient describes continued mild to moderate intermittent cramping in the right flank.  No hematuria.  No dysuria.  No fevers or chills.  Reportedly her stone is 1 cm on x-ray  Past Medical History:  Diagnosis Date  . Anemia   . Anxiety   . Depression   . Dysmenorrhea in adolescent   . Endometriosis   . Headache    Migraines  . Heart palpitations   . Kidney stone   . Shortness of breath dyspnea    with exertion  . UTI (lower urinary tract infection)     Patient Active Problem List   Diagnosis Date Noted  . Other fatigue 10/13/2018  . Acute cystitis with hematuria 07/25/2017  . History of drug use 07/25/2017  . Acne 02/19/2017  . Dysmenorrhea 02/19/2017  . Chronic pelvic pain in female 07/16/2016  . Tobacco use disorder 10/28/2015  . Panic disorder 10/28/2015  . Major depressive disorder, recurrent episode (HCC) 10/28/2015    Past Surgical History:  Procedure Laterality Date  . LAPAROSCOPY N/A 06/12/2016   Procedure: LAPAROSCOPY DIAGNOSTIC;  Surgeon: Allie BossierMyra C Dove, MD;  Location: [facility];  Service: Gynecology;  Laterality: N/A;    Prior to Admission medications   Medication Sig Start Date End Date Taking? Authorizing Provider  naproxen (NAPROSYN) 500 MG tablet Take 1 tablet (500 mg total) by mouth 2 (two)  times daily with a meal. 07/14/19   Jene EveryKinner, Laurens Matheny, MD  ondansetron (ZOFRAN-ODT) 4 MG disintegrating tablet Take 1 tablet (4 mg total) by mouth every 8 (eight) hours as needed for nausea or vomiting. 12/27/18   Cuthriell, Delorise RoyalsJonathan D, PA-C  traZODone (DESYREL) 50 MG tablet Take 0.5-1 tablets (25-50 mg total) by mouth at bedtime as needed for sleep. 10/13/18   Allegra GranaArnett, Margaret G, FNP     Allergies Latex and Penicillins  Family History  Problem Relation Age of Onset  . Diabetes Mother   . Alcohol abuse Father   . Hypertension Father   . COPD Maternal Grandmother   . Early death Maternal Grandmother [id number]  . Heart disease Maternal Grandmother   . Heart disease Maternal Grandfather 72  . Parkinsonism Paternal Grandmother     Social History Social History   Tobacco Use  . Smoking status: Current Every Day Smoker    Packs/day: 0.00    Years: 1.00    Pack years: 0.00    Types: E-cigarettes  . Smokeless tobacco: Never Used  Substance Use Topics  . Alcohol use: No    Alcohol/week: 0.0 standard drinks  . Drug use: No    Review of Systems  Constitutional: No fever/chills Eyes: No visual changes.  ENT: No sore throat. Cardiovascular: Denies chest pain. Respiratory: Denies shortness of breath. Gastrointestinal: As above Genitourinary: As above Musculoskeletal: Negative for back pain. Skin: Negative for  rash. Neurological: Negative for headaches or weakness   ____________________________________________   PHYSICAL EXAM:  VITAL SIGNS: ED Triage Vitals  Enc Vitals Group     BP 07/14/19 1015 121/73     Pulse Rate 07/14/19 1015 87     Resp 07/14/19 1015 18     Temp 07/14/19 1015 98.6 F (37 C)     Temp Source 07/14/19 1015 Oral     SpO2 07/14/19 1015 99 %     Weight --      Height --      Head Circumference --      Peak Flow --      Pain Score 07/14/19 0931 5     Pain Loc --      Pain Edu? --      Excl. in [place]? --     Constitutional: Alert and oriented.  Eyes:  Conjunctivae are normal.   Nose: No congestion/rhinnorhea. Mouth/Throat: Mucous membranes are moist.    Cardiovascular: Normal rate, regular rhythm. Grossly normal heart sounds.  Good peripheral circulation. Respiratory: Normal respiratory effort.  No retractions. Lungs CTAB. Gastrointestinal: Soft and nontender. No distention.  No CVA tenderness.  Musculoskeletal: No lower extremity tenderness nor edema.  Warm and well perfused Neurologic:  Normal speech and language. No gross focal neurologic deficits are appreciated.  Skin:  Skin is warm, dry and intact. No rash noted. Psychiatric: Mood and affect are normal. Speech and behavior are normal.  ____________________________________________   LABS (all labs ordered are listed, but only abnormal results are displayed)  Labs Reviewed  COMPREHENSIVE METABOLIC PANEL - Abnormal; Notable for the following components:      Result Value   Glucose, Bld 100 (*)    All other components within normal limits  URINALYSIS, COMPLETE (UACMP) WITH MICROSCOPIC - Abnormal; Notable for the following components:   Color, Urine YELLOW (*)    APPearance CLOUDY (*)    All other components within normal limits  LIPASE, BLOOD  CBC  POC URINE PREG, ED  POCT PREGNANCY, URINE   ____________________________________________  EKG  None ____________________________________________  RADIOLOGY  None ____________________________________________   PROCEDURES  Procedure(s) performed: No  Procedures   Critical Care performed: No ____________________________________________   INITIAL IMPRESSION / ASSESSMENT AND PLAN / ED COURSE  Pertinent labs & imaging results that were available during my care of the patient were reviewed by me and considered in my medical decision making (see chart for details).  Patient presents with flank pain as described above.  Overall she is well-appearing and in no acute distress, certainly her symptoms could be  consistent with a kidney stone.  Will obtain CT renal stone study, obtain urine, labs and discuss with urology.  CT scan negative for kidney stone.  Discussed this with patient and the need for outpatient follow-up, she does have a small ovarian cyst, will follow-up with gynecology, will trial NSAIDs    ____________________________________________   FINAL CLINICAL IMPRESSION(S) / ED DIAGNOSES  Final diagnoses:  Cyst of right ovary        Note:  This document was prepared using Dragon voice recognition software and may include unintentional dictation errors.   Lavonia Drafts, MD 07/14/19 1500

## 2019-07-14 NOTE — ED Triage Notes (Signed)
Says she has kidney stones and was told if she did not pass by now she needs to be admitted.

## 2019-07-14 NOTE — ED Notes (Signed)
EDP to bedside. 

## 2019-07-16 DIAGNOSIS — L309 Dermatitis, unspecified: Secondary | ICD-10-CM | POA: Diagnosis not present

## 2019-08-03 DIAGNOSIS — M795 Residual foreign body in soft tissue: Secondary | ICD-10-CM | POA: Diagnosis not present

## 2019-08-06 ENCOUNTER — Other Ambulatory Visit: Payer: Self-pay

## 2019-08-06 DIAGNOSIS — Z20822 Contact with and (suspected) exposure to covid-19: Secondary | ICD-10-CM

## 2019-08-06 DIAGNOSIS — Z20828 Contact with and (suspected) exposure to other viral communicable diseases: Secondary | ICD-10-CM | POA: Diagnosis not present

## 2019-08-07 LAB — NOVEL CORONAVIRUS, NAA: SARS-CoV-2, NAA: NOT DETECTED

## 2019-08-13 ENCOUNTER — Encounter: Payer: Self-pay | Admitting: Obstetrics & Gynecology

## 2019-08-13 ENCOUNTER — Other Ambulatory Visit: Payer: Self-pay

## 2019-08-13 ENCOUNTER — Ambulatory Visit (INDEPENDENT_AMBULATORY_CARE_PROVIDER_SITE_OTHER): Payer: BC Managed Care – PPO | Admitting: Obstetrics & Gynecology

## 2019-08-13 VITALS — BP 117/70 | HR 85 | Ht 62.0 in | Wt 145.6 lb

## 2019-08-13 DIAGNOSIS — Z113 Encounter for screening for infections with a predominantly sexual mode of transmission: Secondary | ICD-10-CM

## 2019-08-13 DIAGNOSIS — Z01419 Encounter for gynecological examination (general) (routine) without abnormal findings: Secondary | ICD-10-CM

## 2019-08-13 DIAGNOSIS — Z124 Encounter for screening for malignant neoplasm of cervix: Secondary | ICD-10-CM

## 2019-08-13 DIAGNOSIS — N946 Dysmenorrhea, unspecified: Secondary | ICD-10-CM

## 2019-08-13 DIAGNOSIS — G8929 Other chronic pain: Secondary | ICD-10-CM

## 2019-08-13 DIAGNOSIS — R102 Pelvic and perineal pain: Secondary | ICD-10-CM

## 2019-08-13 MED ORDER — TAYTULLA 1-20 MG-MCG(24) PO CAPS: 1.0000 | ORAL_CAPSULE | Freq: Every day | ORAL | 3 refills | Status: DC

## 2019-08-13 MED ORDER — NAPROXEN 500 MG PO TABS: 500.0000 mg | ORAL_TABLET | Freq: Two times a day (BID) | ORAL | 2 refills | Status: DC

## 2019-08-13 NOTE — Progress Notes (Signed)
GYNECOLOGY ANNUAL PREVENTATIVE CARE ENCOUNTER NOTE  History:     Connie Fernandez is a 23 y.o. G0 female here for a routine annual gynecologic exam.  Current complaints: recurrent dysmenorrhea/pelvic pain. Negative laparoscopy in 2017. Recently had kidney stones, also incidental small physiologic ovarian cyst seen.   Denies abnormal vaginal bleeding, discharge, problems with intercourse or other gynecologic concerns.    Gynecologic History Patient's last menstrual period was 07/26/2019 (exact date). Contraception: condoms Last Pap: 2016. Results were: normal   Obstetric History OB History  No obstetric history on file.    Past Medical History:  Diagnosis Date  . Anemia   . Anxiety   . Depression   . Dysmenorrhea in adolescent   . Endometriosis   . Headache    Migraines  . Heart palpitations   . Kidney stone   . Shortness of breath dyspnea    with exertion  . UTI (lower urinary tract infection)     Past Surgical History:  Procedure Laterality Date  . LAPAROSCOPY N/A 06/12/2016   Procedure: LAPAROSCOPY DIAGNOSTIC;  Surgeon: Allie Bossier, MD;  Location: [facility];  Service: Gynecology;  Laterality: N/A;    Current Outpatient Medications on File Prior to Visit  Medication Sig Dispense Refill  . ondansetron (ZOFRAN-ODT) 4 MG disintegrating tablet Take 1 tablet (4 mg total) by mouth every 8 (eight) hours as needed for nausea or vomiting. (Patient not taking: Reported on 08/13/2019) 20 tablet 0  . traZODone (DESYREL) 50 MG tablet Take 0.5-1 tablets (25-50 mg total) by mouth at bedtime as needed for sleep. (Patient not taking: Reported on 08/13/2019) 90 tablet 1   No current facility-administered medications on file prior to visit.     Allergies  Allergen Reactions  . Latex Itching  . Penicillins Hives    Has patient had a PCN reaction causing immediate rash, facial/tongue/throat swelling, SOB or lightheadedness with hypotension: Dizziness and shaky. Has patient had a PCN  reaction causing severe rash involving mucus membranes or skin necrosis: NO Has patient had a PCN reaction that required hospitalization: NO Has patient had a PCN reaction occurring within the last 10 years: NO If all of the above answers are "NO", then may proceed with Cephalosporin use.     Social History:  reports that she has been smoking e-cigarettes. She has been smoking about 0.00 packs per day for the past 1.00 year. She has never used smokeless tobacco. She reports that she does not drink alcohol or use drugs.  Family History  Problem Relation Age of Onset  . Diabetes Mother   . Alcohol abuse Father   . Hypertension Father   . COPD Maternal Grandmother   . Early death Maternal Grandmother 90  . Heart disease Maternal Grandmother   . Heart disease Maternal Grandfather 72  . Parkinsonism Paternal Grandmother     The following portions of the patient's history were reviewed and updated as appropriate: allergies, current medications, past family history, past medical history, past social history, past surgical history and problem list.  Review of Systems Pertinent items noted in HPI and remainder of comprehensive ROS otherwise negative.  Physical Exam:  BP 117/70   Pulse 85   Ht 5\' 2"  (1.575 m)   Wt 145 lb 9.6 oz (66 kg)   LMP 07/26/2019 (Exact Date)   BMI 26.63 kg/m  CONSTITUTIONAL: Well-developed, well-nourished female in no acute distress.  HENT:  Normocephalic, atraumatic, External right and left ear normal. Oropharynx is clear and moist EYES:  Conjunctivae and EOM are normal. Pupils are equal, round, and reactive to light. No scleral icterus.  NECK: Normal range of motion, supple, no masses.  Normal thyroid.  SKIN: Skin is warm and dry. No rash noted. Not diaphoretic. No erythema. No pallor. MUSCULOSKELETAL: Normal range of motion. No tenderness.  No cyanosis, clubbing, or edema.  2+ distal pulses. NEUROLOGIC: Alert and oriented to person, place, and time. Normal  reflexes, muscle tone coordination. No cranial nerve deficit noted. PSYCHIATRIC: Normal mood and affect. Normal behavior. Normal judgment and thought content. CARDIOVASCULAR: Normal heart rate noted, regular rhythm RESPIRATORY: Clear to auscultation bilaterally. Effort and breath sounds normal, no problems with respiration noted. BREASTS: Symmetric in size. No masses, skin changes, nipple drainage, or lymphadenopathy. ABDOMEN: Soft, normal bowel sounds, no distention noted.  No tenderness, rebound or guarding.  PELVIC: Normal appearing external genitalia; normal appearing vaginal mucosa and cervix.  No abnormal discharge noted.  Pap smear obtained.  Normal uterine size, no other palpable masses, no uterine or adnexal tenderness.   Assessment and Plan:    1. Dysmenorrhea 2. Chronic pelvic pain in female Naproxen and Taytulla will be tried for pain, will see response. Follow up in 2 months for OCP and BP check.  - naproxen (NAPROSYN) 500 MG tablet; Take 1 tablet (500 mg total) by mouth 2 (two) times daily with a meal. Take as needed for pain  Dispense: 30 tablet; Refill: 2 - TAYTULLA 1-20 MG-MCG(24) CAPS; Take 1 tablet by mouth daily.  Dispense: 84 capsule; Refill: 3  3. Well woman exam with routine gynecological exam - HIV Antibody (routine testing w rflx) - Hepatitis C antibody - Hepatitis B surface antigen - RPR - Cytology - PAP Will follow up results of pap smear and results and manage accordingly. Routine preventative health maintenance measures emphasized. Please refer to After Visit Summary for other counseling recommendations.      Verita Schneiders, MD, [place] [facility] [employer], [place]

## 2019-08-13 NOTE — Patient Instructions (Signed)
Preventive Care 21-23 Years Old, Female Preventive care refers to visits with your health care provider and lifestyle choices that can promote health and wellness. This includes:  A yearly physical exam. This may also be called an annual well check.  Regular dental visits and eye exams.  Immunizations.  Screening for certain conditions.  Healthy lifestyle choices, such as eating a healthy diet, getting regular exercise, not using drugs or products that contain nicotine and tobacco, and limiting alcohol use. What can I expect for my preventive care visit? Physical exam Your health care provider will check your:  Height and weight. This may be used to calculate body mass index (BMI), which tells if you are at a healthy weight.  Heart rate and blood pressure.  Skin for abnormal spots. Counseling Your health care provider may ask you questions about your:  Alcohol, tobacco, and drug use.  Emotional well-being.  Home and relationship well-being.  Sexual activity.  Eating habits.  Work and work environment.  Method of birth control.  Menstrual cycle.  Pregnancy history. What immunizations do I need?  Influenza (flu) vaccine  This is recommended every year. Tetanus, diphtheria, and pertussis (Tdap) vaccine  You may need a Td booster every 10 years. Varicella (chickenpox) vaccine  You may need this if you have not been vaccinated. Human papillomavirus (HPV) vaccine  If recommended by your health care provider, you may need three doses over 6 months. Measles, mumps, and rubella (MMR) vaccine  You may need at least one dose of MMR. You may also need a second dose. Meningococcal conjugate (MenACWY) vaccine  One dose is recommended if you are age 19-21 years and a first-year college student living in a residence hall, or if you have one of several medical conditions. You may also need additional booster doses. Pneumococcal conjugate (PCV13) vaccine  You may need  this if you have certain conditions and were not previously vaccinated. Pneumococcal polysaccharide (PPSV23) vaccine  You may need one or two doses if you smoke cigarettes or if you have certain conditions. Hepatitis A vaccine  You may need this if you have certain conditions or if you travel or work in places where you may be exposed to hepatitis A. Hepatitis B vaccine  You may need this if you have certain conditions or if you travel or work in places where you may be exposed to hepatitis B. Haemophilus influenzae type b (Hib) vaccine  You may need this if you have certain conditions. You may receive vaccines as individual doses or as more than one vaccine together in one shot (combination vaccines). Talk with your health care provider about the risks and benefits of combination vaccines. What tests do I need?  Blood tests  Lipid and cholesterol levels. These may be checked every 5 years starting at age 20.  Hepatitis C test.  Hepatitis B test. Screening  Diabetes screening. This is done by checking your blood sugar (glucose) after you have not eaten for a while (fasting).  Sexually transmitted disease (STD) testing.  BRCA-related cancer screening. This may be done if you have a family history of breast, ovarian, tubal, or peritoneal cancers.  Pelvic exam and Pap test. This may be done every 3 years starting at age 21. Starting at age 30, this may be done every 5 years if you have a Pap test in combination with an HPV test. Talk with your health care provider about your test results, treatment options, and if necessary, the need for more tests.   Follow these instructions at home: Eating and drinking   Eat a diet that includes fresh fruits and vegetables, whole grains, lean protein, and low-fat dairy.  Take vitamin and mineral supplements as recommended by your health care provider.  Do not drink alcohol if: ? Your health care provider tells you not to drink. ? You are  pregnant, may be pregnant, or are planning to become pregnant.  If you drink alcohol: ? Limit how much you have to 0-1 drink a day. ? Be aware of how much alcohol is in your drink. In the [place] one drink equals one 12 oz bottle of beer (355 mL), one 5 oz glass of wine (148 mL), or one 1 oz glass of hard liquor (44 mL). Lifestyle  Take daily care of your teeth and gums.  Stay active. Exercise for at least 30 minutes on 5 or more days each week.  Do not use any products that contain nicotine or tobacco, such as cigarettes, e-cigarettes, and chewing tobacco. If you need help quitting, ask your health care provider.  If you are sexually active, practice safe sex. Use a condom or other form of birth control (contraception) in order to prevent pregnancy and STIs (sexually transmitted infections). If you plan to become pregnant, see your health care provider for a preconception visit. What's next?  Visit your health care provider once a year for a well check visit.  Ask your health care provider how often you should have your eyes and teeth checked.  Stay up to date on all vaccines. This information is not intended to replace advice given to you by your health care provider. Make sure you discuss any questions you have with your health care provider. Document Released: 12/18/2001 Document Revised: 07/03/2018 Document Reviewed: 07/03/2018 Elsevier Patient Education  2020 [employer]  

## 2019-08-14 LAB — HEPATITIS C ANTIBODY: Hep C Virus Ab: <0.1 s/co ratio (ref 0.0–0.9)

## 2019-08-14 LAB — HIV ANTIBODY (ROUTINE TESTING W REFLEX): HIV Screen 4th Generation wRfx: NONREACTIVE

## 2019-08-14 LAB — HEPATITIS B SURFACE ANTIGEN: Hepatitis B Surface Ag: NEGATIVE

## 2019-08-14 LAB — RPR: RPR Ser Ql: NONREACTIVE

## 2019-08-24 LAB — CYTOLOGY - PAP
Chlamydia: NEGATIVE
Comment: NEGATIVE
Comment: NEGATIVE
Comment: NORMAL
Diagnosis: NEGATIVE
Neisseria Gonorrhea: NEGATIVE
Trichomonas: NEGATIVE

## 2019-08-26 ENCOUNTER — Telehealth: Payer: Self-pay | Admitting: Family

## 2019-08-26 NOTE — Telephone Encounter (Signed)
Pt aware covid lab test negative, not detected °

## 2019-09-08 ENCOUNTER — Telehealth: Payer: Self-pay

## 2019-09-08 DIAGNOSIS — F331 Major depressive disorder, recurrent, moderate: Secondary | ICD-10-CM

## 2019-09-08 NOTE — Telephone Encounter (Addendum)
Called and spoke with patient.  Patient said she doesn't need to be referred to a new therapist, but needs a new referral.  Patient said that she is having anxiety and depression that started in June of this year. Patient said that she previously saw [name], a therapist at [place] and was last seen there about 2 1/2 years ago.  Pt is ok with a referral back to her previous counselor at [facility].  Patient asked PHQ-9 questions and GAD-7 questions while on the phone.  PHQ-9 Score:  13  GAD-7 Score 15  Patient did admit to having thoughts that she would be better off dead or of hurting herself in someway.  Patient denied having a plan to hurt herself.  Offered number to [employer] for mental health to patient as a resource if she needs to talk to someone before referral is done but patient declined.

## 2019-09-08 NOTE — Telephone Encounter (Signed)
Copied from [place] [id number]. Topic: General - Inquiry >> Sep 08, 2019 10:49 AM Mathis Bud wrote: Reason for CRM: patient states requesting a new therapist.  Patient does not want to wait until a month out. Patient desperately needs to speak with someone.  She would like to speak with someone in the next 2 weeks.  Call back [phone]

## 2019-09-08 NOTE — Telephone Encounter (Signed)
I need to know who her previous therapist is so we do not send her to the same one.  There is no record in the chart.  Please Forward the information directly to [facility] tuck.   Also given the tone of your message,  Did you screen her for suicidality?  If not you need to have [facility] call her

## 2019-09-08 NOTE — Telephone Encounter (Signed)
Patient said that she saw a counselor by the name of [name] at [facility] about 2 1/2 years ago and is ok with returning to see her again.

## 2019-09-17 ENCOUNTER — Ambulatory Visit: Payer: BC Managed Care – PPO | Admitting: Obstetrics & Gynecology

## 2019-09-17 NOTE — Progress Notes (Deleted)
   Patient did not show up today for her scheduled appointment.   UGONNA  ANYANWU, MD, FACOG Obstetrician & Gynecologist, [facility], [facility]  

## 2019-11-30 ENCOUNTER — Ambulatory Visit: Payer: BC Managed Care – PPO | Admitting: Internal Medicine

## 2019-11-30 ENCOUNTER — Ambulatory Visit (INDEPENDENT_AMBULATORY_CARE_PROVIDER_SITE_OTHER): Payer: BC Managed Care – PPO | Admitting: Psychology

## 2019-11-30 DIAGNOSIS — F332 Major depressive disorder, recurrent severe without psychotic features: Secondary | ICD-10-CM | POA: Diagnosis not present

## 2019-12-10 ENCOUNTER — Ambulatory Visit (INDEPENDENT_AMBULATORY_CARE_PROVIDER_SITE_OTHER): Payer: BC Managed Care – PPO | Admitting: Psychology

## 2019-12-10 DIAGNOSIS — F332 Major depressive disorder, recurrent severe without psychotic features: Secondary | ICD-10-CM | POA: Diagnosis not present

## 2019-12-14 ENCOUNTER — Encounter: Payer: Self-pay | Admitting: Family

## 2019-12-15 ENCOUNTER — Other Ambulatory Visit: Payer: Self-pay

## 2019-12-15 ENCOUNTER — Encounter: Payer: Self-pay | Admitting: Family

## 2019-12-15 ENCOUNTER — Ambulatory Visit (INDEPENDENT_AMBULATORY_CARE_PROVIDER_SITE_OTHER): Payer: BC Managed Care – PPO | Admitting: Family

## 2019-12-15 VITALS — Ht 62.0 in | Wt 143.0 lb

## 2019-12-15 DIAGNOSIS — R739 Hyperglycemia, unspecified: Secondary | ICD-10-CM | POA: Diagnosis not present

## 2019-12-15 DIAGNOSIS — F331 Major depressive disorder, recurrent, moderate: Secondary | ICD-10-CM | POA: Diagnosis not present

## 2019-12-15 MED ORDER — TRAZODONE HCL 50 MG PO TABS: 25.0000 mg | ORAL_TABLET | Freq: Every evening | ORAL | 3 refills | Status: DC | PRN

## 2019-12-15 NOTE — Assessment & Plan Note (Signed)
I had a long discussion with patient in regards to her meals.  Advised her to eat small more frequent meals more often.  Advised to carpet the protein and given her examples of this.  Also advised that she was not medically hypoglycemic as her blood sugars are not less than 70 although she had symptoms thereof.  Advised her to keep protein bars or shakes with her if she did not have access to quick food.  We will also look at an A1c to look at her average of blood glucose.  She quite declines nutritionist at this time.

## 2019-12-15 NOTE — Assessment & Plan Note (Addendum)
Worsening. Concern for manic symptoms, h/o substance abuse, suicide attempt .  Patient would not explain detail of suicide attempt 2 months ago as her son was in her present on video today. She denies any thoughst of hurting herself or suicide plan at this time. She confides and feels supported by her mother present in video today. She states that she will seek medical attention if she ever has thoughts of hurting herself or anyone else.  Advised to stop supplement Kratom as unaware of this medication, safety profile, start trazodone to help with insomnia, depression. Urgent referral back to Dr Garnetta Buddy. She will continue counseling.  Close follow up

## 2019-12-15 NOTE — Patient Instructions (Addendum)
Stop Kratom Start trazodone.   Call the office to schedule labs and follow up

## 2019-12-15 NOTE — Progress Notes (Signed)
Virtual Visit via Video Note  I connected with@  on 12/16/19 at 10:00 AM EST by a video enabled telemedicine application and verified that I am speaking with the correct person using two identifiers.  Location patient: home Location provider:work  Persons participating in the virtual visit: patient, provider  I discussed the limitations of evaluation and management by telemedicine and the availability of in person appointments. The patient expressed understanding and agreed to proceed.   HPI: Follow up Accompanied by mother today  complains elevated blood glucose over the past month.  Describes episodes of  'low sugar'  Occurring in mid afternoons. Would test sugar at that time and would range from Lowest 70 to 120. Felt dizzy with nausea at that time, resolved with eating . Notes not eating breakfast, would eat lunch 2-3pm and dinner 8pm. Had coffee in the morning, 2-3 cups . Drinking water. Urine is pale. No LOC, syncope, CP.   2 hours post prandial blood glucose 168, 142  FGB 203 ( only FBG she took)  Would like refill of naprosyn for menstrual cramps.   Depression and anxiety- Some worse. No current SI/ HI. Didn't care for lexapro, zoloft.  Attempt at SI 2 months ago.  No plan to harm herself at this time nor anyone else.  She confides in her mother and counselor.  She feels her mother is supportive and she confides in her.  Broke up with long term partner 05/2019. Using Kratom for anxiety and sleep which has been working well for past couple of days. Had been drinking alcohol daily a few months ago, blacking out, now only drinking 2-3 times per month and is not drinking to the point of blacking out, vomiting.  Endorses having periods of having more energy than usual which, didn't require sleep, spending more money than usual and got into trouble.  Following with therapist which is helpful.   ROS: See pertinent positives and negatives per HPI.  Past Medical History:  Diagnosis Date   . Anemia   . Anxiety   . Depression   . Dysmenorrhea in adolescent   . Endometriosis   . Headache    Migraines  . Heart palpitations   . Kidney stone   . Shortness of breath dyspnea    with exertion  . UTI (lower urinary tract infection)     Past Surgical History:  Procedure Laterality Date  . LAPAROSCOPY N/A 06/12/2016   Procedure: LAPAROSCOPY DIAGNOSTIC;  Surgeon: Allie Bossier, MD;  Location: [facility];  Service: Gynecology;  Laterality: N/A;    Family History  Problem Relation Age of Onset  . Diabetes Mother   . Alcohol abuse Father   . Hypertension Father   . COPD Maternal Grandmother   . Early death Maternal Grandmother 73  . Heart disease Maternal Grandmother   . Heart disease Maternal Grandfather 72  . Parkinsonism Paternal Grandmother     SOCIAL HX: current smoker ( vape). No drug use.    Current Outpatient Medications:  .  naproxen (NAPROSYN) 500 MG tablet, Take 1 tablet (500 mg total) by mouth 2 (two) times daily with a meal. Take as needed for pain (Patient not taking: Reported on 12/15/2019), Disp: 30 tablet, Rfl: 2 .  traZODone (DESYREL) 50 MG tablet, Take 0.5-1 tablets (25-50 mg total) by mouth at bedtime as needed for sleep., Disp: 30 tablet, Rfl: 3  EXAM:  VITALS per patient if applicable: BP Readings from Last 3 Encounters:  08/13/19 117/70  07/14/19 113/63  02/12/19 125/75     GENERAL: alert, oriented, appears well and in no acute distress  HEENT: atraumatic, conjunttiva clear, no obvious abnormalities on inspection of external nose and ears  NECK: normal movements of the head and neck  LUNGS: on inspection no signs of respiratory distress, breathing rate appears normal, no obvious gross SOB, gasping or wheezing  CV: no obvious cyanosis  MS: moves all visible extremities without noticeable abnormality  PSYCH/NEURO: pleasant and cooperative, no obvious depression or anxiety, speech and thought processing grossly intact  ASSESSMENT AND  PLAN:  Discussed the following assessment and plan:  Moderate episode of recurrent major depressive disorder ([place]) - Plan: CBC with Differential/Platelet, traZODone (DESYREL) 50 MG tablet, Ambulatory referral to Psychiatry  Hyperglycemia - Plan: CBC with Differential/Platelet, Comprehensive metabolic panel, Hemoglobin A1c, Lipid panel, VITAMIN D 25 Hydroxy (Vit-D Deficiency, Fractures), TSH Problem List Items Addressed This Visit      Other   Hyperglycemia    I had a long discussion with patient in regards to her meals.  Advised her to eat small more frequent meals more often.  Advised to carpet the protein and given her examples of this.  Also advised that she was not medically hypoglycemic as her blood sugars are not less than 70 although she had symptoms thereof.  Advised her to keep protein bars or shakes with her if she did not have access to quick food.  We will also look at an A1c to look at her average of blood glucose.  She quite declines nutritionist at this time.      Relevant Orders   CBC with Differential/Platelet   Comprehensive metabolic panel   Hemoglobin A1c   Lipid panel   VITAMIN D 25 Hydroxy (Vit-D Deficiency, Fractures)   TSH   Major depressive disorder, recurrent episode ([place]) - Primary    Worsening. Concern for manic symptoms, h/o substance abuse, suicide attempt .  Patient would not explain detail of suicide attempt 2 months ago as her son was in her present on video today. She denies any thoughst of hurting herself or suicide plan at this time. She confides and feels supported by her mother present in video today. She states that she will seek medical attention if she ever has thoughts of hurting herself or anyone else.  Advised to stop supplement Kratom as unaware of this medication, safety profile, start trazodone to help with insomnia, depression. Urgent referral back to Dr Gretel Acre. She will continue counseling.  Close follow up      Relevant Medications    traZODone (DESYREL) 50 MG tablet   Other Relevant Orders   CBC with Differential/Platelet   Ambulatory referral to Psychiatry      -we discussed possible serious and likely etiologies, options for evaluation and workup, limitations of telemedicine visit vs in person visit, treatment, treatment risks and precautions. Pt prefers to treat via telemedicine empirically rather then risking or undertaking an in person visit at this moment. Patient agrees to seek prompt in person care if worsening, new symptoms arise, or if is not improving with treatment.   I discussed the assessment and treatment plan with the patient. The patient was provided an opportunity to ask questions and all were answered. The patient agreed with the plan and demonstrated an understanding of the instructions.   The patient was advised to call back or seek an in-person evaluation if the symptoms worsen or if the condition fails to improve as anticipated.   Mable Paris, FNP

## 2019-12-17 ENCOUNTER — Other Ambulatory Visit: Payer: Self-pay

## 2019-12-17 ENCOUNTER — Ambulatory Visit: Payer: BC Managed Care – PPO | Admitting: Psychology

## 2019-12-17 ENCOUNTER — Other Ambulatory Visit (INDEPENDENT_AMBULATORY_CARE_PROVIDER_SITE_OTHER): Payer: BC Managed Care – PPO

## 2019-12-17 DIAGNOSIS — F331 Major depressive disorder, recurrent, moderate: Secondary | ICD-10-CM

## 2019-12-17 DIAGNOSIS — R739 Hyperglycemia, unspecified: Secondary | ICD-10-CM | POA: Diagnosis not present

## 2019-12-17 LAB — CBC WITH DIFFERENTIAL/PLATELET
Basophils Absolute: 0.1 10*3/uL (ref 0.0–0.1)
Basophils Relative: 1.1 % (ref 0.0–3.0)
Eosinophils Absolute: 0.3 10*3/uL (ref 0.0–0.7)
Eosinophils Relative: 3.9 % (ref 0.0–5.0)
HCT: 40.4 % (ref 36.0–46.0)
Hemoglobin: 13.4 g/dL (ref 12.0–15.0)
Lymphocytes Relative: 36.5 % (ref 12.0–46.0)
Lymphs Abs: 2.7 10*3/uL (ref 0.7–4.0)
MCHC: 33.1 g/dL (ref 30.0–36.0)
MCV: 89.9 fl (ref 78.0–100.0)
Monocytes Absolute: 0.7 10*3/uL (ref 0.1–1.0)
Monocytes Relative: 9.9 % (ref 3.0–12.0)
Neutro Abs: 3.5 10*3/uL (ref 1.4–7.7)
Neutrophils Relative %: 48.6 % (ref 43.0–77.0)
Platelets: 254 10*3/uL (ref 150.0–400.0)
RBC: 4.49 Mil/uL (ref 3.87–5.11)
RDW: 14.1 % (ref 11.5–15.5)
WBC: 7.3 10*3/uL (ref 4.0–10.5)

## 2019-12-17 LAB — COMPREHENSIVE METABOLIC PANEL
ALT: 14 U/L (ref 0–35)
AST: 13 U/L (ref 0–37)
Albumin: 4.3 g/dL (ref 3.5–5.2)
Alkaline Phosphatase: 50 U/L (ref 39–117)
BUN: 11 mg/dL (ref 6–23)
CO2: 29 mEq/L (ref 19–32)
Calcium: 8.9 mg/dL (ref 8.4–10.5)
Chloride: 105 mEq/L (ref 96–112)
Creatinine, Ser: 0.94 mg/dL (ref 0.40–1.20)
GFR: 73.49 mL/min (ref >60.00)
Glucose, Bld: 67 mg/dL — ABNORMAL LOW (ref 70–99)
Potassium: 3.9 mEq/L (ref 3.5–5.1)
Sodium: 140 mEq/L (ref 135–145)
Total Bilirubin: 0.3 mg/dL (ref 0.2–1.2)
Total Protein: 6.9 g/dL (ref 6.0–8.3)

## 2019-12-17 LAB — VITAMIN D 25 HYDROXY (VIT D DEFICIENCY, FRACTURES): VITD: 12.62 ng/mL — ABNORMAL LOW (ref 30.00–100.00)

## 2019-12-17 LAB — LIPID PANEL
Cholesterol: 145 mg/dL (ref 0–200)
HDL: 58.2 mg/dL (ref >39.00)
LDL Cholesterol: 69 mg/dL (ref 0–99)
NonHDL: 86.32
Total CHOL/HDL Ratio: 2
Triglycerides: 87 mg/dL (ref 0.0–149.0)
VLDL: 17.4 mg/dL (ref 0.0–40.0)

## 2019-12-17 LAB — TSH: TSH: 1.5 u[IU]/mL (ref 0.35–4.50)

## 2019-12-17 LAB — HEMOGLOBIN A1C: Hgb A1c MFr Bld: 5.6 % (ref 4.6–6.5)

## 2019-12-17 NOTE — Progress Notes (Signed)
Patient had labs drawn this morning. She does had f/u scheduled with you. Patient picked up trazodone yesterday & has not started. She said that she had no thoughts of hurting herself & had therapy scheduled today at 33.

## 2019-12-19 ENCOUNTER — Encounter: Payer: Self-pay | Admitting: Family

## 2019-12-19 ENCOUNTER — Other Ambulatory Visit: Payer: Self-pay | Admitting: Family

## 2019-12-19 DIAGNOSIS — E559 Vitamin D deficiency, unspecified: Secondary | ICD-10-CM

## 2019-12-19 MED ORDER — CHOLECALCIFEROL 1.25 MG (50000 UT) PO TABS: ORAL_TABLET | ORAL | 0 refills | Status: DC

## 2019-12-24 ENCOUNTER — Ambulatory Visit: Payer: BC Managed Care – PPO | Admitting: Psychology

## 2019-12-31 ENCOUNTER — Ambulatory Visit: Payer: BC Managed Care – PPO | Admitting: Psychology

## 2020-01-07 ENCOUNTER — Ambulatory Visit: Payer: BC Managed Care – PPO | Admitting: Psychology

## 2020-01-11 NOTE — Progress Notes (Deleted)
Virtual Visit via Video Note  I connected with@  on 01/11/20 at  9:00 AM EST by a video enabled telemedicine application and verified that I am speaking with the correct person using two identifiers.  Location patient: home Location provider:work or home office Persons participating in the virtual visit: patient, provider  I discussed the limitations of evaluation and management by telemedicine and the availability of in person appointments. The patient expressed understanding and agreed to proceed.   HPI: Psychiatry appointment?   ROS: See pertinent positives and negatives per HPI.  Past Medical History:  Diagnosis Date  . Anemia   . Anxiety   . Depression   . Dysmenorrhea in adolescent   . Endometriosis   . Headache    Migraines  . Heart palpitations   . Kidney stone   . Shortness of breath dyspnea    with exertion  . UTI (lower urinary tract infection)     Past Surgical History:  Procedure Laterality Date  . LAPAROSCOPY N/A 06/12/2016   Procedure: LAPAROSCOPY DIAGNOSTIC;  Surgeon: Allie Bossier, MD;  Location: [facility];  Service: Gynecology;  Laterality: N/A;    Family History  Problem Relation Age of Onset  . Diabetes Mother   . Alcohol abuse Father   . Hypertension Father   . COPD Maternal Grandmother   . Early death Maternal Grandmother 48  . Heart disease Maternal Grandmother   . Heart disease Maternal Grandfather 72  . Parkinsonism Paternal Grandmother     SOCIAL HX: ***   Current Outpatient Medications:  .  Cholecalciferol 1.25 MG (50000 UT) TABS, 50,000 units PO qwk for 8 weeks., Disp: 8 tablet, Rfl: 0 .  naproxen (NAPROSYN) 500 MG tablet, Take 1 tablet (500 mg total) by mouth 2 (two) times daily with a meal. Take as needed for pain (Patient not taking: Reported on 12/15/2019), Disp: 30 tablet, Rfl: 2 .  traZODone (DESYREL) 50 MG tablet, Take 0.5-1 tablets (25-50 mg total) by mouth at bedtime as needed for sleep., Disp: 30 tablet, Rfl: 3  EXAM:  VITALS  per patient if applicable:  GENERAL: alert, oriented, appears well and in no acute distress  HEENT: atraumatic, conjunttiva clear, no obvious abnormalities on inspection of external nose and ears  NECK: normal movements of the head and neck  LUNGS: on inspection no signs of respiratory distress, breathing rate appears normal, no obvious gross SOB, gasping or wheezing  CV: no obvious cyanosis  MS: moves all visible extremities without noticeable abnormality  PSYCH/NEURO: pleasant and cooperative, no obvious depression or anxiety, speech and thought processing grossly intact  ASSESSMENT AND PLAN:  Discussed the following assessment and plan:  No diagnosis found.  -we discussed possible serious and likely etiologies, options for evaluation and workup, limitations of telemedicine visit vs in person visit, treatment, treatment risks and precautions. Pt prefers to treat via telemedicine empirically rather then risking or undertaking an in person visit at this moment. Patient agrees to seek prompt in person care if worsening, new symptoms arise, or if is not improving with treatment.   I discussed the assessment and treatment plan with the patient. The patient was provided an opportunity to ask questions and all were answered. The patient agreed with the plan and demonstrated an understanding of the instructions.   The patient was advised to call back or seek an in-person evaluation if the symptoms worsen or if the condition fails to improve as anticipated.   Rennie Plowman, FNP

## 2020-01-12 ENCOUNTER — Ambulatory Visit: Payer: BC Managed Care – PPO | Admitting: Family

## 2020-01-12 DIAGNOSIS — Z0289 Encounter for other administrative examinations: Secondary | ICD-10-CM

## 2020-01-14 ENCOUNTER — Ambulatory Visit: Payer: BC Managed Care – PPO | Admitting: Psychology

## 2020-01-15 ENCOUNTER — Telehealth: Payer: Self-pay | Admitting: Family

## 2020-01-15 NOTE — Telephone Encounter (Signed)
Call patient I believe she missed her appoint with me this week.  I wanted to see how she is feeling.  We placed a referral to psychiatry and psychology.  Has she received phone calls in regards to these appointments?

## 2020-01-15 NOTE — Telephone Encounter (Signed)
LM asking patient to call back

## 2020-01-19 ENCOUNTER — Other Ambulatory Visit: Payer: Self-pay

## 2020-01-19 ENCOUNTER — Encounter: Payer: Self-pay | Admitting: Psychiatry

## 2020-01-19 ENCOUNTER — Ambulatory Visit (INDEPENDENT_AMBULATORY_CARE_PROVIDER_SITE_OTHER): Payer: BC Managed Care – PPO | Admitting: Psychiatry

## 2020-01-19 DIAGNOSIS — F1110 Opioid abuse, uncomplicated: Secondary | ICD-10-CM

## 2020-01-19 DIAGNOSIS — F3181 Bipolar II disorder: Secondary | ICD-10-CM

## 2020-01-19 DIAGNOSIS — O99320 Drug use complicating pregnancy, unspecified trimester: Secondary | ICD-10-CM | POA: Insufficient documentation

## 2020-01-19 HISTORY — DX: Opioid abuse, uncomplicated: F11.10

## 2020-01-19 MED ORDER — QUETIAPINE FUMARATE 50 MG PO TABS: 50.0000 mg | ORAL_TABLET | Freq: Every day | ORAL | 1 refills | Status: DC

## 2020-01-19 NOTE — Progress Notes (Signed)
Psychiatric Initial Adult Assessment   I connected with  Connie Fernandez on 01/19/20 by a video enabled telemedicine application and verified that I am speaking with the correct person using two identifiers.   I discussed the limitations of evaluation and management by telemedicine. The patient expressed understanding and agreed to proceed.    Patient Identification: Connie Fernandez MRN:  [id number] Date of Evaluation:  01/19/2020   Referral Source: PCP  Chief Complaint:   " I get mad easily, I have anxiety and I am not sleeping and also using drugs."  Visit Diagnosis:    ICD-10-CM   1. Bipolar 2 disorder (HCC)  F31.81 QUEtiapine (SEROQUEL) 50 MG tablet  2. Opioid use disorder, mild, abuse (HCC)  F11.10     History of Present Illness: This is a 24 year old female with history of depression and anxiety and substance use disorder now seen for psychiatric evaluation.  Patient has a history of 1 prior psychiatry hospitalization in the past in 2016 in the context of Xanax use. Patient reported that she has history of misusing pain pills and Xanax bars that started around the age of 65 or 50.  She stated that in 2016 she was hospitalized following confusion secondary to use of 3 Xanax bars.  She stated that after she was discharged she was in a stable relationship that lasted till June 2020.  Patient stated that she was sober since her discharge from the hospital in 2016 to June 2020.  She relapsed on prescription pain pills mainly Percocets in July 2020.  She stated that since then she has been using 1 tablet usually at night almost every day.  She informed that her last use was day before yesterday.  She buys the tablets from her friend who sells them for $5-$8 per tablet.  She stated that she knows that this is not right and therefore she wants to stop.  She stated that she does not use any other illicit substances although she did try Xanax maybe a week or so ago which did not go well for  her.  Patient stated that since she was in high school she has had irritability and mood swings.  She stated that she would have days when she would feel very depressed with low energy levels, anhedonia, poor concentration, poor appetite and poor sleep. These days alternate with periods of time when she has elevated mood and increased energy levels.  During these times she can stay up all night as she does not feel the need to sleep.  She stated that she has these episodes when she gets very impulsive and goes on shopping sprees.  She stated that about a week ago she went for shopping with a friend and spent all her paycheck on things that she did not need to buy.  She gets irritated very easily and gets mad at people resulting in altercations.  She reported having racing thoughts and distractibility.  She stated that she cannot focus at work and needs to work very hard in staying on track.  She stated that during conversations with her friends she may jump from one topic to another.  She also acknowledged having hypersexual behaviors at times where she has done things impulsively.  Patient reported that she was prescribed sertraline back in 2016 after she was discharged from the hospital.  She took it for some time but did not find it to be helpful.  She stated that she felt weird after taking it.  She did  see a therapist at [facility] agency for a few months and then stopped going as she felt she did not need it anymore.  Sometime later she was prescribed Lexapro by her PCP which did not help much either.  She did see a therapist for about 6 months between 2017 and 2018.  She also started seeing any therapist end of last year however due to unclear reasons the appointments have been canceled in the system.  Patient stated that she want to quit her habit of pain pill use and wants her mood to be stable so that she can do well professionally and personally.  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   Associated Signs/Symptoms: Depression Symptoms:  depressed mood, anhedonia, insomnia, difficulty concentrating, anxiety, decreased appetite, (Hypo) Manic Symptoms:  Distractibility, Elevated Mood, Flight of Ideas, Licensed conveyancer, Impulsivity, Irritable Mood, Labiality of Mood, Anxiety Symptoms:  Excessive Worry, Psychotic Symptoms:  denied PTSD Symptoms: Negative  Past Psychiatric History: Depression, anxiety, 1 psychiatric admission in 2016 at [facility] psychiatry unit  Previous Psychotropic Medications: Yes -Sertraline, Lexapro  Substance Abuse History in the last 12 months:  Yes.  -Takes 1 Percocet 5 mg tablet almost every night.  Buys it from a friend  Consequences of Substance Abuse: Negative  Past Medical History:  Past Medical History:  Diagnosis Date  . Anemia   . Anxiety   . Depression   . Dysmenorrhea in adolescent   . Endometriosis   . Headache    Migraines  . Heart palpitations   . Kidney stone   . Shortness of breath dyspnea    with exertion  . UTI (lower urinary tract infection)     Past Surgical History:  Procedure Laterality Date  . LAPAROSCOPY N/A 06/12/2016   Procedure: LAPAROSCOPY DIAGNOSTIC;  Surgeon: Allie Bossier, MD;  Location: [facility];  Service: Gynecology;  Laterality: N/A;    Family Psychiatric History: Father-alcohol abuse  Family History:  Family History  Problem Relation Age of Onset  . Diabetes Mother   . Alcohol abuse Father   . Hypertension Father   . COPD Maternal Grandmother   . Early death Maternal Grandmother 58  . Heart disease Maternal Grandmother   . Heart disease Maternal Grandfather 72  . Parkinsonism Paternal Grandmother     Social History:   Social History   Socioeconomic History  . Marital status: Single    Spouse name: Not on file  . Number of children: Not on file  . Years of education: Not on file  . Highest education level:  Not on file  Occupational History  . Not on file  Tobacco Use  . Smoking status: Current Every Day Smoker    Packs/day: 0.00    Years: 1.00    Pack years: 0.00    Types: E-cigarettes  . Smokeless tobacco: Never Used  Substance and Sexual Activity  . Alcohol use: Yes    Alcohol/week: 0.0 standard drinks    Comment: few drinks per month.   . Drug use: No  . Sexual activity: Not on file  Other Topics Concern  . 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 Difficulty of Paying Living Expenses:   Food Insecurity:   . Worried About Programme researcher, broadcasting/film/video in the Last Year:   . Barista in the Last Year:   Transportation Needs:   . Freight forwarder (Medical):   Marland Kitchen Lack of Transportation (Non-Medical):   Physical Activity:   . Days of Exercise per Week:   . Minutes of Exercise per Session:   Stress:   . Feeling of Stress :   Social Connections:   . Frequency of Communication with Friends and Family:   . Frequency of Social Gatherings with Friends and Family:   . Attends Religious Services:   . Active Member of Clubs or Organizations:   . Attends Banker Meetings:   Marland Kitchen Marital Status:     Additional Social History: Works as an International aid/development worker at [employer] in [employer] in [place].  Allergies:   Allergies  Allergen Reactions  . Latex Itching  . Penicillins Hives    Has patient had a PCN reaction causing immediate rash, facial/tongue/throat swelling, SOB or lightheadedness with hypotension: Dizziness and shaky. Has patient had a PCN reaction causing severe rash involving mucus membranes or skin necrosis: NO Has patient had a PCN reaction that required hospitalization: NO Has patient had a PCN reaction occurring within  the last 10 years: NO If all of the above answers are "NO", then may proceed with Cephalosporin use.     Metabolic Disorder Labs: Lab Results  Component Value Date   HGBA1C 5.6 12/17/2019   No results found for: PROLACTIN Lab Results  Component Value Date   CHOL 145 12/17/2019   TRIG 87.0 12/17/2019   HDL 58.20 12/17/2019   CHOLHDL 2 12/17/2019   VLDL 17.4 12/17/2019   LDLCALC 69 12/17/2019   Lab Results  Component Value Date   TSH 1.50 12/17/2019    Therapeutic Level Labs: No results found for: LITHIUM No results found for: CBMZ No results found for: VALPROATE  Current Medications: Current Outpatient Medications  Medication Sig Dispense Refill  . Cholecalciferol 1.25 MG (50000 UT) TABS 50,000 units PO qwk for 8 weeks. 8 tablet 0  . naproxen (NAPROSYN) 500 MG tablet Take 1 tablet (500 mg total) by mouth 2 (two) times daily with a meal. Take as needed for pain (Patient not taking: Reported on 12/15/2019) 30 tablet 2  . QUEtiapine (SEROQUEL) 50 MG tablet Take 1 tablet (50 mg total) by mouth at bedtime. 30 tablet 1   No current facility-administered medications for this visit.   Musculoskeletal: Strength & Muscle Tone: unable to assess due to telemed visit Gait & Station: unable to assess due to telemed visit Patient leans: unable to assess due to telemed visit  Psychiatric Specialty Exam: Review of Systems  There were no vitals taken for this visit.There is no height or weight on file to calculate BMI.  General Appearance: Well Groomed  Eye Contact:  Good  Speech:  Clear and Coherent and Normal Rate  Volume:  Normal  Mood:  Anxious  Affect:  Congruent  Thought Process:  Goal Directed, Linear and Descriptions of Associations: Intact  Orientation:  Full (Time, Place, and Person)  Thought Content:  Logical  Suicidal Thoughts:  No  Homicidal Thoughts:  No  Memory:  Immediate;   Good Recent;   Good  Judgement:  Fair  Insight:  Fair  Psychomotor Activity:  Normal   Concentration:  Concentration: Good and Attention Span: Good  Recall:  Roel Cluck of Knowledge:Good  Language: Good  Akathisia:  Negative  Handed:  Right  AIMS (if indicated):  Not done  Assets:  Communication Skills Desire for Improvement Financial Resources/Insurance Housing  ADL's:  Intact  Cognition: WNL  Sleep:  Poor   Screenings: AUDIT     Admission (Discharged) from 10/28/2015 in [place]  Alcohol Use Disorder Identification Test Final Score (AUDIT)  2    PHQ2-9     Office Visit from 10/13/2018 in [facility] Office Visit from 08/05/2017 in [place] Office Visit from 07/02/2016 in [place] Office Visit from 11/23/2015 in [place]  PHQ-2 Total Score  2  0  3  2  PHQ-9 Total Score  11  -  14  10      Assessment and Plan: Based on her assessment patient meets her here for bipolar 2 disorder with periods of cycling of depression as well as hypomanic symptoms.  Patient also is acknowledging regular use of prescription pain pills that she is buying from a friend.  Patient wants to quit that habit.  She is already seeing therapist however due to unclear reasons the appointments have been canceled.  Patient is agreeable to trial of Seroquel to target her mood, poor sleep, anxiety.  1. Bipolar 2 disorder (HCC)  -Start QUEtiapine (SEROQUEL) 50 MG tablet; Take 1 tablet (50 mg total) by mouth at bedtime.  Dispense: 30 tablet; Refill: 1  2. Opioid use disorder, mild, abuse ([place]) -Patient was encouraged to abstain from using pain pills.   Follow-up in 4 weeks. She may need to be connected to a different therapist in the near future.  Nevada Crane, MD 3/16/20212:27 PM

## 2020-01-20 DIAGNOSIS — L308 Other specified dermatitis: Secondary | ICD-10-CM | POA: Diagnosis not present

## 2020-01-21 ENCOUNTER — Ambulatory Visit: Payer: BC Managed Care – PPO | Admitting: Psychology

## 2020-01-25 NOTE — Telephone Encounter (Signed)
Mail letter to schedule a follow up with [place]

## 2020-01-26 NOTE — Telephone Encounter (Signed)
Letter mailed

## 2020-01-28 ENCOUNTER — Ambulatory Visit: Payer: BC Managed Care – PPO | Admitting: Psychology

## 2020-02-04 ENCOUNTER — Ambulatory Visit: Payer: BC Managed Care – PPO | Admitting: Psychology

## 2020-02-16 ENCOUNTER — Encounter: Payer: Self-pay | Admitting: Psychiatry

## 2020-02-16 ENCOUNTER — Other Ambulatory Visit: Payer: Self-pay

## 2020-02-16 ENCOUNTER — Ambulatory Visit (INDEPENDENT_AMBULATORY_CARE_PROVIDER_SITE_OTHER): Payer: BC Managed Care – PPO | Admitting: Psychiatry

## 2020-02-16 DIAGNOSIS — F1110 Opioid abuse, uncomplicated: Secondary | ICD-10-CM

## 2020-02-16 DIAGNOSIS — F3181 Bipolar II disorder: Secondary | ICD-10-CM | POA: Diagnosis not present

## 2020-02-16 MED ORDER — HYDROXYZINE HCL 10 MG PO TABS: 10.0000 mg | ORAL_TABLET | Freq: Two times a day (BID) | ORAL | 0 refills | Status: DC | PRN

## 2020-02-16 MED ORDER — LURASIDONE HCL 20 MG PO TABS: ORAL_TABLET | ORAL | 0 refills | Status: DC

## 2020-02-16 NOTE — Progress Notes (Signed)
BH MD/PA/NP OP Progress Note  I connected with  Connie Fernandez on 02/16/20 by a video enabled telemedicine application and verified that I am speaking with the correct person using two identifiers.   I discussed the limitations of evaluation and management by telemedicine. The patient expressed understanding and agreed to proceed.    02/16/2020 3:12 PM Connie Fernandez  MRN:  [id number]  Chief Complaint: " It has been a week."  HPI: Patient stated that she still continues to have mood swings, bouts of depression and irritability.  She stated that a few days ago she had a meltdown at work and she called it quits.  She told him that she would never come back.  However she went back to work yesterday and no actions were taken against her.  She stated that Seroquel only helps her with sleep however her mood is still the same.  She stated that she was feeling irritable during the daytime on one occasion and she took 1 dose in the daytime which made her fall asleep in 30 minutes.  She stated that she wants to have something that she can take when she feels very upset and anxious.  Regarding pain pills, she stated that she has still been taking 1 pill every other day during the daytime and it helps her stay calm and less irritable.  Patient was offered Latuda to help her with mood swings and was offered hydroxyzine for as needed use.  Patient was agreeable to try this combination.  Visit Diagnosis:    ICD-10-CM   1. Bipolar 2 disorder (HCC)  F31.81   2. Opioid use disorder, mild, abuse (HCC)  F11.10     Past Psychiatric History: Bipolar 2 d/o, opioid use d/o  Past Medical History:  Past Medical History:  Diagnosis Date  . Anemia   . Anxiety   . Depression   . Dysmenorrhea in adolescent   . Endometriosis   . Headache    Migraines  . Heart palpitations   . Kidney stone   . Shortness of breath dyspnea    with exertion  . UTI (lower urinary tract infection)     Past Surgical  History:  Procedure Laterality Date  . LAPAROSCOPY N/A 06/12/2016   Procedure: LAPAROSCOPY DIAGNOSTIC;  Surgeon: Allie Bossier, MD;  Location: [facility];  Service: Gynecology;  Laterality: N/A;    Family Psychiatric History: see below  Family History:  Family History  Problem Relation Age of Onset  . Diabetes Mother   . Alcohol abuse Father   . Hypertension Father   . COPD Maternal Grandmother   . Early death Maternal Grandmother 23  . Heart disease Maternal Grandmother   . Heart disease Maternal Grandfather 72  . Parkinsonism Paternal Grandmother     Social History:  Social History   Socioeconomic History  . Marital status: Single    Spouse name: Not on file  . Number of children: Not on file  . Years of education: Not on file  . Highest education level: Not on file  Occupational History  . Not on file  Tobacco Use  . Smoking status: Current Every Day Smoker    Packs/day: 0.00    Years: 1.00    Pack years: 0.00    Types: E-cigarettes  . Smokeless tobacco: Never Used  Substance and Sexual Activity  . Alcohol use: Yes    Alcohol/week: 0.0 standard drinks    Comment: few drinks per month.   . Drug use: No  .  Sexual activity: Not on file  Other Topics Concern  . 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 Difficulty of Paying Living Expenses:   Food Insecurity:   . Worried About Programme researcher, broadcasting/film/video in the Last Year:   . Barista in the Last Year:   Transportation Needs:   . Freight forwarder (Medical):   Marland Kitchen Lack of Transportation (Non-Medical):   Physical Activity:   . Days of Exercise per Week:   . Minutes of Exercise per Session:   Stress:   . Feeling of Stress :   Social Connections:   . Frequency of Communication with Friends  and Family:   . Frequency of Social Gatherings with Friends and Family:   . Attends Religious Services:   . Active Member of Clubs or Organizations:   . Attends Banker Meetings:   Marland Kitchen Marital Status:     Allergies:  Allergies  Allergen Reactions  . Latex Itching  . Penicillins Hives    Has patient had a PCN reaction causing immediate rash, facial/tongue/throat swelling, SOB or lightheadedness with hypotension: Dizziness and shaky. Has patient had a PCN reaction causing severe rash involving mucus membranes or skin necrosis: NO Has patient had a PCN reaction that required hospitalization: NO Has patient had a PCN reaction occurring within the last 10 years: NO If all of the above answers are "NO", then may proceed with Cephalosporin use.     Metabolic Disorder Labs: Lab Results  Component Value Date   HGBA1C 5.6 12/17/2019   No results found for: PROLACTIN Lab Results  Component Value Date   CHOL 145 12/17/2019   TRIG 87.0 12/17/2019   HDL 58.20 12/17/2019   CHOLHDL 2 12/17/2019   VLDL 17.4 12/17/2019   LDLCALC 69 12/17/2019   Lab Results  Component Value Date   TSH 1.50 12/17/2019   TSH 0.44 10/13/2018    Therapeutic Level Labs: No results found for: LITHIUM No results found for: VALPROATE No components found for:  CBMZ  Current Medications: Current Outpatient Medications  Medication Sig Dispense Refill  . Cholecalciferol 1.25 MG (50000 UT) TABS 50,000 units PO qwk for 8 weeks. 8 tablet 0  . naproxen (NAPROSYN) 500 MG tablet Take 1 tablet (500 mg total) by mouth 2 (two) times daily with a meal. Take as needed for pain (Patient not taking: Reported on 12/15/2019) 30 tablet 2   No current facility-administered medications for this visit.    Musculoskeletal: Strength & Muscle Tone: unable to assess due to telemed visit Gait & Station: unable to assess due to telemed visit Patient leans: unable to assess due to telemed visit   Psychiatric Specialty  Exam: Review of Systems  There were no vitals taken for this visit.There is no height or weight on file to calculate BMI.  General Appearance: Well Groomed  Eye Contact:  Good  Speech:  Clear and Coherent and Normal Rate  Volume:  Normal  Mood:  Irritable  Affect:  Congruent  Thought Process:  Goal Directed and Descriptions of Associations: Intact  Orientation:  Full (Time, Place, and Person)  Thought Content: Logical   Suicidal Thoughts:  No  Homicidal Thoughts:  No  Memory:  Immediate;   Good Recent;   Good  Judgement:  Fair  Insight:  Fair  Psychomotor Activity:  Normal  Concentration:  Concentration: Good and Attention Span: Good  Recall:  Good  Fund of Knowledge: Good  Language: Good  Akathisia:  Negative  Handed:  Right  AIMS (if indicated): not done  Assets:  Communication Skills Desire for Improvement Financial Resources/Insurance Housing Transportation  ADL's:  Intact  Cognition: WNL  Sleep:  Fair   Screenings: AUDIT     Admission (Discharged) from 10/28/2015 in [place]  Alcohol Use Disorder Identification Test Final Score (AUDIT)  2    PHQ2-9     Office Visit from 10/13/2018 in [place] Office Visit from 08/05/2017 in [place] Visit from 07/02/2016 in [place] Office Visit from 11/23/2015 in [place]  PHQ-2 Total Score  2  0  3  2  PHQ-9 Total Score  11  --  14  10       Assessment and Plan: Patient did not find Seroquel to be effective in controlling her mood swings and irritability.  She was offered Latuda to target her mood swings and irritability.  She was also offered hydroxyzine for as needed use for breakthrough anxiety and upset. Potential side effects of medication and risks vs benefits of treatment vs non-treatment were explained and discussed. All questions were answered.  1. Bipolar 2 disorder (HCC)  - lurasidone (LATUDA)  20 MG TABS tablet; Take 1 tablet with dinner.  Dispense: 20 tablet; Refill: 0 - hydrOXYzine (ATARAX/VISTARIL) 10 MG tablet; Take 1 tablet (10 mg total) by mouth 2 (two) times daily as needed for anxiety.  Dispense: 60 tablet; Refill: 0  2. Opioid use disorder, mild, abuse ([place]) -Patient is still using 1 tablet every other day, patient was strongly reinforced to cut down her use and to avoid using it as much as possible.  F/up in 1 month.  [place] Crane, MD 02/16/2020, 3:12 PM

## 2020-02-26 ENCOUNTER — Encounter: Payer: Self-pay | Admitting: Family

## 2020-03-16 ENCOUNTER — Other Ambulatory Visit: Payer: Self-pay

## 2020-03-16 ENCOUNTER — Telehealth (INDEPENDENT_AMBULATORY_CARE_PROVIDER_SITE_OTHER): Payer: BC Managed Care – PPO | Admitting: Psychiatry

## 2020-03-16 ENCOUNTER — Ambulatory Visit: Payer: BC Managed Care – PPO | Admitting: Psychiatry

## 2020-03-16 ENCOUNTER — Encounter: Payer: Self-pay | Admitting: Psychiatry

## 2020-03-16 DIAGNOSIS — F3181 Bipolar II disorder: Secondary | ICD-10-CM

## 2020-03-16 DIAGNOSIS — F1110 Opioid abuse, uncomplicated: Secondary | ICD-10-CM

## 2020-03-16 MED ORDER — HYDROXYZINE HCL 10 MG PO TABS: 10.0000 mg | ORAL_TABLET | Freq: Two times a day (BID) | ORAL | 0 refills | Status: DC | PRN

## 2020-03-16 NOTE — Progress Notes (Addendum)
BH MD/PA/NP OP Progress Note  I connected with  Connie Fernandez on 03/16/20 by a video enabled telemedicine application and verified that I am speaking with the correct person using two identifiers.   I discussed the limitations of evaluation and management by telemedicine. The patient expressed understanding and agreed to proceed.    03/16/2020 2:48 PM Connie Fernandez  MRN:  [id number]  Chief Complaint: " I have been ok."  HPI: Patient report that she is doing well overall. She was prescribed Latuda at her last visit 4 weeks ago however she has not tried the medication at this time. She reports that her pharmacy could not fill the medication so her script was transferred to the [facility]. She has not picked up the medication from [facility]. She reports that she still maintains her job. She did not report any recent behavioral outburst while at work. She continues to take hydroxyzine four to five times per week to help manage the anxiety. She notes that she has cut back on taking pain pill. She reports that she takes pain pills 1-2 times per week and at times she takes none at all. She is agreeable to trying Latuda for management of mood and irritability. No other concerns noted at this time.     Visit Diagnosis:    ICD-10-CM   1. Bipolar 2 disorder (HCC)  F31.81 hydrOXYzine (ATARAX/VISTARIL) 10 MG tablet  2. Opioid use disorder, mild, abuse (HCC)  F11.10     Past Psychiatric History: Bipolar 2 d/o, opioid use d/o  Past Medical History:  Past Medical History:  Diagnosis Date  . Anemia   . Anxiety   . Depression   . Dysmenorrhea in adolescent   . Endometriosis   . Headache    Migraines  . Heart palpitations   . Kidney stone   . Shortness of breath dyspnea    with exertion  . UTI (lower urinary tract infection)     Past Surgical History:  Procedure Laterality Date  . LAPAROSCOPY N/A 06/12/2016   Procedure: LAPAROSCOPY DIAGNOSTIC;  Surgeon: Emily Filbert, MD;   Location: [facility];  Service: Gynecology;  Laterality: N/A;    Family Psychiatric History: see below  Family History:  Family History  Problem Relation Age of Onset  . Diabetes Mother   . Alcohol abuse Father   . Hypertension Father   . COPD Maternal Grandmother   . Early death Maternal Grandmother 62  . Heart disease Maternal Grandmother   . Heart disease Maternal Grandfather 72  . Parkinsonism Paternal Grandmother     Social History:  Social History   Socioeconomic History  . Marital status: Single    Spouse name: Not on file  . Number of children: Not on file  . Years of education: Not on file  . Highest education level: Not on file  Occupational History  . Not on file  Tobacco Use  . Smoking status: Current Every Day Smoker    Packs/day: 0.00    Years: 1.00    Pack years: 0.00    Types: E-cigarettes  . Smokeless tobacco: Never Used  Substance and Sexual Activity  . Alcohol use: Yes    Alcohol/week: 0.0 standard drinks    Comment: few drinks per month.   . Drug use: No  . Sexual activity: Not on file  Other Topics Concern  . Not on file  Social History Narrative   Lives with mother, father, and 2 sisters    [name] and  works [employer]    1 dog    Right handed    Enjoys working, Chief of Staff out with friends         Social Determinants of Corporate investment banker Strain:   . Difficulty of Paying Living Expenses:   Food Insecurity:   . Worried About Programme researcher, broadcasting/film/video in the Last Year:   . Barista in the Last Year:   Transportation Needs:   . Freight forwarder (Medical):   Marland Kitchen Lack of Transportation (Non-Medical):   Physical Activity:   . Days of Exercise per Week:   . Minutes of Exercise per Session:   Stress:   . Feeling of Stress :   Social Connections:   . Frequency of Communication with Friends and Family:   . Frequency of Social Gatherings with Friends and Family:   . Attends Religious Services:   . Active Member of Clubs  or Organizations:   . Attends Banker Meetings:   Marland Kitchen Marital Status:     Allergies:  Allergies  Allergen Reactions  . Latex Itching  . Penicillins Hives    Has patient had a PCN reaction causing immediate rash, facial/tongue/throat swelling, SOB or lightheadedness with hypotension: Dizziness and shaky. Has patient had a PCN reaction causing severe rash involving mucus membranes or skin necrosis: NO Has patient had a PCN reaction that required hospitalization: NO Has patient had a PCN reaction occurring within the last 10 years: NO If all of the above answers are "NO", then may proceed with Cephalosporin use.     Metabolic Disorder Labs: Lab Results  Component Value Date   HGBA1C 5.6 12/17/2019   No results found for: PROLACTIN Lab Results  Component Value Date   CHOL 145 12/17/2019   TRIG 87.0 12/17/2019   HDL 58.20 12/17/2019   CHOLHDL 2 12/17/2019   VLDL 17.4 12/17/2019   LDLCALC 69 12/17/2019   Lab Results  Component Value Date   TSH 1.50 12/17/2019   TSH 0.44 10/13/2018    Therapeutic Level Labs: No results found for: LITHIUM No results found for: VALPROATE No components found for:  CBMZ  Current Medications: Current Outpatient Medications  Medication Sig Dispense Refill  . Cholecalciferol 1.25 MG (50000 UT) TABS 50,000 units PO qwk for 8 weeks. 8 tablet 0  . hydrOXYzine (ATARAX/VISTARIL) 10 MG tablet Take 1 tablet (10 mg total) by mouth 2 (two) times daily as needed for anxiety. 60 tablet 0  . lurasidone (LATUDA) 20 MG TABS tablet Take 1 tablet with dinner. 20 tablet 0  . naproxen (NAPROSYN) 500 MG tablet Take 1 tablet (500 mg total) by mouth 2 (two) times daily with a meal. Take as needed for pain (Patient not taking: Reported on 12/15/2019) 30 tablet 2   No current facility-administered medications for this visit.    Musculoskeletal: Strength & Muscle Tone: unable to assess due to telemed visit Gait & Station: unable to assess due to telemed  visit Patient leans: unable to assess due to telemed visit   Psychiatric Specialty Exam: Review of Systems  There were no vitals taken for this visit.There is no height or weight on file to calculate BMI.  General Appearance: Well Groomed  Eye Contact:  Good  Speech:  Clear and Coherent and Normal Rate  Volume:  Normal  Mood:  Euthymic  Affect:  Congruent  Thought Process:  Goal Directed and Descriptions of Associations: Intact  Orientation:  Full (Time, Place, and Person)  Thought  Content: Logical   Suicidal Thoughts:  No  Homicidal Thoughts:  No  Memory:  Immediate;   Good Recent;   Good  Judgement:  Fair  Insight:  Fair  Psychomotor Activity:  Normal  Concentration:  Concentration: Good and Attention Span: Good  Recall:  Good  Fund of Knowledge: Good  Language: Good  Akathisia:  Negative  Handed:  Right  AIMS (if indicated): not done  Assets:  Communication Skills Desire for Improvement Financial Resources/Insurance Housing Transportation  ADL's:  Intact  Cognition: WNL  Sleep:  Fair   Screenings: AUDIT     Admission (Discharged) from 10/28/[facility] in [facility]  Alcohol Use Disorder Identification Test Final Score (AUDIT)  2    PHQ2-9     Office Visit from 10/13/2018 in [facility] Office Visit from 08/05/2017 in [facility] Office Visit from 07/02/2016 in [facility] [place] Office Visit from 11/23/2015 in [facility]  PHQ-2 Total Score  2  0  3  2  PHQ-9 Total Score  11  --  14  10       Assessment and Plan: Patient reports that she is doing well over all. She has not started taking latuda due delay in filling the prescription by her pharmacy. She is agreeable to taking the medication for mood and irritability. She plans to pick up the prescription from [facility] soon.  She reports that hydroxyzine has been effective in managing  breakthrough anxiety. She reports that she  has cut down on use of pain pill. Patient encouraged to continue to reduce use.  No other concerns noted at this time.   1. Bipolar 2 disorder (HCC)  - hydrOXYzine (ATARAX/VISTARIL) 10 MG tablet; Take 1 tablet (10 mg total) by mouth 2 (two) times daily as needed for anxiety.  Dispense: 60 tablet; Refill: 0  -Start Latuda 20mg  Daily for mood and irritability.    2. Opioid use disorder, mild, abuse (HCC)  - Continue to reduce use and abstain as much as possible.  F/up in 1 month.  , NP 03/16/2020, 2:48 PM   I saw and managed the pt with NP B. 05/16/2020.  Doyne Keel, MD 03/16/2020 3:19 PM

## 2020-03-29 ENCOUNTER — Ambulatory Visit: Payer: BC Managed Care – PPO | Admitting: Psychiatry

## 2020-04-13 ENCOUNTER — Telehealth: Payer: BC Managed Care – PPO | Admitting: Psychiatry

## 2020-04-13 ENCOUNTER — Telehealth (HOSPITAL_COMMUNITY): Payer: BC Managed Care – PPO | Admitting: Psychiatry

## 2020-04-13 ENCOUNTER — Other Ambulatory Visit: Payer: Self-pay

## 2020-07-15 ENCOUNTER — Encounter: Payer: Self-pay | Admitting: Radiology

## 2020-09-05 ENCOUNTER — Encounter: Payer: Self-pay | Admitting: Family

## 2020-10-17 ENCOUNTER — Encounter: Payer: Self-pay | Admitting: Family

## 2020-11-15 ENCOUNTER — Encounter: Payer: Self-pay | Admitting: Family

## 2020-11-15 ENCOUNTER — Ambulatory Visit (INDEPENDENT_AMBULATORY_CARE_PROVIDER_SITE_OTHER): Payer: BC Managed Care – PPO | Admitting: Family

## 2020-11-15 ENCOUNTER — Other Ambulatory Visit: Payer: Self-pay

## 2020-11-15 DIAGNOSIS — F1110 Opioid abuse, uncomplicated: Secondary | ICD-10-CM | POA: Diagnosis not present

## 2020-11-15 NOTE — Progress Notes (Signed)
Subjective:    Patient ID: Connie Fernandez, female    DOB: Jan 13, 1996, 25 y.o.   MRN: [id number]  CC: Connie Fernandez is a 25 y.o. female who presents today for follow up.   HPI: Accompanied by her mother.  Primary reason for visit is that she want to pursue sobriety.  She wants to feel better and she is tired of using substances to make 'feel numb' when sad or angry.  She desires  More of her life. Thinks about getting married and starting a family one day.    Currently lives in apartment with roommates.  Stopped drinking alcohol 01/2020.   She had no thoughts of herself or inflicting self harm. She denies suicide plan. She denies thoughts of hurting anyone else. She has h/o suicide attempt in which she cut her stomach. she was hospitalized at this time 10/2015 at [facility].   She has been using percocet which were laced with fentanyl, as of a month ago was using multiple times per day. Currently using percocet a couple of per week and only taking percocet that she states is not laced.  Takes gabapentin however not prescribed to her and not sure of dose.   Working at [employer] as Production designer, theatre/television/film. Reports anger.  In relationship with female who is also 'into drugs.' He is not physically or verbally abusive.    H/o bipolar. Reports periods of having more energy than usual, didn't require sleep.  Started latuda 03/16/20. Last seen Connie Cookey, NP in Dr Magdalen Spatz office 03/2020. She describes poor compliance with medications in general and would come off and on latuda. She is not medication currently.   Had seen [name] in the past for counseling. Would like to see her again.    Appointment with 12/19/20 [facility].    HISTORY:  Past Medical History:  Diagnosis Date  . Anemia   . Anxiety   . Depression   . Dysmenorrhea in adolescent   . Endometriosis   . Headache    Migraines  . Heart palpitations   . Kidney stone   . Shortness of breath dyspnea    with exertion  . UTI (lower  urinary tract infection)    Past Surgical History:  Procedure Laterality Date  . LAPAROSCOPY N/A 06/12/2016   Procedure: LAPAROSCOPY DIAGNOSTIC;  Surgeon: Allie Bossier, MD;  Location: [facility];  Service: Gynecology;  Laterality: N/A;   Family History  Problem Relation Age of Onset  . Diabetes Mother   . Alcohol abuse Father   . Hypertension Father   . COPD Maternal Grandmother   . Early death Maternal Grandmother 39  . Heart disease Maternal Grandmother   . Heart disease Maternal Grandfather 72  . Parkinsonism Paternal Grandmother     Allergies: Latex and Penicillins Current Outpatient Medications on File Prior to Visit  Medication Sig Dispense Refill  . hydrOXYzine (ATARAX/VISTARIL) 10 MG tablet Take 1 tablet (10 mg total) by mouth 2 (two) times daily as needed for anxiety. 60 tablet 0   No current facility-administered medications on file prior to visit.    Social History   Tobacco Use  . Smoking status: Current Every Day Smoker    Packs/day: 0.00    Years: 1.00    Pack years: 0.00    Types: E-cigarettes  . Smokeless tobacco: Never Used  Substance Use Topics  . Alcohol use: Yes    Alcohol/week: 0.0 standard drinks    Comment: few drinks per month.   . Drug use:  No    Review of Systems  Constitutional: Negative for chills and fever.  Respiratory: Negative for cough.   Cardiovascular: Negative for chest pain and palpitations.  Gastrointestinal: Negative for nausea and vomiting.      Objective:    BP 96/62   Pulse 81   Temp 97.6 F (36.4 C)   Ht 5\' 2"  (1.575 m)   Wt 143 lb 6.4 oz (65 kg)   SpO2 99%   BMI 26.23 kg/m  BP Readings from Last 3 Encounters:  11/15/20 96/62  08/13/19 117/70  07/14/19 113/63   Wt Readings from Last 3 Encounters:  11/15/20 143 lb 6.4 oz (65 kg)  12/15/19 143 lb (64.9 kg)  08/13/19 145 lb 9.6 oz (66 kg)    Physical Exam Vitals reviewed.  Constitutional:      Appearance: She is well-developed and well-nourished.  Eyes:      Conjunctiva/sclera: Conjunctivae normal.  Cardiovascular:     Rate and Rhythm: Normal rate and regular rhythm.     Pulses: Normal pulses.     Heart sounds: Normal heart sounds.  Pulmonary:     Effort: Pulmonary effort is normal.     Breath sounds: Normal breath sounds. No wheezing, rhonchi or rales.  Skin:    General: Skin is warm and dry.  Neurological:     Mental Status: She is alert.  Psychiatric:        Mood and Affect: Mood and affect and affect normal. Affect is not labile or flat.        Speech: Speech normal.        Behavior: Behavior normal.        Thought Content: Thought content normal.     Comments: Appropriately dressed. Tearful at times during conversation.         Assessment & Plan:   Problem List Items Addressed This Visit      Other   Opioid use disorder, mild, abuse (HCC)    Long discussion with patient and mother regarding of opioid, alcohol abuse. Currently abstaining from alcohol. Patient very appears in state of action and desiring change. H/o suicide attempt. She denies any thoughts or hurting herself or plan at this time. We discussed restarting latuda however with her poor compliance , we have decided to aggressively pursue addiction therapy and treatment with [facility]. Advised patient that in setting of bipolar she needs to be managed by psychiatry. Spoke with patient this morning 11/18/20 to ensure that she had an appointment and she called day of our appointment to schedule. She feels optmistic about her future. I will follow her closely with follow 12/27/20 and ensure she has established with psychiatry and counseling.           I have discontinued Jaeda Adelson's naproxen, Cholecalciferol, and lurasidone. I am also having her maintain her hydrOXYzine.   No orders of the defined types were placed in this encounter.   Return precautions given.   Risks, benefits, and alternatives of the medications and treatment plan prescribed today  were discussed, and patient expressed understanding.   Education regarding symptom management and diagnosis given to patient on AVS.  Continue to follow with 12/29/20, FNP for routine health maintenance.   Allegra Grana and I agreed with plan.   Wyatt Portela, FNP

## 2020-11-15 NOTE — Patient Instructions (Signed)
As discussed, please contact [facility] TODAY and call me to let know how your first appointment went  [email or web address]   24/7 HELPLINE: [phone]    If you start to have unusual thoughts, thoughts of hurting yourself, or anyone else, please go immediately to the emergency department.   Follow up in 6 weeks.   Please text to [phone] and write the word 'home'. This will put you in touch with trained crisis counselor and resources.    National Suicide Prevention Hotline - available 24 hours a day, 7 days a week.  [phone]  Major Depressive Disorder Major depressive disorder is a mental illness. It also may be called clinical depression or unipolar depression. Major depressive disorder usually causes feelings of sadness, hopelessness, or helplessness. Some people with this disorder do not feel particularly sad but lose interest in doing things they used to enjoy (anhedonia). Major depressive disorder also can cause physical symptoms. It can interfere with work, school, relationships, and other normal everyday activities. The disorder varies in severity but is longer lasting and more serious than the sadness we all feel from time to time in our lives. Major depressive disorder often is triggered by stressful life events or major life changes. Examples of these triggers include divorce, loss of your job or home, a move, and the death of a family member or close friend. Sometimes this disorder occurs for no obvious reason at all. People who have family members with major depressive disorder or bipolar disorder are at higher risk for developing this disorder, with or without life stressors. Major depressive disorder can occur at any age. It may occur just once in your life (single episode major depressive disorder). It may occur multiple times (recurrent major depressive disorder). SYMPTOMS People with major depressive disorder have either anhedonia or depressed  mood on nearly a daily basis for at least 2 weeks or longer. Symptoms of depressed mood include:  Feelings of sadness (blue or down in the dumps) or emptiness.  Feelings of hopelessness or helplessness.  Tearfulness or episodes of crying (may be observed by others).  Irritability (children and adolescents). In addition to depressed mood or anhedonia or both, people with this disorder have at least four of the following symptoms:  Difficulty sleeping or sleeping too much.    Significant change (increase or decrease) in appetite or weight.    Lack of energy or motivation.  Feelings of guilt and worthlessness.    Difficulty concentrating, remembering, or making decisions.  Unusually slow movement (psychomotor retardation) or restlessness (as observed by others).    Recurrent wishes for death, recurrent thoughts of self-harm (suicide), or a suicide attempt. People with major depressive disorder commonly have persistent negative thoughts about themselves, other people, and the world. People with severe major depressive disorder may experience distorted beliefs or perceptions about the world (psychotic delusions). They also may see or hear things that are not real (psychotic hallucinations). DIAGNOSIS Major depressive disorder is diagnosed through an assessment by your health care provider. Your health care provider will ask about aspects of your daily life, such as mood, sleep, and appetite, to see if you have the diagnostic symptoms of major depressive disorder. Your health care provider may ask about your medical history and use of alcohol or drugs, including prescription medicines. Your health care provider also may do a physical exam and blood work. This is because certain medical conditions and the use of certain substances can cause major depressive disorder-like symptoms (secondary depression). Your  health care provider also may refer you to a mental health specialist for further  evaluation and treatment. TREATMENT It is important to recognize the symptoms of major depressive disorder and seek treatment. The following treatments can be prescribed for this disorder:    Medicine. Antidepressant medicines usually are prescribed. Antidepressant medicines are thought to correct chemical imbalances in the brain that are commonly associated with major depressive disorder. Other types of medicine may be added if the symptoms do not respond to antidepressant medicines alone or if psychotic delusions or hallucinations occur.  Talk therapy. Talk therapy can be helpful in treating major depressive disorder by providing support, education, and guidance. Certain types of talk therapy also can help with negative thinking (cognitive behavioral therapy) and with relationship issues that trigger this disorder (interpersonal therapy). A mental health specialist can help determine which treatment is best for you. Most people with major depressive disorder do well with a combination of medicine and talk therapy. Treatments involving electrical stimulation of the brain can be used in situations with extremely severe symptoms or when medicine and talk therapy do not work over time. These treatments include electroconvulsive therapy, transcranial magnetic stimulation, and vagal nerve stimulation.   This information is not intended to replace advice given to you by your health care provider. Make sure you discuss any questions you have with your health care provider.   Document Released: 02/16/2013 Document Revised: 11/12/2014 Document Reviewed: 02/16/2013 Elsevier Interactive Patient Education [employer]

## 2020-11-16 ENCOUNTER — Ambulatory Visit: Payer: BC Managed Care – PPO | Admitting: Family

## 2020-11-18 NOTE — Assessment & Plan Note (Signed)
Long discussion with patient and mother regarding of opioid, alcohol abuse. Currently abstaining from alcohol. Patient very appears in state of action and desiring change. H/o suicide attempt. She denies any thoughts or hurting herself or plan at this time. We discussed restarting latuda however with her poor compliance , we have decided to aggressively pursue addiction therapy and treatment with [facility]. Advised patient that in setting of bipolar she needs to be managed by psychiatry. Spoke with patient this morning 11/18/20 to ensure that she had an appointment and she called day of our appointment to schedule. She feels optmistic about her future. I will follow her closely with follow 12/27/20 and ensure she has established with psychiatry and counseling.

## 2020-11-24 ENCOUNTER — Ambulatory Visit
Admission: EM | Admit: 2020-11-24 | Discharge: 2020-11-24 | Disposition: A | Discharge status: Home / Self Care | Payer: BC Managed Care – PPO | Attending: Family Medicine | Admitting: Family Medicine

## 2020-11-24 ENCOUNTER — Other Ambulatory Visit: Payer: Self-pay

## 2020-11-24 DIAGNOSIS — Z20822 Contact with and (suspected) exposure to covid-19: Secondary | ICD-10-CM

## 2020-11-24 NOTE — ED Triage Notes (Signed)
Pt c/o non-productive cough, HA, diarrhea onset yesterday. Last diarrhea episode last night  Denies fever, n/v, body aches, sore throat, SOB. Recently exposed to COVID positive person on Sunday.  No covid vaccines or covid infection. Last took ibuprofen yesterday

## 2020-11-24 NOTE — Discharge Instructions (Addendum)
We have tested you for COVID  You can take OTC medications as needed.  Recommend mucinex Follow up as needed for continued or worsening symptoms

## 2020-11-24 NOTE — ED Provider Notes (Signed)
Renaldo Fiddler    CSN: [id number] Arrival date & time: 11/24/20  1205      History   Chief Complaint Chief Complaint  Patient presents with  . Cough    HPI Connie Fernandez is a 25 y.o. female.   Patient is a 25 year old female who presents today with nonproductive cough, headache, diarrhea onset yesterday.  Last diarrhea episode last night.  Denies any fever, nausea, vomiting, body aches, sore throat or shortness of breath.  Exposed to COVID on Sunday.  Has been taking ibuprofen for symptoms     Past Medical History:  Diagnosis Date  . Anemia   . Anxiety   . Depression   . Dysmenorrhea in adolescent   . Endometriosis   . Headache    Migraines  . Heart palpitations   . Kidney stone   . Shortness of breath dyspnea    with exertion  . UTI (lower urinary tract infection)     Patient Active Problem List   Diagnosis Date Noted  . Bipolar 2 disorder (HCC) 01/19/2020  . Opioid use disorder, mild, abuse (HCC) 01/19/2020  . Hyperglycemia 12/15/2019  . Other fatigue 10/13/2018  . Acute cystitis with hematuria 07/25/2017  . History of drug use 07/25/2017  . Acne 02/19/2017  . Dysmenorrhea 02/19/2017  . Chronic pelvic pain in female 07/16/2016  . Tobacco use disorder 10/28/2015  . Panic disorder 10/28/2015  . Major depressive disorder, recurrent episode (HCC) 10/28/2015    Past Surgical History:  Procedure Laterality Date  . LAPAROSCOPY N/A 06/12/2016   Procedure: LAPAROSCOPY DIAGNOSTIC;  Surgeon: Allie Bossier, MD;  Location: [facility];  Service: Gynecology;  Laterality: N/A;    OB History   No obstetric history on file.      Home Medications    Prior to Admission medications   Medication Sig Start Date End Date Taking? Authorizing Provider  hydrOXYzine (ATARAX/VISTARIL) 10 MG tablet Take 1 tablet (10 mg total) by mouth 2 (two) times daily as needed for anxiety. 03/16/20   Zena Amos, MD    Family History Family History  Problem Relation Age of  Onset  . Diabetes Mother   . Alcohol abuse Father   . Hypertension Father   . COPD Maternal Grandmother   . Early death Maternal Grandmother 71  . Heart disease Maternal Grandmother   . Heart disease Maternal Grandfather 72  . Parkinsonism Paternal Grandmother     Social History Social History   Tobacco Use  . Smoking status: Current Every Day Smoker    Packs/day: 0.00    Years: 1.00    Pack years: 0.00    Types: E-cigarettes  . Smokeless tobacco: Never Used  Substance Use Topics  . Alcohol use: Yes    Alcohol/week: 0.0 standard drinks    Comment: few drinks per month.   . Drug use: No     Allergies   Latex and Penicillins   Review of Systems Review of Systems   Physical Exam Triage Vital Signs ED Triage Vitals  Enc Vitals Group     BP 11/24/20 1310 114/70     Pulse Rate 11/24/20 1310 81     Resp 11/24/20 1310 18     Temp 11/24/20 1310 98.9 F (37.2 C)     Temp Source 11/24/20 1310 Oral     SpO2 11/24/20 1310 99 %     Weight 11/24/20 1308 143 lb (64.9 kg)     Height 11/24/20 1308 5\' 3"  (1.6 m)  Head Circumference --      Peak Flow --      Pain Score 11/24/20 1308 0     Pain Loc --      Pain Edu? --      Excl. in GC? --    No data found.  Updated Vital Signs BP 114/70 (BP Location: Left Arm)   Pulse 81   Temp 98.9 F (37.2 C) (Oral)   Resp 18   Ht 5\' 3"  (1.6 m)   Wt 143 lb (64.9 kg)   LMP 11/22/2020   SpO2 99%   BMI 25.33 kg/m   Visual Acuity Right Eye Distance:   Left Eye Distance:   Bilateral Distance:    Right Eye Near:   Left Eye Near:    Bilateral Near:     Physical Exam Vitals and nursing note reviewed.  Constitutional:      General: She is not in acute distress.    Appearance: Normal appearance. She is not ill-appearing, toxic-appearing or diaphoretic.  HENT:     Head: Normocephalic.     Nose: Nose normal.     Mouth/Throat:     Pharynx: Oropharynx is clear.  Eyes:     Conjunctiva/sclera: Conjunctivae normal.   Cardiovascular:     Rate and Rhythm: Normal rate and regular rhythm.  Pulmonary:     Effort: Pulmonary effort is normal.     Breath sounds: Normal breath sounds.  Musculoskeletal:        General: Normal range of motion.     Cervical back: Normal range of motion.  Skin:    General: Skin is warm and dry.     Findings: No rash.  Neurological:     Mental Status: She is alert.  Psychiatric:        Mood and Affect: Mood normal.      UC Treatments / Results  Labs (all labs ordered are listed, but only abnormal results are displayed) Labs Reviewed  NOVEL CORONAVIRUS, NAA    EKG   Radiology No results found.  Procedures Procedures (including critical care time)  Medications Ordered in UC Medications - No data to display  Initial Impression / Assessment and Plan / UC Course  I have reviewed the triage vital signs and the nursing notes.  Pertinent labs & imaging results that were available during my care of the patient were reviewed by me and considered in my medical decision making (see chart for details).     Exposure to COVID-19 COVID test pending.  Recommend over-the-counter medications to include Mucinex for symptoms. Follow up as needed for continued or worsening symptoms  Final Clinical Impressions(s) / UC Diagnoses   Final diagnoses:  Close exposure to COVID-19 virus     Discharge Instructions     We have tested you for COVID  You can take OTC medications as needed.  Recommend mucinex Follow up as needed for continued or worsening symptoms      ED Prescriptions    None     PDMP not reviewed this encounter.   11/24/2020, NP 11/24/20 1559

## 2020-11-26 ENCOUNTER — Encounter: Payer: Self-pay | Admitting: Family

## 2020-11-26 LAB — NOVEL CORONAVIRUS, NAA: SARS-CoV-2, NAA: NOT DETECTED

## 2020-11-26 LAB — SARS-COV-2, NAA 2 DAY TAT

## 2020-11-28 ENCOUNTER — Other Ambulatory Visit: Payer: Self-pay | Admitting: Family

## 2020-11-28 DIAGNOSIS — F419 Anxiety disorder, unspecified: Secondary | ICD-10-CM

## 2020-11-28 MED ORDER — BUSPIRONE HCL 5 MG PO TABS: 5.0000 mg | ORAL_TABLET | Freq: Three times a day (TID) | ORAL | 1 refills | Status: DC | PRN

## 2020-11-28 NOTE — Telephone Encounter (Signed)
Pt returned call & knows Buspar sent to [facility]. She will update [place] on how she is doing.

## 2020-11-28 NOTE — Telephone Encounter (Signed)
I called LM that medication was sent. I asked that she update [place] on how she is doing or if she has any concerns.

## 2020-11-30 ENCOUNTER — Ambulatory Visit: Payer: BC Managed Care – PPO | Admitting: Family

## 2020-12-06 ENCOUNTER — Emergency Department: Payer: BC Managed Care – PPO

## 2020-12-06 ENCOUNTER — Encounter: Payer: Self-pay | Admitting: Family

## 2020-12-06 ENCOUNTER — Other Ambulatory Visit: Payer: Self-pay

## 2020-12-06 ENCOUNTER — Emergency Department
Admission: EM | Admit: 2020-12-06 | Discharge: 2020-12-06 | Discharge status: Against Medical Advice | Payer: BC Managed Care – PPO | Attending: Emergency Medicine | Admitting: Emergency Medicine

## 2020-12-06 DIAGNOSIS — U071 COVID-19: Secondary | ICD-10-CM | POA: Insufficient documentation

## 2020-12-06 DIAGNOSIS — Z5321 Procedure and treatment not carried out due to patient leaving prior to being seen by health care provider: Secondary | ICD-10-CM | POA: Diagnosis not present

## 2020-12-06 DIAGNOSIS — R509 Fever, unspecified: Secondary | ICD-10-CM | POA: Diagnosis present

## 2020-12-06 NOTE — ED Triage Notes (Signed)
First Nurse Note:  Tested positive for COVID on Saturday.  ARrives today c/o fever and SOB.  States she called her PCP who referred her to the ED to r/o "blood clot in lung".  Patient is AAOx3.  Skin warm and dry. No SOB/ DOE.  NAD

## 2020-12-06 NOTE — Telephone Encounter (Signed)
DX with COVID,  Saturday with home test possible contracted from boyfriend who is positive, body aches rated 9, headache since Friday.  Using DayQuil, Mucinex, Ibuprofen 1000 mg every four hours,  Fever 103, Patient having sharpe pain described as stabbing in lungs with shortness of breath advised patient she needs to be evaluated in the ER or UC now , patient agreed and going to have boyfriend drive her to ER.

## 2020-12-06 NOTE — Telephone Encounter (Signed)
Agree with evaluation now given symptoms.

## 2020-12-06 NOTE — ED Triage Notes (Signed)
Pt to ED for chief complaint of COVID + since Saturday, states she was referred by PCP for blood clot d/t shob with exertion and at rest.  Pt speaking in complete sentences, NAD noted, ambulatory to triage with no distress or labored breathing, RR even and unlabored   Discussed pt with Dr Lenard Lance, xhest x ray ordered at this time

## 2020-12-06 NOTE — ED Notes (Addendum)
Patient left  AMA per [facility]

## 2020-12-21 ENCOUNTER — Other Ambulatory Visit: Payer: Self-pay

## 2020-12-21 ENCOUNTER — Telehealth: Payer: BC Managed Care – PPO | Admitting: Family

## 2020-12-21 DIAGNOSIS — Z0289 Encounter for other administrative examinations: Secondary | ICD-10-CM

## 2020-12-27 ENCOUNTER — Ambulatory Visit (INDEPENDENT_AMBULATORY_CARE_PROVIDER_SITE_OTHER): Payer: BC Managed Care – PPO | Admitting: Family

## 2020-12-27 ENCOUNTER — Encounter: Payer: Self-pay | Admitting: Family

## 2020-12-27 ENCOUNTER — Other Ambulatory Visit: Payer: Self-pay

## 2020-12-27 VITALS — BP 124/68 | HR 85 | Temp 97.5°F | Ht 62.01 in | Wt 144.4 lb

## 2020-12-27 DIAGNOSIS — F1110 Opioid abuse, uncomplicated: Secondary | ICD-10-CM

## 2020-12-27 DIAGNOSIS — Z23 Encounter for immunization: Secondary | ICD-10-CM

## 2020-12-27 DIAGNOSIS — F3181 Bipolar II disorder: Secondary | ICD-10-CM | POA: Diagnosis not present

## 2020-12-27 NOTE — Patient Instructions (Signed)
Please start buspar for anxiety as needed  Referral to psychiatry and counseling ( [name]) Let us know if you dont hear back within a week in regards to an appointment being scheduled.

## 2020-12-27 NOTE — Assessment & Plan Note (Addendum)
Continues to use percocet. Discussed options for inpatient treatment with [facility] , or [facility] both in [place].Her mother was supportive of inpatient stay and I strongly advised her to consider this for her safety, and due to daily risk of overdose and death. Patient declines inpatient stay and would like to see psychiatry, counseling first. She denies any thoughts of hurting herself or suicide plan and states she is using opioids as way to cope with emotions. She will trial buspar which I have prescribed. Referrals placed today and will follow closely.

## 2020-12-27 NOTE — Progress Notes (Signed)
Subjective:    Patient ID: Connie Fernandez, female    DOB: 1996-09-12, 25 y.o.   MRN: [id number]  CC: Connie Fernandez is a 25 y.o. female who presents today for follow up.   HPI: Accompanied by mother today  She has established with a counselor , Connie Fernandez, whom she met with last week for the first time at [facility] in [place].Her father accompanied her.  She was told she needed to do group therapy and reports later that week being called to asked to participate in inpatient therapy. She doesn't feel she requires this at this time.She would like to return to prior counselor, Connie Fernandez and see psychiatry prior to inpatient treatment.  Continues to use opioids; she last used percocet last night. She is using percocet when feels down or angry. She hasnt used other drugs or alcohol lately.  She hasnt taken buspar as she hasnt picked up medication.  No si/hi.         HISTORY:  Past Medical History:  Diagnosis Date  . Anemia   . Anxiety   . Depression   . Dysmenorrhea in adolescent   . Endometriosis   . Headache    Migraines  . Heart palpitations   . Kidney stone   . Shortness of breath dyspnea    with exertion  . UTI (lower urinary tract infection)    Past Surgical History:  Procedure Laterality Date  . LAPAROSCOPY N/A 06/12/2016   Procedure: LAPAROSCOPY DIAGNOSTIC;  Surgeon: Connie Filbert, MD;  Location: [facility];  Service: Gynecology;  Laterality: N/A;   Family History  Problem Relation Age of Onset  . Diabetes Mother   . Alcohol abuse Father   . Hypertension Father   . COPD Maternal Grandmother   . Early death Maternal Grandmother 49  . Heart disease Maternal Grandmother   . Heart disease Maternal Grandfather 72  . Parkinsonism Paternal Grandmother     Allergies: Latex and Penicillins Current Outpatient Medications on File Prior to Visit  Medication Sig Dispense Refill  . busPIRone (BUSPAR) 5 MG tablet Take 1 tablet (5 mg total) by mouth 3 (three) times  daily as needed. 60 tablet 1   No current facility-administered medications on file prior to visit.    Social History   Tobacco Use  . Smoking status: Current Every Day Smoker    Packs/day: 0.00    Years: 1.00    Pack years: 0.00    Types: E-cigarettes  . Smokeless tobacco: Never Used  Substance Use Topics  . Alcohol use: Yes    Alcohol/week: 0.0 standard drinks    Comment: few drinks per month.   . Drug use: No    Review of Systems  Constitutional: Negative for chills and fever.  Respiratory: Negative for cough.   Cardiovascular: Negative for chest pain and palpitations.  Gastrointestinal: Negative for nausea and vomiting.  Psychiatric/Behavioral: Negative for sleep disturbance and suicidal ideas. The patient is nervous/anxious.       Objective:    BP 124/68 (BP Location: Left Arm, Patient Position: Sitting)   Pulse 85   Temp (!) 97.5 F (36.4 C)   Ht 5' 2.01" (1.575 m)   Wt 144 lb 6.4 oz (65.5 kg)   SpO2 99%   BMI 26.40 kg/m  BP Readings from Last 3 Encounters:  12/27/20 124/68  12/06/20 120/78  11/24/20 114/70   Wt Readings from Last 3 Encounters:  12/27/20 144 lb 6.4 oz (65.5 kg)  12/06/20 143 lb (64.9 kg)  11/24/20 143 lb (64.9 kg)    Physical Exam Vitals reviewed.  Constitutional:      Appearance: She is well-developed and well-nourished.  Eyes:     Conjunctiva/sclera: Conjunctivae normal.  Cardiovascular:     Rate and Rhythm: Normal rate and regular rhythm.     Pulses: Normal pulses.     Heart sounds: Normal heart sounds.  Pulmonary:     Effort: Pulmonary effort is normal.     Breath sounds: Normal breath sounds. No wheezing, rhonchi or rales.  Skin:    General: Skin is warm and dry.  Neurological:     Mental Status: She is alert.  Psychiatric:        Mood and Affect: Mood and affect normal.        Speech: Speech normal.        Behavior: Behavior normal.        Thought Content: Thought content normal.        Assessment & Plan:    Problem List Items Addressed This Visit      Other   Bipolar 2 disorder ([place]) - Primary   Relevant Orders   Ambulatory referral to Psychiatry   Ambulatory referral to Psychology   Opioid use disorder, mild, abuse ([place])    Continues to use percocet. Discussed options for inpatient treatment with [facility] , or [facility] both in [place].Her mother was supportive of inpatient stay and I strongly advised her to consider this for her safety, and due to daily risk of overdose and death. Patient declines inpatient stay and would like to see psychiatry, counseling first. She denies any thoughts of hurting herself or suicide plan and states she is using opioids as way to cope with emotions. She will trial buspar which I have prescribed. Referrals placed today and will follow closely.        Other Visit Diagnoses    Need for vaccination with 13-polyvalent pneumococcal conjugate vaccine        Of note: patient was not given prevnar 13, that was an error.   I am having Connie Fernandez maintain her busPIRone.   No orders of the defined types were placed in this encounter.   Return precautions given.   Risks, benefits, and alternatives of the medications and treatment plan prescribed today were discussed, and patient expressed understanding.   Education regarding symptom management and diagnosis given to patient on AVS.  Continue to follow with Connie Hawthorne, FNP for routine health maintenance.   Connie Fernandez and I agreed with plan.   Connie Paris, FNP

## 2020-12-28 ENCOUNTER — Telehealth: Payer: Self-pay | Admitting: Family

## 2020-12-28 NOTE — Telephone Encounter (Signed)
Left message for patient to return call back.  

## 2020-12-28 NOTE — Telephone Encounter (Signed)
Call pt After she left Tuesday I realized it has been less than a year she was seen by psychiatry  For continuity of care, I would advise for her to return to [facility] who she last last May 2021.   Please provider her with phone number and ask her she would like [place] to make an appt for her.   [phone]

## 2021-01-09 NOTE — Telephone Encounter (Signed)
LMTCB

## 2021-01-09 NOTE — Telephone Encounter (Signed)
Can you circle back here? 

## 2021-01-11 ENCOUNTER — Ambulatory Visit: Payer: BC Managed Care – PPO | Admitting: Psychology

## 2021-01-25 ENCOUNTER — Ambulatory Visit (INDEPENDENT_AMBULATORY_CARE_PROVIDER_SITE_OTHER): Payer: BC Managed Care – PPO | Admitting: Psychology

## 2021-01-25 DIAGNOSIS — F1110 Opioid abuse, uncomplicated: Secondary | ICD-10-CM | POA: Diagnosis not present

## 2021-01-25 DIAGNOSIS — F3181 Bipolar II disorder: Secondary | ICD-10-CM | POA: Diagnosis not present

## 2021-01-27 ENCOUNTER — Ambulatory Visit: Payer: BC Managed Care – PPO | Admitting: Family

## 2021-01-27 DIAGNOSIS — Z0289 Encounter for other administrative examinations: Secondary | ICD-10-CM

## 2021-02-09 ENCOUNTER — Ambulatory Visit: Payer: BC Managed Care – PPO | Admitting: Psychology

## 2021-03-01 ENCOUNTER — Ambulatory Visit: Payer: BC Managed Care – PPO | Admitting: Psychology

## 2021-03-28 ENCOUNTER — Telehealth: Payer: Self-pay | Admitting: Family

## 2021-03-28 NOTE — Telephone Encounter (Signed)
Call pt  I have not seen her in some time and I wanted to see how she was doing  Her initial referral to psychiatry was declined for some reason with Dr Evelene Croon.  Other options include   [facility]   [facility]  [facility]  Would she like a referral to above?

## 2021-03-28 NOTE — Telephone Encounter (Signed)
LMTCB

## 2021-04-14 NOTE — Telephone Encounter (Signed)
Please mail letter to make a follow up appt

## 2021-04-14 NOTE — Telephone Encounter (Signed)
Letter printed and sent to patient

## 2021-08-01 ENCOUNTER — Ambulatory Visit (INDEPENDENT_AMBULATORY_CARE_PROVIDER_SITE_OTHER): Payer: BC Managed Care – PPO | Admitting: Family

## 2021-08-01 ENCOUNTER — Telehealth: Payer: Self-pay

## 2021-08-01 ENCOUNTER — Encounter: Payer: Self-pay | Admitting: Family

## 2021-08-01 ENCOUNTER — Other Ambulatory Visit: Payer: Self-pay

## 2021-08-01 VITALS — BP 92/64 | HR 64 | Temp 97.7°F | Ht 62.0 in | Wt 150.4 lb

## 2021-08-01 DIAGNOSIS — K2971 Gastritis, unspecified, with bleeding: Secondary | ICD-10-CM | POA: Diagnosis not present

## 2021-08-01 DIAGNOSIS — K2970 Gastritis, unspecified, without bleeding: Secondary | ICD-10-CM | POA: Insufficient documentation

## 2021-08-01 DIAGNOSIS — F17200 Nicotine dependence, unspecified, uncomplicated: Secondary | ICD-10-CM | POA: Diagnosis not present

## 2021-08-01 DIAGNOSIS — F3181 Bipolar II disorder: Secondary | ICD-10-CM | POA: Diagnosis not present

## 2021-08-01 HISTORY — DX: Gastritis, unspecified, without bleeding: K29.70

## 2021-08-01 MED ORDER — PANTOPRAZOLE SODIUM 40 MG PO TBEC: 40.0000 mg | DELAYED_RELEASE_TABLET | Freq: Every morning | ORAL | 0 refills | Status: DC

## 2021-08-01 MED ORDER — NICOTINE 21 MG/24HR TD PT24: 21.0000 mg | MEDICATED_PATCH | Freq: Every day | TRANSDERMAL | 5 refills | Status: DC

## 2021-08-01 NOTE — Chronic Care Management (AMB) (Signed)
  Care Management   Outreach Note  08/01/2021 Name: Connie Fernandez MRN: [id number] DOB: [birth year]  Referred by: Allegra Grana, FNP Reason for referral : Care Coordination (Outreach to schedule referral with Pharm D )   An unsuccessful telephone outreach was attempted today. The patient was referred to the case management team for assistance with care management and care coordination.   Follow Up Plan:  A HIPAA compliant phone message was left for the patient providing contact information and requesting a return call.  The care management team will reach out to the patient again over the next 7 days.  If patient returns call to provider office, please advise to call Embedded Care Management Care Guide Penne Lash at [phone]  Penne Lash, RMA Care Guide, Embedded Care Coordination [facility]  [place], [place] [address] Direct Dial: [phone] [email or web address]

## 2021-08-01 NOTE — Progress Notes (Signed)
Subjective:    Patient ID: Connie Fernandez, female    DOB: 1996-05-25, 25 y.o.   MRN: [id number]  CC: Connie Fernandez is a 25 y.o. female who presents today for follow up.   HPI: Here for emergency room follow-up Accompanied with mother Abdominal pain, vomiting completely resolved.  No nsaid use. She has stopped drinking alcohol completely. She vapes tobacco products daily.She vapes 'all day'.   Compliant Protonix 40 mg taken in the afternoon   No epigastric burning, fever, dysuria.      Presented to [facility] emergency room on/15/2022 for left upper quadrant pain and emesis with occasional blood-tinged.  CBC without anemia.  No electrolyte disturbances.  Lipase 42.  Urine pregnancy test was negative. Primary considerations are gastritis, gastric ulcer.  No imaging.  Discharged on Protonix 40 mg QD   Had been drinking Alcohol use 10 shots per day  history of opioid use disorder.  She was seen [facility] . Last time used opioids 1.5 month ago.   Denies depression.  No thoughts of hurting herself or anyone else.  Occasionally she will have anxiety that she will take BuSpar 5 mg with relief.  This medication has been helpful for her. HISTORY:  Past Medical History:  Diagnosis Date   Anemia    Anxiety    Depression    Dysmenorrhea in adolescent    Endometriosis    Headache    Migraines   Heart palpitations    Kidney stone    Shortness of breath dyspnea    with exertion   UTI (lower urinary tract infection)    Past Surgical History:  Procedure Laterality Date   LAPAROSCOPY N/A 06/12/2016   Procedure: LAPAROSCOPY DIAGNOSTIC;  Surgeon: Connie Bossier, MD;  Location: [facility];  Service: Gynecology;  Laterality: N/A;   Family History  Problem Relation Age of Onset   Diabetes Mother    Alcohol abuse Father    Hypertension Father    COPD Maternal Grandmother    Early death Maternal Grandmother 2   Heart disease Maternal Grandmother    Heart disease  Maternal Grandfather 22   Parkinsonism Paternal Grandmother     Allergies: Latex and Penicillins Current Outpatient Medications on File Prior to Visit  Medication Sig Dispense Refill   busPIRone (BUSPAR) 5 MG tablet Take 1 tablet (5 mg total) by mouth 3 (three) times daily as needed. (Patient taking differently: Take 5 mg by mouth as needed.) 60 tablet 1   No current facility-administered medications on file prior to visit.    Social History   Tobacco Use   Smoking status: Every Day    Packs/day: 0.00    Years: 1.00    Pack years: 0.00    Types: E-cigarettes, Cigarettes   Smokeless tobacco: Never  Substance Use Topics   Alcohol use: Yes    Alcohol/week: 0.0 standard drinks    Comment: few drinks per month.    Drug use: No    Review of Systems  Constitutional:  Negative for chills and fever.  Respiratory:  Negative for cough.   Cardiovascular:  Negative for chest pain and palpitations.  Gastrointestinal:  Negative for nausea and vomiting.  Psychiatric/Behavioral:  Negative for sleep disturbance and suicidal ideas. The patient is not nervous/anxious (controlled).      Objective:    BP 92/64 (BP Location: Left Arm, Patient Position: Sitting, Cuff Size: Normal)   Pulse 64   Temp 97.7 F (36.5 C) (Oral)   Ht 5\' 2"  (  1.575 m)   Wt 150 lb 6.4 oz (68.2 kg)   SpO2 99%   BMI 27.51 kg/m  BP Readings from Last 3 Encounters:  08/01/21 92/64  12/27/20 124/68  12/06/20 120/78   Wt Readings from Last 3 Encounters:  08/01/21 150 lb 6.4 oz (68.2 kg)  12/27/20 144 lb 6.4 oz (65.5 kg)  12/06/20 143 lb (64.9 kg)    Physical Exam Vitals reviewed.  Constitutional:      Appearance: Normal appearance. She is well-developed.  Eyes:     Conjunctiva/sclera: Conjunctivae normal.  Cardiovascular:     Rate and Rhythm: Normal rate and regular rhythm.     Pulses: Normal pulses.     Heart sounds: Normal heart sounds.  Pulmonary:     Effort: Pulmonary effort is normal.     Breath  sounds: Normal breath sounds. No wheezing, rhonchi or rales.  Abdominal:     General: Bowel sounds are normal. There is no distension.     Palpations: Abdomen is soft. Abdomen is not rigid. There is no fluid wave or mass.     Tenderness: There is no abdominal tenderness. There is no guarding or rebound.  Skin:    General: Skin is warm and dry.  Neurological:     Mental Status: She is alert.  Psychiatric:        Speech: Speech normal.        Behavior: Behavior normal.        Thought Content: Thought content normal.       Assessment & Plan:   Problem List Items Addressed This Visit       Digestive   Gastritis - Primary    Reviewed emergency room visit with patient.  Abdominal pain completely resolved.  She is no longer drinking alcohol.  Advised to continue Protonix 40 mg daily for a 6 more weeks and then she may wean off medication.  GI referral in place to ensure complete resolution of symptoms and no further evaluation required      Relevant Medications   pantoprazole (PROTONIX) 40 MG tablet   Other Relevant Orders   Ambulatory referral to Gastroenterology     Other   Bipolar 2 disorder (HCC)    Stable.  History of opioid use disorder and last time she used opioids approximately 1 1/2 months ago.  Advised that she establish with psychiatry as well as to local resources including Alcoholics anonymous, Narcotics Anonymous provide ongoing support to prevent relapse.  We will continue BuSpar as needed for anxiety.close follow up      Relevant Orders   Ambulatory referral to Psychiatry   Tobacco use disorder    She vapes tobacco products all day as described by patient.  Provided nicotine patches and also a referral to Pharm.D., Connie Fernandez in our office for further assistance, counseling for smoking cessation.  Counseled on the risk of vaping as it relates to lung cancer, lung infections.      Relevant Medications   nicotine (NICODERM CQ - DOSED IN MG/24 HOURS) 21 mg/24hr  patch   Other Relevant Orders   AMB Referral to Community Care Coordinaton     I have changed Connie Fernandez's pantoprazole. I am also having her start on nicotine. Additionally, I am having her maintain her busPIRone.   Meds ordered this encounter  Medications   pantoprazole (PROTONIX) 40 MG tablet    Sig: Take 1 tablet (40 mg total) by mouth every morning. Take to 1 hr before first meal  of day    Dispense:  60 tablet    Refill:  0    Order Specific Question:   Supervising Provider    Answer:   Duncan Dull L [[id number]]   nicotine (NICODERM CQ - DOSED IN MG/24 HOURS) 21 mg/24hr patch    Sig: Place 1 patch (21 mg total) onto the skin daily.    Dispense:  21 patch    Refill:  5    Order Specific Question:   Supervising Provider    Answer:   Sherlene Shams [[id number]]    Return precautions given.   Risks, benefits, and alternatives of the medications and treatment plan prescribed today were discussed, and patient expressed understanding.   Education regarding symptom management and diagnosis given to patient on AVS.  Continue to follow with Allegra Grana, FNP for routine health maintenance.   Wyatt Portela and I agreed with plan.   Rennie Plowman, FNP

## 2021-08-01 NOTE — Patient Instructions (Addendum)
Start nicotine patch Continue Protonix.  After 6 weeks you may take this medication every other day for 1 week and then you may stop this medication.  Please abstain from alcohol, anti-inflammatory such as Aleve, ibuprofen, Motrin these contribute to gastritis, gastric ulcer as discussed.  Referral to psychiatry, gastroenterology and pharmacy Vanice Sarah) Let us know if you dont hear back within a week in regards to an appointment being scheduled.   How to Use Nicotine Patches  Nicotine Patch icon For best results, make sure you start on the right dose. The nicotine patch comes in three strengths (7 mg, 14 mg, 21 mg). The right dose for you depends on how much you currently smoke. If you smoke more than 10 cigarettes per day, consider starting on the 21 mg patch. Don't wear two patches at once unless directed to do so by your healthcare provider. Over time (typically after 8 to 12 weeks), you should lower the dose with the goal of stopping use of the patch completely. The nicotine patch is typically worn for 24 hours. The patch can even be worn when showering or bathing. When you wake up, put a fresh patch on clean skin and wear it for a full 24 hours. If you find that you are having vivid dreams or that your sleep is disturbed, you can take the patch off before bed and put a new one on the next morning. Put the patch on clean, dry, hair-free skin on the upper body. Usual places to put the patch are the upper chest, upper arm, shoulder, back, or inner arm. Avoid putting the patch on areas of irritated, oily, scarred, or damaged skin. Remove the patch from the foil package, peel off the protective strips, and immediately apply the patch to your skin. Press down to ensure the patch sticks to your skin. Wash your hands with soap and water after you apply the patch to wash away any nicotine you may have gotten on your fingers when applying the patch. To avoid skin irritation, put the patch on a  different area of your upper body each day. Avoid wearing the patch in the same place more than once per week. If the patch loosens or falls off, replace it with a new one. When changing your patch, remove the patch carefully and dispose of it by folding it in half with the sticky sides touching. Then apply a new patch to a different part of your upper body. Keep out of reach of children and pets. Nicotine patches - even used patches - may have enough nicotine to make children and pets sick. In case of accidental use or ingestion, contact a Poison Control Center right away [phone]). Learn more about nicotine patches, including side effects and precautions.  How to Use a Nicotine Patch to Quit Smoking  Audio Description Low Resolution Video The nicotine patch is an FDA-approved medicine that can help people quit smoking. It can be used daily by itself to control withdrawal symptoms, or it may be used with nicotine gum or lozenge which are taken as needed for strong cravings. This video offers step-by-step instructions on how to use the nicotine patch by itself. This video is part of the SmokefreeVET partnership between the Department of [employer] and the [employer].gov Initiative. For more help using medicines to quit smoking, call 1-800-QUIT-NOW or visit WithoutCable.hu. Talk with your healthcare provider about the best medicine for you. Using medicine together with behavioral counseling gives you the best  chance of quitting smoking.  Quick patch tips    Combine the patch with nicotine gum or lozenge to better manage cravings. You can start using both the patch and gum or lozenge, or you can add gum or lozenge later, if you continue to have withdrawal symptoms. Patches can provide a steady level of nicotine in the body to help lessen withdrawal, while the gum or lozenge can be used to more quickly relieve cravings as they happen. If you have a lot of cravings  while using the patch, you may not be using a strong enough dose. Consider stepping up to a higher dose. If you are already on the highest dose, talk with your doctor or other healthcare provider for help with dosing. You can also consider adding gum or lozenge, as described above. What if I slip up and smoke while using the patch? You do not need to stop using the patch if you slip up and smoke while wearing it. Throw away your cigarettes and get back on track with your quit attempt. Keep using the patch as directed above. For best results, use the nicotine patch as part of a program that includes coaching support. Talk with your healthcare provider and connect with your state tobacco quitline (1-800-QUIT-NOW) for help.

## 2021-08-02 NOTE — Assessment & Plan Note (Addendum)
Stable.  History of opioid use disorder and last time she used opioids approximately 1 1/2 months ago.  Advised that she establish with psychiatry as well as to local resources including Alcoholics anonymous, Narcotics Anonymous provide ongoing support to prevent relapse.  We will continue BuSpar as needed for anxiety.close follow up

## 2021-08-02 NOTE — Assessment & Plan Note (Signed)
She vapes tobacco products all day as described by patient.  Provided nicotine patches and also a referral to Pharm.D., Catie Feliz Beam in our office for further assistance, counseling for smoking cessation.  Counseled on the risk of vaping as it relates to lung cancer, lung infections.

## 2021-08-02 NOTE — Assessment & Plan Note (Addendum)
Reviewed emergency room visit with patient.  Abdominal pain completely resolved.  She is no longer drinking alcohol.  Advised to continue Protonix 40 mg daily for a 6 more weeks and then she may wean off medication.  GI referral in place to ensure complete resolution of symptoms and no further evaluation required

## 2021-08-07 NOTE — Chronic Care Management (AMB) (Signed)
  Care Management   Outreach Note  08/07/2021 Name: Nehemiah Montee MRN: [id number] DOB: 02/08/1996  Referred by: Allegra Grana, FNP Reason for referral : Care Coordination (Outreach to schedule referral with Pharm D )   A second unsuccessful telephone outreach was attempted today. The patient was referred to the case management team for assistance with care management and care coordination.   Follow Up Plan:  A HIPAA compliant phone message was left for the patient providing contact information and requesting a return call.  The care management team will reach out to the patient again over the next 7 days.  If patient returns call to provider office, please advise to call Embedded Care Management Care Guide Penne Lash at [phone]  Penne Lash, RMA Care Guide, Embedded Care Coordination [facility]  [place], [place] [address] Direct Dial: [phone] [email or web address]: [email or web address]

## 2021-08-18 NOTE — Chronic Care Management (AMB) (Signed)
  Care Management   Outreach Note  08/18/2021 Name: Connie Fernandez MRN: [id number] DOB: 1995-11-09  Referred by: Allegra Grana, FNP Reason for referral : Care Coordination (Outreach to schedule referral with Pharm D )   Third unsuccessful telephone outreach was attempted today. The patient was referred to the case management team for assistance with care management and care coordination. The patient's primary care provider has been notified of our unsuccessful attempts to make or maintain contact with the patient. The care management team is pleased to engage with this patient at any time in the future should he/she be interested in assistance from the care management team.   Follow Up Plan:  We have been unable to make contact with the patient. The care management team is available to follow up with the patient after provider conversation with the patient regarding recommendation for care management engagement and subsequent re-referral to the care management team.   Penne Lash, RMA Care Guide, Embedded Care Coordination [facility]  [place], [place] [address] Direct Dial: [phone] [email or web address]: [email or web address]

## 2021-08-29 ENCOUNTER — Ambulatory Visit: Payer: BC Managed Care – PPO | Admitting: Family

## 2021-09-01 ENCOUNTER — Telehealth: Payer: Self-pay | Admitting: Family

## 2021-09-01 NOTE — Telephone Encounter (Signed)
Pt called me back stating she has already been to [facility].   Pt states she needs a higher dose on her busPIRone (BUSPAR) 5 MG tablet  Please advise and Thank you!  Call pt @ [phone].

## 2021-09-01 NOTE — Telephone Encounter (Signed)
Lft pt a vm to call ofc to give her information to call [facility] to sch appt. thanks

## 2021-09-06 ENCOUNTER — Telehealth: Payer: Self-pay

## 2021-09-06 DIAGNOSIS — F419 Anxiety disorder, unspecified: Secondary | ICD-10-CM

## 2021-09-06 MED ORDER — BUSPIRONE HCL 5 MG PO TABS: ORAL_TABLET | ORAL | 1 refills | Status: DC

## 2021-09-06 NOTE — Telephone Encounter (Signed)
Call pt If she is using opioids ,please [facility]   If she has been taking BuSpar 5 mg 3 times daily, she may increase by a total of 5 mg.  She may increase one of her daily doses from 5 to 10 mg to total 20 mg of BuSpar daily.  Please ensure she has follow-up arranged with me as well  Please change rx as well

## 2021-09-06 NOTE — Telephone Encounter (Signed)
LM for patient to call back. I have changed the script in chart, but I have not sent in yet until I speak with patient. Also she needs f/u appointment.

## 2021-09-07 ENCOUNTER — Other Ambulatory Visit: Payer: Self-pay

## 2021-09-07 DIAGNOSIS — F419 Anxiety disorder, unspecified: Secondary | ICD-10-CM

## 2021-09-07 MED ORDER — BUSPIRONE HCL 5 MG PO TABS: ORAL_TABLET | ORAL | 1 refills | Status: DC

## 2021-09-07 NOTE — Telephone Encounter (Signed)
I have spoken with patient & she was agreeable to daily dose of 20 mg in total. I have sent for her to take 10 mg by mouth once a day then the 5 mg by mouth twice a day.

## 2021-09-07 NOTE — Telephone Encounter (Signed)
Patient called about medication, busPIRone (BUSPAR) 5 MG tablet. Patient stated that she needs a higher dose for something else for her anxiety.

## 2021-09-07 NOTE — Telephone Encounter (Signed)
See prior note. I have called & responded to patient.

## 2021-10-02 ENCOUNTER — Encounter: Payer: Self-pay | Admitting: Gastroenterology

## 2021-10-02 ENCOUNTER — Ambulatory Visit (INDEPENDENT_AMBULATORY_CARE_PROVIDER_SITE_OTHER): Payer: BC Managed Care – PPO | Admitting: Gastroenterology

## 2021-10-02 VITALS — BP 115/74 | HR 72 | Temp 98.1°F | Ht 62.0 in | Wt 151.8 lb

## 2021-10-02 DIAGNOSIS — Z8719 Personal history of other diseases of the digestive system: Secondary | ICD-10-CM

## 2021-10-02 NOTE — Progress Notes (Signed)
Wyline Mood MD, MRCP(U.K) [address]  [address]  [place], [place] [address]  Main: [phone]  Fax: [phone]   Gastroenterology Consultation  Referring Provider:     Allegra Grana, FNP Primary Care Physician:  Allegra Grana, FNP Primary Gastroenterologist:  Dr. Wyline Mood  Reason for Consultation:    Gastritis        HPI:   Connie Fernandez is a 25 y.o. y/o female referred for consultation & management  by Allegra Grana, FNP.  For gastritis.  No recent abdominal imaging. 07/20/2021: Hemoglobin 12.9 g, lipase normal, CMP shows no gross abnormalities,  Seen back in 2016 by [facility] gastroenterology for abdominal pain x-ray at that time showed stool throughout the colon suggestive of constipation.  She says that a few months back she had a large quantity of Bacardi rum and diluted on 1 occasion following which she had abdominal discomfort and started throwing up.  Initially when she threw up it was nonbloody but subsequently was bloody.  She had a few days of black-colored stools and all of this resolved subsequently she has stopped drinking alcohol.  No issues presently.  Normal brown color stools no abdominal pain.  No other complaints presently.  Past Medical History:  Diagnosis Date   Anemia    Anxiety    Depression    Dysmenorrhea in adolescent    Endometriosis    Headache    Migraines   Heart palpitations    Kidney stone    Shortness of breath dyspnea    with exertion   UTI (lower urinary tract infection)     Past Surgical History:  Procedure Laterality Date   LAPAROSCOPY N/A 06/12/2016   Procedure: LAPAROSCOPY DIAGNOSTIC;  Surgeon: Allie Bossier, MD;  Location: [facility];  Service: Gynecology;  Laterality: N/A;    Prior to Admission medications   Medication Sig Start Date End Date Taking? Authorizing Provider  busPIRone (BUSPAR) 5 MG tablet Take 10 mg by mouth one time daily & 5 mg by mouth twice daily. 09/07/21   Allegra Grana,  FNP  nicotine (NICODERM CQ - DOSED IN MG/24 HOURS) 21 mg/24hr patch Place 1 patch (21 mg total) onto the skin daily. 08/01/21   Allegra Grana, FNP  pantoprazole (PROTONIX) 40 MG tablet Take 1 tablet (40 mg total) by mouth every morning. Take to 1 hr before first meal of day 08/01/21 08/31/21  Allegra Grana, FNP    Family History  Problem Relation Age of Onset   Diabetes Mother    Alcohol abuse Father    Hypertension Father    COPD Maternal Grandmother    Early death Maternal Grandmother 24   Heart disease Maternal Grandmother    Heart disease Maternal Grandfather 49   Parkinsonism Paternal Grandmother      Social History   Tobacco Use   Smoking status: Every Day    Packs/day: 0.00    Years: 1.00    Pack years: 0.00    Types: E-cigarettes, Cigarettes   Smokeless tobacco: Never  Vaping Use   Vaping Use: Every day  Substance Use Topics   Alcohol use: Yes    Alcohol/week: 0.0 standard drinks    Comment: few drinks per month.    Drug use: No    Allergies as of 10/02/2021 - Review Complete 10/02/2021  Allergen Reaction Noted   Latex Itching 07/02/2016   Penicillins Hives 10/26/2015    Review of Systems:    All  systems reviewed and negative except where noted in HPI.   Physical Exam:  BP 115/74   Pulse 72   Temp 98.1 F (36.7 C) (Oral)   Ht 5\' 2"  (1.575 m)   Wt 151 lb 12.8 oz (68.9 kg)   BMI 27.76 kg/m  No LMP recorded. Psych:  Alert and cooperative. Normal mood and affect. General:   Alert,  Well-developed, well-nourished, pleasant and cooperative in NAD Abdomen:  Normal bowel sounds.  No bruits.  Soft, non-tender and non-distended without masses, hepatosplenomegaly or hernias noted.  No guarding or rebound tenderness.    Msk:  Symmetrical without gross deformities. Good, equal movement & strength bilaterally. Lymph Nodes:  No significant cervical adenopathy. Psych:  Alert and cooperative. Normal mood and affect.  Imaging Studies: No results  found.  Assessment and Plan:   Connie Fernandez is a 25 y.o. y/o female is here today to see me for an episode of hematemesis which occurred after a bout of heavy alcohol consumption of half a bottle of rum which was not diluted.  She had initially nonbloody emesis followed by bloody emesis which resolved subsequently.  She has since not had a repeat episode and has no other GI issues at this point of time.  Recent hemoglobin checked after this episode was normal.  She has brown stools presently.  Her history is very suggestive of a Mallory-Weiss tear and this usually heals without no further sequelae.  I explained to her that if the issue persists or recurs and she has black-colored stools or any other GI issue to call my office to see me ASAP.  She is aware that she should not drink large quantities of alcohol in the future  Follow up in as needed  Dr 22 MD,MRCP(U.K)

## 2021-10-17 ENCOUNTER — Encounter: Payer: Self-pay | Admitting: Family

## 2021-10-17 ENCOUNTER — Telehealth: Payer: Self-pay | Admitting: Family

## 2021-10-17 ENCOUNTER — Ambulatory Visit (INDEPENDENT_AMBULATORY_CARE_PROVIDER_SITE_OTHER): Payer: BC Managed Care – PPO | Admitting: Family

## 2021-10-17 ENCOUNTER — Other Ambulatory Visit: Payer: Self-pay

## 2021-10-17 DIAGNOSIS — F3181 Bipolar II disorder: Secondary | ICD-10-CM

## 2021-10-17 DIAGNOSIS — F419 Anxiety disorder, unspecified: Secondary | ICD-10-CM

## 2021-10-17 NOTE — Assessment & Plan Note (Addendum)
Uncontrolled and decompensated. Recent events with partner , eviction notice, poor sleep. Use of kratom. No opioid use at this time.  I am very concerned as it relates to patient safety as she has told her mother that she has thought regarding harming herself. When asked in the office, she states that she 'would never hurt self' and 'wishes that she hadnt said that'. She lives for her mother and is worried about her job if hospitalized.  I have left message with [facility] with nursing line to call me regarding first available appointment.  Expressed my deep concern for medical emergency and that she would need higher level of care and observation for her safety. She was hesitant at first but as we discussed , she understand and expressed willingness to go with her mother today to [facility] ED.  She declines returning to [facility] ED due to past experience. She was agreeable to being seen at [facility] ED and her mother whom is driving today firmly understood the importance of driving her straight to [facility]. I anticipate admission and working on scheduling an appointment with psychiatry urgently for continued treatment. Will follow closely.

## 2021-10-17 NOTE — Telephone Encounter (Signed)
Unable to reach [facility]. Pt is currently at [facility] ED

## 2021-10-17 NOTE — Telephone Encounter (Signed)
Call   [facility] [phone]   Pt seen by dr Evelene Croon , brittney parsons 03/2020  She was seen today and concern for suicidally , not sleeping, paranoia.  H/o bipolar Advised that she goes to [facility] ED for admission, she was agreeable and mother driving her She declines [facility] ed due to past experience  How soon can pt re establish with their office?

## 2021-10-17 NOTE — Patient Instructions (Signed)
Please go immediately and straight to emergency room as discussed today at main [facility] for behavioral
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
## 2021-10-18 ENCOUNTER — Telehealth: Payer: Self-pay

## 2021-10-18 ENCOUNTER — Other Ambulatory Visit (INDEPENDENT_AMBULATORY_CARE_PROVIDER_SITE_OTHER): Payer: BC Managed Care – PPO

## 2021-10-18 ENCOUNTER — Encounter: Payer: Self-pay | Admitting: Family

## 2021-10-18 ENCOUNTER — Other Ambulatory Visit: Payer: Self-pay

## 2021-10-18 ENCOUNTER — Telehealth: Payer: Self-pay | Admitting: Family

## 2021-10-18 DIAGNOSIS — R300 Dysuria: Secondary | ICD-10-CM

## 2021-10-18 MED ORDER — BUSPIRONE HCL 7.5 MG PO TABS: 7.5000 mg | ORAL_TABLET | Freq: Three times a day (TID) | ORAL | 0 refills | Status: DC

## 2021-10-18 NOTE — Telephone Encounter (Signed)
Called and spoke with patient today  She was seen in [facility] ed by psychiatrist she reports. No report in care everywhere  No medication started  She denies si/hi. She has no suicide plan.  She is home this morning and her partner is with her.   She has been taking buspar intermittently with little relief.   Complains of dysuria x 1 day.  No fever, flank pain.   Plan:  Scheduled appt 10/20/21 at [facility] in [place] as prior [facility] Dr Evelene Croon no Doyne Keel could accept her as patient.  [facility] dual [facility] and OV notes faxed by rasheedah Patient aware of address and time of appointment Increase buspar to 7.5mg  tid She will drop off urine today

## 2021-10-18 NOTE — Telephone Encounter (Signed)
Letter completed for patient

## 2021-10-19 ENCOUNTER — Telehealth: Payer: Self-pay | Admitting: Family

## 2021-10-19 LAB — URINALYSIS, ROUTINE W REFLEX MICROSCOPIC
Bilirubin Urine: NEGATIVE
Hgb urine dipstick: NEGATIVE
Nitrite: NEGATIVE
RBC / HPF: NONE SEEN (ref 0–?)
Specific Gravity, Urine: 1.02 (ref 1.000–1.030)
Total Protein, Urine: 30 — AB
Urine Glucose: NEGATIVE
Urobilinogen, UA: 1 (ref 0.0–1.0)
pH: 8 (ref 5.0–8.0)

## 2021-10-19 LAB — URINE CULTURE
MICRO NUMBER:: 12756624
Result:: NO GROWTH
SPECIMEN QUALITY:: ADEQUATE

## 2021-10-19 NOTE — Telephone Encounter (Signed)
(  I believe it was the mother speaking on behave of the daughter) Pt called in stating that she received her results. Pt stated that she have a UTI and she is requesting for medication for UTI. Pt requesting for someone to read her result to her because she doesn't know what everything means. Pt requesting callback

## 2021-10-20 ENCOUNTER — Other Ambulatory Visit: Payer: BC Managed Care – PPO

## 2021-10-20 ENCOUNTER — Other Ambulatory Visit: Payer: Self-pay | Admitting: Family

## 2021-10-20 DIAGNOSIS — R8290 Unspecified abnormal findings in urine: Secondary | ICD-10-CM

## 2021-10-20 NOTE — Addendum Note (Signed)
Addended by: Will Heinkel S on: 10/20/2021 02:25 PM ° ° Modules accepted: Orders ° °

## 2021-10-20 NOTE — Telephone Encounter (Signed)
Pt calling again. Pt stating that when she urinate that it still burns. Pt is requesting medication. Pt requesting callback

## 2021-10-20 NOTE — Addendum Note (Signed)
Addended by: Makaylie Dedeaux S on: 10/20/2021 02:25 PM ° ° Modules accepted: Orders ° °

## 2021-10-20 NOTE — Telephone Encounter (Signed)
I called and spoke with patient, She did want to come in & leave urine today. I have scheduled. I advised that if pain during urination that she could try some relief with AZO until we got results bad. If UTI worsened that she needed to go to [facility] for treatment not OTC. Pt verbalized understanding of this.

## 2021-11-17 ENCOUNTER — Ambulatory Visit: Payer: BC Managed Care – PPO | Admitting: Family

## 2021-12-28 ENCOUNTER — Telehealth (INDEPENDENT_AMBULATORY_CARE_PROVIDER_SITE_OTHER): Payer: BC Managed Care – PPO | Admitting: Family Medicine

## 2021-12-28 ENCOUNTER — Other Ambulatory Visit: Payer: Self-pay

## 2021-12-28 DIAGNOSIS — R0981 Nasal congestion: Secondary | ICD-10-CM | POA: Diagnosis not present

## 2021-12-28 DIAGNOSIS — J029 Acute pharyngitis, unspecified: Secondary | ICD-10-CM | POA: Diagnosis not present

## 2021-12-28 DIAGNOSIS — R059 Cough, unspecified: Secondary | ICD-10-CM

## 2021-12-28 MED ORDER — BENZONATATE 100 MG PO CAPS: ORAL_CAPSULE | ORAL | 0 refills | Status: DC

## 2021-12-28 NOTE — Patient Instructions (Signed)
° ° ° °--------------------------------------------------------------------------------------------------------------------------- ° ° ° °  WORK SLIP:  Patient Connie Fernandez,  03-28-1996, was seen for a medical visit today, 12/28/21 . Please excuse from work for a COVID/flu like illness until has completed covid testing and until feeling better. Advise following CDC guidelines for testing - at least 2 tests 48 hours apart.  If Covid19 testing is positive, please be advised that the patient will likely be contagious for 7- 10 days on average from the onset of symptoms (12/26/21). Will defer to employer for a sooner return to work if patient has 2 negative covid tests 48 hours apart and is feeling better, or if symptoms have resolved, it is greater than 5 days since the positive test and the patient can wear a high-quality, tight fitting mask such as N95 or KN95 at all times for an additional 5 days.   Sincerely: E-signature: Dr. Kriste Basque, DO [facility] [place] Ph: [phone]   ------------------------------------------------------------------------------------------------------------------------------      HOME CARE TIPS:  -COVID19 testing information: [email or web address]   -I sent the medication(s) we discussed to your pharmacy: Meds ordered this encounter  Medications   benzonatate (TESSALON PERLES) 100 MG capsule    Sig: 1-2 capsules up to twice daily as needed for cough    Dispense:  30 capsule    Refill:  0    -warm salt water gargles twice daily for 3 days  -can use tylenol or aleve if needed for fevers, aches and pains per instructions  -can use nasal saline twice daily  -stay hydrated, drink plenty of fluids and eat small healthy meals - avoid dairy  -can take 1000 IU ( ) Vit D3 and 100-500 mg of Vit C daily per instructions  -If the Covid test is positive, check out the CDC website for more information on home care,  transmission and treatment for COVID19  -follow up with your doctor in 2-3 days unless improving and feeling better  -stay home while sick, except to seek medical care. If you have COVID19, you will likely be contagious for 7-10 days. Flu or Influenza is likely contagious for about 7 days. Other respiratory viral infections remain contagious for 5-10+ days depending on the virus and many other factors. Wear a good mask that fits snugly (such as N95 or KN95) if around others to reduce the risk of transmission.  It was nice to meet you today, and I really hope you are feeling better soon. I help [place] out with telemedicine visits on Tuesdays and Thursdays and am happy to help if you need a follow up virtual visit on those days. Otherwise, if you have any concerns or questions following this visit please schedule a follow up visit with your Primary Care doctor or seek care at a local urgent care clinic to avoid delays in care.    Seek in person care or schedule a follow up video visit promptly if your symptoms worsen, new concerns arise or you are not improving with treatment. Call 911 and/or seek emergency care if your symptoms are severe or life threatening.

## 2021-12-28 NOTE — Progress Notes (Signed)
Virtual Visit via Video Note  I connected with Connie Fernandez  on 12/28/21 at  3:00 PM EST by a video enabled telemedicine application and verified that I am speaking with the correct person using two identifiers.  Location patient: [place] Location provider:work or home office Persons participating in the virtual visit: patient, provider  I discussed the limitations and requested verbal permission for telemedicine visit. The patient expressed understanding and agreed to proceed.   HPI:  Acute telemedicine visit for sinus congestion, sore throat: -Onset: 2 days ago -Symptoms include:sore throat, headache, sneezing, watery eyes, cough, pnd, nasal congestion -Denies:fever, body aches, NVD, CP, SOB -drinking plenty of fluids -Pertinent past medical history: see below -denies pregnancy, reports FDLMP 3 weeks -Pertinent medication allergies: Allergies  Allergen Reactions   Latex Itching   Penicillins Hives    Has patient had a PCN reaction causing immediate rash, facial/tongue/throat swelling, SOB or lightheadedness with hypotension: Dizziness and shaky. Has patient had a PCN reaction causing severe rash involving mucus membranes or skin necrosis: NO Has patient had a PCN reaction that required hospitalization: NO Has patient had a PCN reaction occurring within the last 10 years: NO If all of the above answers are "NO", then may proceed with Cephalosporin use.   -COVID-19 vaccine status: no covid vaccines Immunization History  Administered Date(s) Administered   Influenza,inj,Quad PF,6+ Mos 08/05/2017   Tdap 01/21/2011     ROS: See pertinent positives and negatives per HPI.  Past Medical History:  Diagnosis Date   Anemia    Anxiety    Depression    Dysmenorrhea in adolescent    Endometriosis    Headache    Migraines   Heart palpitations    Kidney stone    Shortness of breath dyspnea    with exertion   UTI (lower urinary tract infection)     Past Surgical History:  Procedure  Laterality Date   LAPAROSCOPY N/A 06/12/2016   Procedure: LAPAROSCOPY DIAGNOSTIC;  Surgeon: Allie Bossier, MD;  Location: [facility];  Service: Gynecology;  Laterality: N/A;     Current Outpatient Medications:    benzonatate (TESSALON PERLES) 100 MG capsule, 1-2 capsules up to twice daily as needed for cough, Disp: 30 capsule, Rfl: 0   busPIRone (BUSPAR) 7.5 MG tablet, Take 1 tablet (7.5 mg total) by mouth 3 (three) times daily., Disp: 90 tablet, Rfl: 0   nicotine (NICODERM CQ - DOSED IN MG/24 HOURS) 21 mg/24hr patch, Place 1 patch (21 mg total) onto the skin daily. (Patient not taking: Reported on 10/17/2021), Disp: 21 patch, Rfl: 5   pantoprazole (PROTONIX) 40 MG tablet, Take 1 tablet (40 mg total) by mouth every morning. Take to 1 hr before first meal of day (Patient taking differently: Take 40 mg by mouth as needed. Take to 1 hr before first meal of day), Disp: 60 tablet, Rfl: 0  EXAM:  VITALS per patient if applicable:  GENERAL: alert, oriented, appears well and in no acute distress  HEENT: atraumatic, conjunttiva clear, no obvious abnormalities on inspection of external nose and ears, moist mucus membranes, mild erythema of the post oropharynx w/out tonsillar edema or exudate  NECK: normal movements of the head and neck  LUNGS: on inspection no signs of respiratory distress, breathing rate appears normal, no obvious gross SOB, gasping or wheezing  CV: no obvious cyanosis  MS: moves all visible extremities without noticeable abnormality  PSYCH/NEURO: pleasant and cooperative, no obvious depression or anxiety, speech and thought processing grossly intact  ASSESSMENT AND PLAN:  Discussed the following assessment and plan:  Nasal congestion  Cough, unspecified type  Sore throat  -we discussed possible serious and likely etiologies, options for evaluation and workup, limitations of telemedicine visit vs in person visit, treatment, treatment risks and precautions. Pt is  agreeable to treatment via telemedicine at this moment. Query VURI, [id number], allergies vs other. She agrees to covid test. Advised could try starting allegra or zyrtec once daily, nasal saline sinus rinses, salt water gargles, Aleve bid and Tessalon rx sent for cough.  Work/School slipped offered: provided in patient instructions   Advised to seek prompt virtual visit or in person care if worsening, new symptoms arise, or if is not improving with treatment as expected per our conversation of expected course. I discussed the assessment and treatment plan with the patient. The patient was provided an opportunity to ask questions and all were answered. The patient agreed with the plan and demonstrated an understanding of the instructions.     Terressa Koyanagi, DO

## 2022-01-23 ENCOUNTER — Encounter: Payer: Self-pay | Admitting: Obstetrics & Gynecology

## 2022-01-23 ENCOUNTER — Ambulatory Visit (INDEPENDENT_AMBULATORY_CARE_PROVIDER_SITE_OTHER): Payer: BC Managed Care – PPO | Admitting: Obstetrics & Gynecology

## 2022-01-23 ENCOUNTER — Other Ambulatory Visit: Payer: Self-pay

## 2022-01-23 ENCOUNTER — Other Ambulatory Visit (HOSPITAL_COMMUNITY)
Admission: RE | Admit: 2022-01-23 | Discharge: 2022-01-23 | Disposition: A | Discharge status: Home / Self Care | Payer: BC Managed Care – PPO | Source: Ambulatory Visit | Attending: Obstetrics & Gynecology | Admitting: Obstetrics & Gynecology

## 2022-01-23 VITALS — BP 116/77 | HR 68 | Ht 62.0 in | Wt 161.2 lb

## 2022-01-23 DIAGNOSIS — Z01419 Encounter for gynecological examination (general) (routine) without abnormal findings: Secondary | ICD-10-CM | POA: Diagnosis present

## 2022-01-23 DIAGNOSIS — Z793 Long term (current) use of hormonal contraceptives: Secondary | ICD-10-CM

## 2022-01-23 DIAGNOSIS — N946 Dysmenorrhea, unspecified: Secondary | ICD-10-CM

## 2022-01-23 DIAGNOSIS — Z113 Encounter for screening for infections with a predominantly sexual mode of transmission: Secondary | ICD-10-CM

## 2022-01-23 MED ORDER — NEXTSTELLIS 3-14.2 MG PO TABS: 1.0000 | ORAL_TABLET | Freq: Every day | ORAL | 2 refills | Status: DC

## 2022-01-23 NOTE — Progress Notes (Signed)
? ? ?GYNECOLOGY ANNUAL PREVENTATIVE CARE ENCOUNTER NOTE ? ?History:    ? Connie Fernandez is a 26 y.o. G0P0000 female here for a routine annual gynecologic exam.  Current complaints: continued chronic dysmenorrhea, wants to restart OCPs.  Also wants evaluation to see why she has not conceived as she has been having unprotected intercourse, she fears something is wrong with her organs. Accompanied by her mother.   Denies abnormal vaginal bleeding, discharge, pelvic pain outside menses, problems with intercourse or other gynecologic concerns.  ?  ?Gynecologic History ?Patient's last menstrual period was 01/04/2022 (exact date). ?Contraception: none ?Last Pap: 08/13/2019. Result was normal  ? ?Obstetric History ?OB History  ?Gravida Para Term Preterm AB Living  ?0 0 0 0 0 0  ?SAB IAB Ectopic Multiple Live Births  ?0 0 0 0 0  ? ? ?Past Medical History:  ?Diagnosis Date  ? Anemia   ? Anxiety   ? Chronic pelvic pain in female   ? Had negative laparoscopy for endometriosis in 2017  ? Depression   ? Dysmenorrhea in adolescent   ? Headache   ? Migraines  ? Heart palpitations   ? Kidney stone   ? Shortness of breath dyspnea   ? with exertion  ? UTI (lower urinary tract infection)   ? ? ?Past Surgical History:  ?Procedure Laterality Date  ? LAPAROSCOPY N/A 06/12/2016  ? Procedure: LAPAROSCOPY DIAGNOSTIC;  Surgeon: Allie Bossier, MD;  Location: [facility];  Service: Gynecology;  Laterality: N/A;  ? ? ?Current Outpatient Medications on File Prior to Visit  ?Medication Sig Dispense Refill  ? gabapentin (NEURONTIN) 600 MG tablet Take 600 mg by mouth 4 (four) times daily.    ? prazosin (MINIPRESS) 2 MG capsule Take by mouth at bedtime.    ? ?No current facility-administered medications on file prior to visit.  ? ? ?Allergies  ?Allergen Reactions  ? Latex Itching  ? Penicillins Hives  ?  Has patient had a PCN reaction causing immediate rash, facial/tongue/throat swelling, SOB or lightheadedness with hypotension: Dizziness and shaky. ?Has  patient had a PCN reaction causing severe rash involving mucus membranes or skin necrosis: NO ?Has patient had a PCN reaction that required hospitalization: NO ?Has patient had a PCN reaction occurring within the last 10 years: NO ?If all of the above answers are "NO", then may proceed with Cephalosporin use. ?  ? ? ?Social History:  reports that she has been smoking e-cigarettes and cigarettes. She has never used smokeless tobacco. She reports that she does not currently use alcohol. She reports that she does not use drugs. ? ?Family History  ?Problem Relation Age of Onset  ? Diabetes Mother   ? Alcohol abuse Father   ? Hypertension Father   ? COPD Maternal Grandmother   ? Early death Maternal Grandmother 31  ? Heart disease Maternal Grandmother   ? Heart disease Maternal Grandfather 63  ? Parkinsonism Paternal Grandmother   ? ? ?The following portions of the patient's history were reviewed and updated as appropriate: allergies, current medications, past family history, past medical history, past social history, past surgical history and problem list. ? ?Review of Systems ?Pertinent items noted in HPI and remainder of comprehensive ROS otherwise negative. ? ?Physical Exam:  ?BP 116/77 (BP Location: Left Arm, Patient Position: Sitting, Cuff Size: Normal)   Pulse 68   Ht 5\' 2"  (1.575 m)   Wt 161 lb 3.2 oz (73.1 kg)   LMP 01/04/2022 (Exact Date)   BMI 29.48  kg/m?  ?CONSTITUTIONAL: Well-developed, well-nourished female in no acute distress.  ?HENT:  Normocephalic, atraumatic, External right and left ear normal.  ?EYES: Conjunctivae and EOM are normal. Pupils are equal, round, and reactive to light. No scleral icterus.  ?NECK: Normal range of motion, supple, no masses.  Normal thyroid.  ?SKIN: Skin is warm and dry. No rash noted. Not diaphoretic. No erythema. No pallor. ?MUSCULOSKELETAL: Normal range of motion. No tenderness.  No cyanosis, clubbing, or edema. ?NEUROLOGIC: Alert and oriented to person, place, and  time. Normal reflexes, muscle tone coordination.  ?PSYCHIATRIC: Normal mood and affect. Normal behavior. Normal judgment and thought content. ?CARDIOVASCULAR: Normal heart rate noted, regular rhythm ?RESPIRATORY: Clear to auscultation bilaterally. Effort and breath sounds normal, no problems with respiration noted. ?BREASTS: Symmetric in size. No masses, tenderness, skin changes, nipple drainage, or lymphadenopathy bilaterally. Performed in the presence of a chaperone. ?ABDOMEN: Soft, no distention noted.  No tenderness, rebound or guarding.  ?PELVIC: Normal appearing external genitalia and urethral meatus; normal appearing vaginal mucosa and cervix.  No abnormal vaginal discharge noted.  Pap smear obtained.  Normal uterine size, no other palpable masses, no uterine or adnexal tenderness.  Performed in the presence of a chaperone. ?  ?Assessment and Plan:  ?   ?1. Dysmenorrhea treated with oral contraceptive ?Nexstellis recommended, will monitor effect. Samples given.  ?- Drospirenone-Estetrol (NEXTSTELLIS) 3-14.2 MG TABS; Take 1 tablet by mouth daily.  Dispense: 56 tablet; Refill: 2 ? ?2. Routine screening for STI (sexually transmitted infection) ?Only desires ancillary testing from pap, serum testing declined. Safe sex recommended. ?- Cytology - PAP( [place]) ? ?3. Well woman exam with routine gynecological exam ?- Cytology - PAP( [place]) ?Will follow up results of pap smear and manage accordingly. ?Patient will return for another visit to discuss fertility concerns. ?Routine preventative health maintenance measures emphasized. ?Please refer to After Visit Summary for other counseling recommendations.  ?   ? ?Jaynie Collins, MD, FACOG ?Obstetrician Heritage manager, Faculty Practice ?Center for [employer], [facility] ? ?

## 2022-01-26 LAB — CYTOLOGY - PAP
Chlamydia: NEGATIVE
Comment: NEGATIVE
Comment: NEGATIVE
Comment: NORMAL
Diagnosis: NEGATIVE
Diagnosis: REACTIVE
Neisseria Gonorrhea: NEGATIVE
Trichomonas: NEGATIVE

## 2022-02-05 ENCOUNTER — Telehealth: Payer: Self-pay | Admitting: Family

## 2022-02-05 NOTE — Telephone Encounter (Signed)
Patient is requesting a referral for a new psychiatrist and therapist. ?

## 2022-02-13 NOTE — Telephone Encounter (Signed)
Rasheedah ?Can you check on status of referral which I placed 10/18/22 ? ? ? ?Jenate,  ? ?Call pt ?Please sch asap appt with me ?Is she seeing psychiatrist currently? What is their name?  ? ?I have not seen pt in 4 months and want to discuss symptoms as well.  ?Please let her know that we are checking on referral from december ? ? ?

## 2022-02-14 NOTE — Telephone Encounter (Signed)
I called and spoke with patient's mom & she stated that patient no longer wanted to got to [facility]. She has been seeing a psychiatrist or counselor there with out much success. She said that she did most all appointments virtually & by phone. She tried to go into office & would still be called on the phone. Pt did not feel this was consistent & wanted in person visits with someone. There was another place in [place] that she wanted to be seen at & patient has called then directly. Pt's mom stated that it did seem that she would need referral since they did confirm that they did accept her insurance. She (mom) or patient  will let us know if anything else is needed.  ?

## 2022-02-14 NOTE — Telephone Encounter (Signed)
noted 

## 2022-03-01 ENCOUNTER — Encounter: Payer: Self-pay | Admitting: Obstetrics & Gynecology

## 2022-03-01 ENCOUNTER — Ambulatory Visit (INDEPENDENT_AMBULATORY_CARE_PROVIDER_SITE_OTHER): Payer: BC Managed Care – PPO | Admitting: Obstetrics & Gynecology

## 2022-03-01 VITALS — BP 118/75 | HR 83 | Wt 166.0 lb

## 2022-03-01 DIAGNOSIS — N979 Female infertility, unspecified: Secondary | ICD-10-CM | POA: Diagnosis not present

## 2022-03-01 NOTE — Patient Instructions (Addendum)
Labs ?Ovulation predictor kits ?Semen analysis ?HSG ?Possible ovulation induction medication (Letrozole) ?Infertility specialist referral ? ? ?Female Infertility ? ?Female infertility refers to a woman's inability to get pregnant (conceive) after a year of having sex regularly (or after 6 months in women over age 26) without using birth control. Infertility can also mean that a woman is not able to carry a pregnancy to full term. ?Both women and men can experience fertility problems. ?What are the causes? ?This condition may be caused by: ?Problems with reproductive organs. Infertility can result if a woman: ?Is not ovulating or is ovulating irregularly. ?Has a blockage or scarring in the fallopian tubes. ?Has uterine fibroids. This is a benign mass of tissue or muscle (tumor) that can develop in the uterus. ?Has an abnormally shaped uterus. ?Has an abnormally short cervix or a cervix that does not remain closed during a pregnancy. ?Certain medical conditions. These may include: ?Polycystic ovary syndrome (PCOS). This is a hormonal disorder that can cause small cysts to grow on the ovaries. This is the most common cause of infertility in women. ?Endometriosis. This is a condition in which the tissue that lines the uterus (endometrium) grows outside of its normal location. ?Cancer and cancer treatments, such as chemotherapy or radiation. ?Premature ovarian failure. This is when ovaries stop producing eggs and hormones before age 33. ?Sexually transmitted infections, such as chlamydia or gonorrhea. ?Autoimmune disorders. These are disorders in which the body's defense system (immune system) attacks normal, healthy cells. ?Infertility can be linked to more than one cause. For some women, the cause of infertility is not known (unexplained infertility). ?What increases the risk? ?The following factors may make you more likely to develop this condition: ?Age. A woman's fertility declines with age, especially after her  mid-30s. ?Stress. ?Smoking. ?Being underweight or overweight. ?Drinking too much alcohol. ?Using drugs such as anabolic steroids, cocaine, and marijuana. ?Exercising excessively. ?What are the signs or symptoms? ?The main sign of infertility in women is the inability to get pregnant or carry a pregnancy to full term. ?How is this diagnosed? ?This condition may be diagnosed by: ?Checking whether you are ovulating each month. The tests may include: ?Blood tests to check hormone levels. ?An ultrasound of the ovaries. ?Taking a sample of the tissue that lines the uterus and checking it under a microscope (endometrial biopsy). ?Doing additional tests. This is done if ovulation is normal. Tests may include: ?Hysterosalpingogram. This X-ray test can show the shape of the uterus and whether the fallopian tubes are open. ?Laparoscopy. This test uses a lighted tube (laparoscope) to look for problems in the fallopian tubes and other organs. ?Transvaginal ultrasound. This imaging test is used to check for abnormalities in the uterus and ovaries. ?Hysteroscopy. This test uses a lighted tube to check for problems in the cervix and the uterus. ?To be diagnosed with infertility, both partners will have a physical exam. Both partners will also have an extensive medical and sexual history taken. Additional tests may be done. ?How is this treated? ?Treatment depends on the cause of infertility. Most cases of infertility in women are treated with medicine or surgery. ?Women may take medicine to: ?Correct ovulation problems. ?Treat other health conditions. ?Surgery may be done to: ?Repair damage to the ovaries, fallopian tubes, cervix, or uterus. ?Remove growths from the uterus. ?Remove scar tissue from the uterus, pelvis, or other organs. ?Assisted reproductive technology (ART) ?Assisted reproductive technology (ART) refers to all treatments and procedures that combine eggs and sperm outside the  body to try to help a couple conceive.  ART is often combined with fertility drugs to stimulate ovulation. Sometimes ART is done using eggs retrieved from another woman's body (donor eggs) or from previously frozen fertilized eggs (embryos). ?There are different types of ART. These include: ?Intrauterine insemination (IUI). A long, thin tube is used to place sperm directly into a woman's uterus. This procedure: ?Is effective for infertility caused by sperm problems, including low sperm count and low motility. ?Can be used in combination with fertility drugs. ?In vitro fertilization (IVF). This is done when a woman's fallopian tubes are blocked or when a man has low sperm count. In this procedure: ?Fertility drugs are used to stimulate the ovaries to produce multiple eggs. ?Once mature, these eggs are removed from the body and combined with the sperm to be fertilized. ?The fertilized eggs are then placed into the woman's uterus. ?Follow these instructions at home: ?Lifestyle ?If you drink alcohol, limit how much you have to 0-1 drink a day. ?Do not use any products that contain nicotine or tobacco. These products include cigarettes, chewing tobacco, and vaping devices, such as e-cigarettes. If you need help quitting, ask your health care provider. ?Practice stress reduction techniques that work well for you, such as regular physical activity, meditation, or deep breathing. ?Make dietary changes to lose weight or maintain a healthy weight. Work with your health care provider and a dietitian to set a weight-loss goal that is healthy and reasonable for you. ?General instructions ?Take over-the-counter and prescription medicines only as told by your health care provider. ?Seek support from a counselor or support group to talk about your concerns related to infertility. Couples counseling may be helpful for you and your partner. ?Keep all follow-up visits. This is important. ?Where to find support ?Resolve - The National Infertility Association:  resolve.org ?Contact a health care provider if: ?You feel that stress is interfering with your life and relationships. ?You have side effects from treatments for infertility. ?Summary ?Female infertility refers to a woman's inability to get pregnant (conceive) after a year of having sex regularly (or after 6 months in women over age 2) without using birth control. ?To be diagnosed with infertility, both partners will have a physical exam. Both partners will also have an extensive medical and sexual history taken. ?Seek support from a counselor or support group to talk about your concerns related to infertility. Couples counseling may be helpful for you and your partner. ?This information is not intended to replace advice given to you by your health care provider. Make sure you discuss any questions you have with your health care provider. ?Document Revised: 12/20/2020 Document Reviewed: 12/20/2020 ?Elsevier Patient Education ? 2023 [employer]. ? ?

## 2022-03-01 NOTE — Progress Notes (Signed)
? ?GYNECOLOGY OFFICE VISIT NOTE ? ?History:  ? Connie Fernandez is a 26 y.o. G0 here today for discussion about fertility concerns.  Accompanied by her mother. Reports having unprotected intercourse for three years with her boyfriend. She has regular menstrual periods, and knows when she is ovulating and has sex around that time.  All her sisters are very fertile, she does not understand why she is not pregnant. Her boyfriend has a 25 year old child with a previous partner.  Patient does have painful menstrual periods, had negative laparoscopy for endometriosis evaluation in 2017.  No signs/symptoms of PCOS, nipple drainage, FH of thyroid dysfunction etc. She denies any abnormal vaginal discharge, bleeding, pelvic pain or other concerns. Of note, she was prescribed OCPs for dysmenorrhea during last visit, she has not started this yet.  She is unsure if she wants to have children now, just wants to make sure everything is okay for when she is ready and is planning to take the pills soon.  ?  ?Past Medical History:  ?Diagnosis Date  ? Anemia   ? Anxiety   ? Chronic pelvic pain in female   ? Had negative laparoscopy for endometriosis in 2017  ? Depression   ? Dysmenorrhea in adolescent   ? Headache   ? Migraines  ? Heart palpitations   ? Kidney stone   ? Shortness of breath dyspnea   ? with exertion  ? UTI (lower urinary tract infection)   ? ? ?Past Surgical History:  ?Procedure Laterality Date  ? LAPAROSCOPY N/A 06/12/2016  ? Procedure: LAPAROSCOPY DIAGNOSTIC;  Surgeon: Allie Bossier, MD;  Location: [facility];  Service: Gynecology;  Laterality: N/A;  ? ? ?The following portions of the patient's history were reviewed and updated as appropriate: allergies, current medications, past family history, past medical history, past social history, past surgical history and problem list.  ? ?Health Maintenance:  Normal pap on 01/23/2022.  ? ?Review of Systems:  ?Pertinent items noted in HPI and remainder of comprehensive ROS otherwise  negative. ? ?Physical Exam:  ?BP 118/75   Pulse 83   Wt 166 lb (75.3 kg)   LMP 02/24/2022 (Exact Date)   BMI 30.36 kg/m?  ?CONSTITUTIONAL: Well-developed, well-nourished female in no acute distress.  ?HEENT:  Normocephalic, atraumatic. External right and left ear normal. No scleral icterus.  ?NECK: Normal range of motion, supple, no masses noted on observation ?SKIN: No rash noted. Not diaphoretic. No erythema. No pallor. ?MUSCULOSKELETAL: Normal range of motion. No edema noted. ?NEUROLOGIC: Alert and oriented to person, place, and time. Normal muscle tone coordination. No cranial nerve deficit noted. ?PSYCHIATRIC: Normal mood and affect. Normal behavior. Normal judgment and thought content. ?CARDIOVASCULAR: Normal heart rate noted ?RESPIRATORY: Effort and breath sounds normal, no problems with respiration noted ?ABDOMEN: No masses noted. No other overt distention noted.   ?PELVIC: Deferred ?    ?Assessment and Plan:  ?   ?Female fertility concerns ?Reassured by regular menstrual periods as evidence of ovulation, but use of ovulation predictor kits recommended for objective annotation of her ovulation.  Also recommended semen analysis for her partner, but reassured that he has fathered another child.  HSG ordered to evaluate for tubal patency, patient can get this if desired.   ?- DG Hysterogram (HSG); Future ?- TSH+Prl+TestT+TestF+17OHP ?- Hemoglobin A1c ?- FSH/LH ?- Anti mullerian hormone ?Depending on labs and other results, patient may need ovulation induction agents (Letrozole) or referral to Infertility specialist. This was discussed with patient.   ? ?Routine  preventative health maintenance measures emphasized. ?Please refer to After Visit Summary for other counseling recommendations.  ? ?Return for any gynecologic concerns.   ? ?I spent 30 minutes dedicated to the care of this patient including pre-visit review of records, face to face time with the patient discussing her conditions and treatments and  post visit orders. ? ? ? ?Jaynie Collins, MD, FACOG ?Obstetrician Heritage manager, Faculty Practice ?[facility] for [employer], [facility] ? ? ? ? ? ? ?

## 2022-03-08 LAB — TSH+PRL+TESTT+TESTF+17OHP
17-Hydroxyprogesterone: 55 ng/dL
Prolactin: 19.5 ng/mL (ref 4.8–23.3)
TSH: 0.814 u[IU]/mL (ref 0.450–4.500)
Testosterone, Free: 4.9 pg/mL — ABNORMAL HIGH (ref 0.0–4.2)
Testosterone, Total, LC/MS: 26.4 ng/dL (ref 10.0–55.0)

## 2022-03-08 LAB — HEMOGLOBIN A1C
Est. average glucose Bld gHb Est-mCnc: 105 mg/dL
Hgb A1c MFr Bld: 5.3 % (ref 4.8–5.6)

## 2022-03-08 LAB — FSH/LH
FSH: 6.3 m[IU]/mL
LH: 6.4 m[IU]/mL

## 2022-03-08 LAB — ANTI MULLERIAN HORMONE: ANTI-MULLERIAN HORMONE (AMH): 2.13 ng/mL

## 2022-03-20 ENCOUNTER — Telehealth: Payer: BC Managed Care – PPO | Admitting: Family Medicine

## 2022-03-20 DIAGNOSIS — R112 Nausea with vomiting, unspecified: Secondary | ICD-10-CM | POA: Diagnosis not present

## 2022-03-20 DIAGNOSIS — F3181 Bipolar II disorder: Secondary | ICD-10-CM

## 2022-03-20 MED ORDER — ONDANSETRON HCL 4 MG PO TABS: 4.0000 mg | ORAL_TABLET | Freq: Three times a day (TID) | ORAL | 0 refills | Status: DC | PRN

## 2022-03-20 NOTE — Patient Instructions (Signed)
Please follow up with you PCP so that you can be seen for on going treatment needs ?

## 2022-03-20 NOTE — Progress Notes (Signed)
?Virtual Visit Consent  ? ?Connie Fernandez, you are scheduled for a virtual visit with a [facility] provider today. Just as with appointments in the office, your consent must be obtained to participate. Your consent will be active for this visit and any virtual visit you may have with one of our providers in the next 365 days. If you have a MyChart account, a copy of this consent can be sent to you electronically. ? ?As this is a virtual visit, video technology does not allow for your provider to perform a traditional examination. This may limit your provider's ability to fully assess your condition. If your provider identifies any concerns that need to be evaluated in person or the need to arrange testing (such as labs, EKG, etc.), we will make arrangements to do so. Although advances in technology are sophisticated, we cannot ensure that it will always work on either your end or our end. If the connection with a video visit is poor, the visit may have to be switched to a telephone visit. With either a video or telephone visit, we are not always able to ensure that we have a secure connection. ? ?By engaging in this virtual visit, you consent to the provision of healthcare and authorize for your insurance to be billed (if applicable) for the services provided during this visit. Depending on your insurance coverage, you may receive a charge related to this service. ? ?I need to obtain your verbal consent now. Are you willing to proceed with your visit today? Connie Fernandez has provided verbal consent on 03/20/2022 for a virtual visit (video or telephone). Freddy Finner, NP ? ?Date: 03/20/2022 12:04 PM ? ?Virtual Visit via Video Note  ? ?Connie Fernandez, connected with  Connie Fernandez  ([id number], Nov 28, 1995) on 03/20/22 at 12:00 PM EDT by a video-enabled telemedicine application and verified that I am speaking with the correct person using two identifiers. ? ?Location: ?Patient: Virtual Visit Location  Patient: Home ?Provider: Virtual Visit Location Provider: Home Office ?  ?I discussed the limitations of evaluation and management by telemedicine and the availability of in person appointments. The patient expressed understanding and agreed to proceed.   ? ?History of Present Illness: ?Connie Fernandez is a 26 y.o. who identifies as a female who was assigned female at birth, and is being seen today for nausea. ? ?HPI: Emesis  ?This is a new problem. The current episode started in the past 7 days. The problem occurs less than 2 times per day. The problem has been waxing and waning. There has been no fever. Associated symptoms include abdominal pain, headaches and sweats. Pertinent negatives include no arthralgias, chest pain, chills, coughing, diarrhea, dizziness, fever, myalgias, URI or weight loss. She has tried nothing (related to stopping neurontin abrutly after months on it) for the symptoms. The treatment provided no relief.   ?Problems:  ?Patient Active Problem List  ? Diagnosis Date Noted  ? Gastritis 08/01/2021  ? Bipolar 2 disorder (HCC) 01/19/2020  ? Opioid use disorder, mild, abuse (HCC) 01/19/2020  ? Hyperglycemia 12/15/2019  ? Other fatigue 10/13/2018  ? Acute cystitis with hematuria 07/25/2017  ? History of drug use 07/25/2017  ? Acne 02/19/2017  ? Dysmenorrhea 02/19/2017  ? Pelvic floor dysfunction 10/05/2016  ? Primary dysmenorrhea 10/05/2016  ? Chronic pelvic pain in female 07/16/2016  ? Tobacco use disorder 10/28/2015  ? Panic disorder 10/28/2015  ? Major depressive disorder, recurrent episode (HCC) 10/28/2015  ?  ?Allergies:  ?Allergies  ?Allergen  Reactions  ? Latex Itching  ? Penicillins Hives  ?  Has patient had a PCN reaction causing immediate rash, facial/tongue/throat swelling, SOB or lightheadedness with hypotension: Dizziness and shaky. ?Has patient had a PCN reaction causing severe rash involving mucus membranes or skin necrosis: NO ?Has patient had a PCN reaction that required  hospitalization: NO ?Has patient had a PCN reaction occurring within the last 10 years: NO ?If all of the above answers are "NO", then may proceed with Cephalosporin use. ?  ? ?Medications:  ?Current Outpatient Medications:  ?  Drospirenone-Estetrol (NEXTSTELLIS) 3-14.2 MG TABS, Take 1 tablet by mouth daily. (Patient not taking: Reported on 03/01/2022), Disp: 56 tablet, Rfl: 2 ?  gabapentin (NEURONTIN) 600 MG tablet, Take 600 mg by mouth 4 (four) times daily., Disp: , Rfl:  ?  prazosin (MINIPRESS) 2 MG capsule, Take by mouth at bedtime., Disp: , Rfl:  ? ?Observations/Objective: ?Patient is well-developed, well-nourished in no acute distress.  ?Resting comfortably  at home.  ?Head is normocephalic, atraumatic.  ?No labored breathing.  ?Speech is clear and coherent with logical content.  ?Patient is alert and oriented at baseline.  ? ? ?Assessment and Plan: ?1. Nausea and vomiting, unspecified vomiting type ? ? ?- ondansetron (ZOFRAN) 4 MG tablet; Take 1 tablet (4 mg total) by mouth every 8 (eight) hours as needed for nausea or vomiting.  Dispense: 15 tablet; Refill: 0 ? ?2. Bipolar 2 disorder (HCC) ? ? ?S&S of n/v might be related to an abrupt stop in gabapentin medication which is reported for use regard anxiety. Is going to be seeing a new provider and started on several new meds including prozac, but has not started yet due to the on going n/v she has right now.  ?Advised on follow up is it continues past several more days with zofran use.  ? ? Reviewed side effects, risks and benefits of medication.   ? ?Patient acknowledged agreement and understanding of the plan.  ? ?Pt reports she is not pregnant.  ? ?Work note for today provided ? ?Follow Up Instructions: ?I discussed the assessment and treatment plan with the patient. The patient was provided an opportunity to ask questions and all were answered. The patient agreed with the plan and demonstrated an understanding of the instructions.  A copy of instructions  were sent to the patient via MyChart unless otherwise noted below.  ? ? ?The patient was advised to call back or seek an in-person evaluation if the symptoms worsen or if the condition fails to improve as anticipated. ? ?Time:  ?I spent 18 minutes with the patient via telehealth technology discussing the above problems/concerns.   ? ?Freddy Finner, NP ? ?

## 2022-05-21 ENCOUNTER — Other Ambulatory Visit: Payer: Self-pay | Admitting: *Deleted

## 2022-05-21 DIAGNOSIS — N946 Dysmenorrhea, unspecified: Secondary | ICD-10-CM

## 2022-05-21 MED ORDER — NEXTSTELLIS 3-14.2 MG PO TABS: 1.0000 | ORAL_TABLET | Freq: Every day | ORAL | 2 refills | Status: DC

## 2022-05-21 NOTE — Telephone Encounter (Signed)
Pt called wanting a script called in of the OCP samples she was given

## 2022-05-22 ENCOUNTER — Other Ambulatory Visit: Payer: Self-pay | Admitting: *Deleted

## 2022-05-22 MED ORDER — NORGESTIMATE-ETH ESTRADIOL 0.25-35 MG-MCG PO TABS: 1.0000 | ORAL_TABLET | Freq: Every day | ORAL | 3 refills | Status: DC

## 2022-06-08 ENCOUNTER — Ambulatory Visit (INDEPENDENT_AMBULATORY_CARE_PROVIDER_SITE_OTHER): Payer: BC Managed Care – PPO | Admitting: Family

## 2022-06-08 ENCOUNTER — Encounter: Payer: Self-pay | Admitting: Family

## 2022-06-08 VITALS — BP 122/74 | HR 84 | Temp 98.4°F | Ht 62.0 in | Wt 162.8 lb

## 2022-06-08 DIAGNOSIS — Z0000 Encounter for general adult medical examination without abnormal findings: Secondary | ICD-10-CM | POA: Insufficient documentation

## 2022-06-08 DIAGNOSIS — R112 Nausea with vomiting, unspecified: Secondary | ICD-10-CM | POA: Diagnosis not present

## 2022-06-08 DIAGNOSIS — F419 Anxiety disorder, unspecified: Secondary | ICD-10-CM | POA: Diagnosis not present

## 2022-06-08 LAB — IBC + FERRITIN
Ferritin: 4.3 ng/mL — ABNORMAL LOW (ref 10.0–291.0)
Iron: 35 ug/dL — ABNORMAL LOW (ref 42–145)
Saturation Ratios: 7 % — ABNORMAL LOW (ref 20.0–50.0)
TIBC: 501.2 ug/dL — ABNORMAL HIGH (ref 250.0–450.0)
Transferrin: 358 mg/dL (ref 212.0–360.0)

## 2022-06-08 LAB — CBC WITH DIFFERENTIAL/PLATELET
Basophils Absolute: 0.1 10*3/uL (ref 0.0–0.1)
Basophils Relative: 1.2 % (ref 0.0–3.0)
Eosinophils Absolute: 0 10*3/uL (ref 0.0–0.7)
Eosinophils Relative: 0 % (ref 0.0–5.0)
HCT: 38.5 % (ref 36.0–46.0)
Hemoglobin: 13.1 g/dL (ref 12.0–15.0)
Lymphocytes Relative: 22 % (ref 12.0–46.0)
Lymphs Abs: 1.2 10*3/uL (ref 0.7–4.0)
MCHC: 34.1 g/dL (ref 30.0–36.0)
MCV: 83.2 fl (ref 78.0–100.0)
Monocytes Absolute: 0.5 10*3/uL (ref 0.1–1.0)
Monocytes Relative: 9.1 % (ref 3.0–12.0)
Neutro Abs: 3.8 10*3/uL (ref 1.4–7.7)
Neutrophils Relative %: 67.7 % (ref 43.0–77.0)
Platelets: 259 10*3/uL (ref 150.0–400.0)
RBC: 4.62 Mil/uL (ref 3.87–5.11)
RDW: 16.7 % — ABNORMAL HIGH (ref 11.5–15.5)
WBC: 5.7 10*3/uL (ref 4.0–10.5)

## 2022-06-08 LAB — COMPREHENSIVE METABOLIC PANEL
ALT: 18 U/L (ref 0–35)
AST: 16 U/L (ref 0–37)
Albumin: 4.6 g/dL (ref 3.5–5.2)
Alkaline Phosphatase: 50 U/L (ref 39–117)
BUN: 8 mg/dL (ref 6–23)
CO2: 30 mEq/L (ref 19–32)
Calcium: 9.1 mg/dL (ref 8.4–10.5)
Chloride: 103 mEq/L (ref 96–112)
Creatinine, Ser: 0.9 mg/dL (ref 0.40–1.20)
GFR: 88.58 mL/min (ref >60.00)
Glucose, Bld: 67 mg/dL — ABNORMAL LOW (ref 70–99)
Potassium: 4.1 mEq/L (ref 3.5–5.1)
Sodium: 138 mEq/L (ref 135–145)
Total Bilirubin: 0.3 mg/dL (ref 0.2–1.2)
Total Protein: 7.4 g/dL (ref 6.0–8.3)

## 2022-06-08 LAB — VITAMIN D 25 HYDROXY (VIT D DEFICIENCY, FRACTURES): VITD: 8.99 ng/mL — ABNORMAL LOW (ref 30.00–100.00)

## 2022-06-08 MED ORDER — HYDROXYZINE HCL 10 MG PO TABS: 10.0000 mg | ORAL_TABLET | Freq: Two times a day (BID) | ORAL | 1 refills | Status: DC | PRN

## 2022-06-08 NOTE — Progress Notes (Signed)
Subjective:    Patient ID: Connie Fernandez, female    DOB: 1996-07-21, 26 y.o.   MRN: [id number]  CC: Connie Fernandez is a 26 y.o. female who presents today for physical exam.    HPI: Feels well today.  No new complaints  She has stopped illicit drug use. She no longer drinks alcohol or smokes cigarettes. 'I am in a better place.'  She is living with her parents  Denies depression.  She does have episodic anxiety.  In the past she had taken hydroxyzine and would like to try this medication again.  She does not feel that she needs a daily medication.  Sleeping well.  No thoughts of hurting herself or anyone else    Follows with [employer] [facility] and is taking Medical laboratory scientific officer.    Colorectal Cancer Screening: No early family history colon cancer Breast Cancer Screening: No early family history of breast cancer Cervical Cancer Screening: UTD, 01/23/22 following with Dr Macon Large        Tetanus - due; she declines        HPV- she declines Labs: Screening labs today.  Alcohol use:  occassional Smoking/tobacco use: She is no longer smoking.    HISTORY:  Past Medical History:  Diagnosis Date   Anemia    Anxiety    Chronic pelvic pain in female    Had negative laparoscopy for endometriosis in 2017   Depression    Dysmenorrhea in adolescent    Headache    Migraines   Heart palpitations    Kidney stone    Shortness of breath dyspnea    with exertion   UTI (lower urinary tract infection)     Past Surgical History:  Procedure Laterality Date   LAPAROSCOPY N/A 06/12/2016   Procedure: LAPAROSCOPY DIAGNOSTIC;  Surgeon: Allie Bossier, MD;  Location: [facility];  Service: Gynecology;  Laterality: N/A;   Family History  Problem Relation Age of Onset   Diabetes Mother    Alcohol abuse Father    Hypertension Father    COPD Maternal Grandmother    Early death Maternal Grandmother 64   Heart disease Maternal Grandmother    Heart disease Maternal Grandfather 64    Parkinsonism Paternal Grandmother       ALLERGIES: Latex and Penicillins  Current Outpatient Medications on File Prior to Visit  Medication Sig Dispense Refill   norgestimate-ethinyl estradiol (ORTHO-CYCLEN) 0.25-35 MG-MCG tablet Take 1 tablet by mouth daily. 84 tablet 3   No current facility-administered medications on file prior to visit.    Social History   Tobacco Use   Smoking status: Every Day    Packs/day: 0.00    Years: 1.00    Total pack years: 0.00    Types: E-cigarettes, Cigarettes   Smokeless tobacco: Never  Vaping Use   Vaping Use: Every day  Substance Use Topics   Alcohol use: Not Currently    Comment: few drinks per month.    Drug use: No    Review of Systems  Constitutional:  Negative for chills, fever and unexpected weight change.  HENT:  Negative for congestion.   Respiratory:  Negative for cough.   Cardiovascular:  Negative for chest pain, palpitations and leg swelling.  Gastrointestinal:  Negative for nausea and vomiting.  Musculoskeletal:  Negative for arthralgias and myalgias.  Skin:  Negative for rash.  Neurological:  Negative for headaches.  Hematological:  Negative for adenopathy.  Psychiatric/Behavioral:  Positive for sleep disturbance. Negative for confusion. The patient is  not nervous/anxious.       Objective:    BP 122/74 (BP Location: Left Arm, Patient Position: Sitting, Cuff Size: Normal)   Pulse 84   Temp 98.4 F (36.9 C) (Oral)   Ht 5\' 2"  (1.575 m)   Wt 162 lb 12.8 oz (73.8 kg)   LMP  (LMP Unknown)   SpO2 99%   BMI 29.78 kg/m   BP Readings from Last 3 Encounters:  06/08/22 122/74  03/01/22 118/75  01/23/22 116/77   Wt Readings from Last 3 Encounters:  06/08/22 162 lb 12.8 oz (73.8 kg)  03/01/22 166 lb (75.3 kg)  01/23/22 161 lb 3.2 oz (73.1 kg)    Physical Exam Vitals reviewed.  Constitutional:      Appearance: Normal appearance. She is well-developed.  Eyes:     Conjunctiva/sclera: Conjunctivae normal.  Neck:      Thyroid: No thyroid mass or thyromegaly.  Cardiovascular:     Rate and Rhythm: Normal rate and regular rhythm.     Pulses: Normal pulses.     Heart sounds: Normal heart sounds.  Pulmonary:     Effort: Pulmonary effort is normal.     Breath sounds: Normal breath sounds. No wheezing, rhonchi or rales.  Chest:  Breasts:    Breasts are symmetrical.     Right: No inverted nipple, mass, nipple discharge, skin change or tenderness.     Left: No inverted nipple, mass, nipple discharge, skin change or tenderness.  Abdominal:     General: Bowel sounds are normal. There is no distension.     Palpations: Abdomen is soft. Abdomen is not rigid. There is no fluid wave or mass.     Tenderness: There is no abdominal tenderness. There is no guarding or rebound.  Lymphadenopathy:     Head:     Right side of head: No submental, submandibular, tonsillar, preauricular, posterior auricular or occipital adenopathy.     Left side of head: No submental, submandibular, tonsillar, preauricular, posterior auricular or occipital adenopathy.     Cervical: No cervical adenopathy.     Right cervical: No superficial, deep or posterior cervical adenopathy.    Left cervical: No superficial, deep or posterior cervical adenopathy.  Skin:    General: Skin is warm and dry.  Neurological:     Mental Status: She is alert.  Psychiatric:        Speech: Speech normal.        Behavior: Behavior normal.        Thought Content: Thought content normal.        Assessment & Plan:   Problem List Items Addressed This Visit       Other   Annual physical exam - Primary    Deferred pelvic exam in the absence of complaints and Pap smear is up-to-date.  She will continue to follow with GYN.  Clinical breast exam performed today.  Discussed recommendation for tetanus vaccine and HPV vaccines today to reduce risk of cervical cancer.  She politely declines either vaccine at this time.      Relevant Orders   IBC + Ferritin    Comprehensive metabolic panel   CBC with Differential/Platelet   VITAMIN D 25 Hydroxy (Vit-D Deficiency, Fractures)   Anxiety    Very pleased patient's overall appearance and improvement today.  No longer using illicit drugs.  Episodic anxiety.  Agreed to send in hydroxyzine 10 mg twice daily as needed.  She will let me know how she is doing.  Relevant Medications   hydrOXYzine (ATARAX) 10 MG tablet   Other Visit Diagnoses     Nausea and vomiting, unspecified vomiting type            I have discontinued Nereyda Khun's gabapentin, prazosin, ondansetron, and Nextstellis. I am also having her start on hydrOXYzine. Additionally, I am having her maintain her norgestimate-ethinyl estradiol.   Meds ordered this encounter  Medications   hydrOXYzine (ATARAX) 10 MG tablet    Sig: Take 1 tablet (10 mg total) by mouth 2 (two) times daily as needed for anxiety.    Dispense:  30 tablet    Refill:  1    Order Specific Question:   Supervising Provider    Answer:   Sherlene Shams [[id number]]    Return precautions given.   Risks, benefits, and alternatives of the medications and treatment plan prescribed today were discussed, and patient expressed understanding.   Education regarding symptom management and diagnosis given to patient on AVS.   Continue to follow with Allegra Grana, FNP for routine health maintenance.   Wyatt Portela and I agreed with plan.   Rennie Plowman, FNP

## 2022-06-08 NOTE — Assessment & Plan Note (Signed)
Deferred pelvic exam in the absence of complaints and Pap smear is up-to-date.  She will continue to follow with GYN.  Clinical breast exam performed today.  Discussed recommendation for tetanus vaccine and HPV vaccines today to reduce risk of cervical cancer.  She politely declines either vaccine at this time.

## 2022-06-08 NOTE — Assessment & Plan Note (Signed)
Very pleased patient's overall appearance and improvement today.  No longer using illicit drugs.  Episodic anxiety.  Agreed to send in hydroxyzine 10 mg twice daily as needed.  She will let me know how she is doing.

## 2022-06-08 NOTE — Progress Notes (Signed)
Wants to discus birth control?? HPV?? No Tetanus

## 2022-06-12 ENCOUNTER — Other Ambulatory Visit: Payer: Self-pay | Admitting: Family

## 2022-06-12 DIAGNOSIS — E559 Vitamin D deficiency, unspecified: Secondary | ICD-10-CM

## 2022-06-12 MED ORDER — CHOLECALCIFEROL 1.25 MG (50000 UT) PO TABS: ORAL_TABLET | ORAL | 0 refills | Status: DC

## 2022-06-21 ENCOUNTER — Other Ambulatory Visit: Payer: Self-pay | Admitting: Family

## 2022-06-21 DIAGNOSIS — F419 Anxiety disorder, unspecified: Secondary | ICD-10-CM

## 2023-02-19 ENCOUNTER — Ambulatory Visit
Admission: RE | Admit: 2023-02-19 | Discharge: 2023-02-19 | Disposition: A | Discharge status: Home / Self Care | Payer: Managed Care, Other (non HMO) | Source: Ambulatory Visit | Attending: Internal Medicine | Admitting: Internal Medicine

## 2023-02-19 ENCOUNTER — Ambulatory Visit (INDEPENDENT_AMBULATORY_CARE_PROVIDER_SITE_OTHER): Payer: Managed Care, Other (non HMO)

## 2023-02-19 ENCOUNTER — Other Ambulatory Visit: Payer: Self-pay

## 2023-02-19 VITALS — BP 115/79 | HR 77 | Temp 98.1°F | Resp 20

## 2023-02-19 DIAGNOSIS — S060X0A Concussion without loss of consciousness, initial encounter: Secondary | ICD-10-CM | POA: Diagnosis not present

## 2023-02-19 DIAGNOSIS — S0990XA Unspecified injury of head, initial encounter: Secondary | ICD-10-CM

## 2023-02-19 MED ORDER — OXAPROZIN 600 MG PO TABS: 600.0000 mg | ORAL_TABLET | Freq: Two times a day (BID) | ORAL | 0 refills | Status: AC

## 2023-02-19 MED ORDER — METAXALONE 800 MG PO TABS: 800.0000 mg | ORAL_TABLET | Freq: Three times a day (TID) | ORAL | 0 refills | Status: DC

## 2023-02-19 NOTE — ED Notes (Signed)
Called [facility] to initiate PA for CT Head w/o contrast.  PA has been approved.  Case [id number] Effective:02/19/23-08/18/23 Ref #:[id number]

## 2023-02-19 NOTE — Discharge Instructions (Signed)
Minimize screen time to only one hour a day Follow up with your primary care doctor, or concussion headache specialist in 4 days.
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
## 2023-02-21 ENCOUNTER — Telehealth: Payer: Self-pay | Admitting: Emergency Medicine

## 2023-02-21 MED ORDER — NAPROXEN SODIUM 550 MG PO TABS: 550.0000 mg | ORAL_TABLET | Freq: Two times a day (BID) | ORAL | 0 refills | Status: DC

## 2023-02-21 NOTE — Telephone Encounter (Signed)
Notification from pharmacy that they do not carry Daypro, medication discontinued and prescribed naproxen

## 2023-03-12 ENCOUNTER — Ambulatory Visit (INDEPENDENT_AMBULATORY_CARE_PROVIDER_SITE_OTHER): Payer: Managed Care, Other (non HMO) | Admitting: Family

## 2023-03-12 ENCOUNTER — Encounter: Payer: Self-pay | Admitting: Family

## 2023-03-12 VITALS — BP 118/70 | HR 88 | Temp 97.8°F | Ht 62.0 in | Wt 159.2 lb

## 2023-03-12 DIAGNOSIS — F419 Anxiety disorder, unspecified: Secondary | ICD-10-CM | POA: Diagnosis not present

## 2023-03-12 MED ORDER — HYDROXYZINE HCL 10 MG PO TABS: 10.0000 mg | ORAL_TABLET | Freq: Two times a day (BID) | ORAL | 1 refills | Status: DC | PRN

## 2023-03-12 NOTE — Assessment & Plan Note (Addendum)
Chronic, overall stable.  Provided refill for atarax.   Patient will let me know how she is doing.
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
## 2023-04-09 ENCOUNTER — Emergency Department: Payer: Managed Care, Other (non HMO)

## 2023-04-09 ENCOUNTER — Other Ambulatory Visit: Payer: Self-pay

## 2023-04-09 ENCOUNTER — Encounter: Payer: Self-pay | Admitting: Emergency Medicine

## 2023-04-09 ENCOUNTER — Emergency Department
Admission: EM | Admit: 2023-04-09 | Discharge: 2023-04-09 | Disposition: A | Discharge status: Home / Self Care | Payer: Managed Care, Other (non HMO) | Attending: Emergency Medicine | Admitting: Emergency Medicine

## 2023-04-09 DIAGNOSIS — S0990XA Unspecified injury of head, initial encounter: Secondary | ICD-10-CM | POA: Diagnosis not present

## 2023-04-09 DIAGNOSIS — R1031 Right lower quadrant pain: Secondary | ICD-10-CM | POA: Diagnosis present

## 2023-04-09 DIAGNOSIS — S3991XA Unspecified injury of abdomen, initial encounter: Secondary | ICD-10-CM | POA: Diagnosis not present

## 2023-04-09 DIAGNOSIS — Y92410 Unspecified street and highway as the place of occurrence of the external cause: Secondary | ICD-10-CM | POA: Diagnosis not present

## 2023-04-09 LAB — CBC WITH DIFFERENTIAL/PLATELET
Abs Immature Granulocytes: 0.02 10*3/uL (ref 0.00–0.07)
Basophils Absolute: 0.1 10*3/uL (ref 0.0–0.1)
Basophils Relative: 1 %
Eosinophils Absolute: 0 10*3/uL (ref 0.0–0.5)
Eosinophils Relative: 0 %
HCT: 40.8 % (ref 36.0–46.0)
Hemoglobin: 13.6 g/dL (ref 12.0–15.0)
Immature Granulocytes: 0 %
Lymphocytes Relative: 25 %
Lymphs Abs: 2 10*3/uL (ref 0.7–4.0)
MCH: 28.9 pg (ref 26.0–34.0)
MCHC: 33.3 g/dL (ref 30.0–36.0)
MCV: 86.6 fL (ref 80.0–100.0)
Monocytes Absolute: 0.5 10*3/uL (ref 0.1–1.0)
Monocytes Relative: 6 %
Neutro Abs: 5.4 10*3/uL (ref 1.7–7.7)
Neutrophils Relative %: 68 %
Platelets: 257 10*3/uL (ref 150–400)
RBC: 4.71 MIL/uL (ref 3.87–5.11)
RDW: 13.2 % (ref 11.5–15.5)
WBC: 8 10*3/uL (ref 4.0–10.5)
nRBC: 0 % (ref 0.0–0.2)

## 2023-04-09 LAB — URINALYSIS, ROUTINE W REFLEX MICROSCOPIC
Bilirubin Urine: NEGATIVE
Glucose, UA: NEGATIVE mg/dL
Hgb urine dipstick: NEGATIVE
Ketones, ur: NEGATIVE mg/dL
Leukocytes,Ua: NEGATIVE
Nitrite: NEGATIVE
Protein, ur: 30 mg/dL — AB
Specific Gravity, Urine: 1.027 (ref 1.005–1.030)
pH: 5 (ref 5.0–8.0)

## 2023-04-09 LAB — POC URINE PREG, ED: Preg Test, Ur: NEGATIVE

## 2023-04-09 LAB — COMPREHENSIVE METABOLIC PANEL
ALT: 10 U/L (ref 0–44)
AST: 17 U/L (ref 15–41)
Albumin: 4.5 g/dL (ref 3.5–5.0)
Alkaline Phosphatase: 50 U/L (ref 38–126)
Anion gap: 10 (ref 5–15)
BUN: 7 mg/dL (ref 6–20)
CO2: 24 mmol/L (ref 22–32)
Calcium: 9 mg/dL (ref 8.9–10.3)
Chloride: 105 mmol/L (ref 98–111)
Creatinine, Ser: 0.85 mg/dL (ref 0.44–1.00)
GFR, Estimated: >60 mL/min (ref >60)
Glucose, Bld: 96 mg/dL (ref 70–99)
Potassium: 3.5 mmol/L (ref 3.5–5.1)
Sodium: 139 mmol/L (ref 135–145)
Total Bilirubin: 0.6 mg/dL (ref 0.3–1.2)
Total Protein: 7.6 g/dL (ref 6.5–8.1)

## 2023-04-09 MED ORDER — MELOXICAM 15 MG PO TABS: 15.0000 mg | ORAL_TABLET | Freq: Every day | ORAL | 2 refills | Status: DC

## 2023-04-09 MED ORDER — BACLOFEN 10 MG PO TABS: 10.0000 mg | ORAL_TABLET | Freq: Three times a day (TID) | ORAL | 0 refills | Status: AC

## 2023-04-09 MED ORDER — IOHEXOL 300 MG/ML  SOLN
100.0000 mL | Freq: Once | INTRAMUSCULAR | Status: AC | PRN
  Administered 2023-04-09: 100 mL via INTRAVENOUS
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
## 2023-04-12 ENCOUNTER — Telehealth: Payer: Self-pay

## 2023-04-12 NOTE — Transitions of Care (Post Inpatient/ED Visit) (Cosign Needed)
Unable to reach pt by phone and left v/n requesting pt to call [phone].      04/12/2023  Name: Elizet Kaplan MRN: [id number] DOB: August 26, 1996  Today's TOC FU Call Status: Today's TOC FU Call Status:: Unsuccessul Call (1st Attempt) Unsuccessful Call (1st Attempt) Date: 04/12/23  Attempted to reach the patient regarding the most recent Inpatient/ED visit.  Follow Up Plan: Additional outreach attempts will be made to reach the patient to complete the Transitions of Care (Post Inpatient/ED visit) call.   Signature Lewanda Rife, LPN

## 2023-04-23 ENCOUNTER — Ambulatory Visit (INDEPENDENT_AMBULATORY_CARE_PROVIDER_SITE_OTHER): Payer: Managed Care, Other (non HMO) | Admitting: Family

## 2023-04-23 ENCOUNTER — Encounter: Payer: Self-pay | Admitting: Family

## 2023-04-23 VITALS — BP 116/76 | HR 75 | Temp 97.9°F | Ht 62.0 in | Wt 157.6 lb

## 2023-04-23 DIAGNOSIS — R21 Rash and other nonspecific skin eruption: Secondary | ICD-10-CM | POA: Diagnosis not present

## 2023-04-23 MED ORDER — LORATADINE 10 MG PO TABS
10.0000 mg | ORAL_TABLET | Freq: Every day | ORAL | 11 refills | Status: DC
Start: 2023-04-23 — End: 2023-08-01

## 2023-04-23 MED ORDER — TRIAMCINOLONE ACETONIDE 0.025 % EX CREA
1.0000 | TOPICAL_CREAM | Freq: Two times a day (BID) | CUTANEOUS | 2 refills | Status: DC
Start: 2023-04-23 — End: 2023-08-01

## 2023-04-23 NOTE — Assessment & Plan Note (Addendum)
Rash is not present today.  Based on description, suspect atopic dermatitis.  Treating with low potency triamcinolone and counseled on limiting use to less than 7 days due to atrophy.  Start Claritin .Alternatively discussed rosacea.  Counseled on trial of stopping all cosmetics to see if there is a product aggravating.

## 2023-04-23 NOTE — Patient Instructions (Signed)
I question if atopic dermatitis (eczema).  Also with question of rosacea.  Trial of triamcinolone.  Please let me know how you are doing  Atopic Dermatitis Atopic dermatitis is a skin disorder that causes inflammation of the skin. It is marked by a red rash and itchy, dry, scaly skin. It is the most common type of eczema. Eczema is a group of skin conditions that cause the skin to become rough and swollen. This condition is generally worse during the cooler winter months and often improves during the warm summer months. Atopic dermatitis usually starts showing signs in infancy and can last through adulthood. This condition cannot be passed from one person to another (is not contagious). Atopic dermatitis may not always be present, but when it is, it is called a flare-up. What are the causes? The exact cause of this condition is not known. Flare-ups may be triggered by: Coming in contact with something that you are sensitive or allergic to (allergen). Stress. Certain foods. Extremely hot or cold weather. Harsh chemicals and soaps. Dry air. Chlorine. What increases the risk? This condition is more likely to develop in people who have a personal or family history of: Eczema. Allergies. Asthma. Hay fever. What are the signs or symptoms? Symptoms of this condition include: Dry, scaly skin. Red, itchy rash. Itchiness, which can be severe. This may occur before the skin rash. This can make sleeping difficult. Skin thickening and cracking that can occur over time. How is this diagnosed? This condition is diagnosed based on: Your symptoms. Your medical history. A physical exam. How is this treated? There is no cure for this condition, but symptoms can usually be controlled. Treatment focuses on: Controlling the itchiness and scratching. You may be given medicines, such as antihistamines or steroid creams. Limiting exposure to allergens. Recognizing situations that cause stress and  developing a plan to manage stress. If your atopic dermatitis does not get better with medicines, or if it is all over your body (widespread), a treatment using a specific type of light (phototherapy) may be used. Follow these instructions at home: Skin care  Keep your skin well moisturized. Doing this seals in moisture and helps to prevent dryness. Use unscented lotions that have petroleum in them. Avoid lotions that contain alcohol or water. They can dry the skin. Keep baths or showers short (less than 5 minutes) in warm water. Do not use hot water. Use mild, unscented cleansers for bathing. Avoid soap and bubble bath. Apply a moisturizer to your skin right after a bath or shower. Do not apply anything to your skin without checking with your health care provider. General instructions Take or apply over-the-counter and prescription medicines only as told by your health care provider. Dress in clothes made of cotton or cotton blends. Dress lightly because heat increases itchiness. When washing your clothes, rinse your clothes twice so all of the soap is removed. Avoid any triggers that can cause a flare-up. Keep your fingernails cut short. Avoid scratching. Scratching makes the rash and itchiness worse. A break in the skin from scratching could result in a skin infection (impetigo). Do not be around people who have cold sores or fever blisters. If you get the infection, it may cause your atopic dermatitis to worsen. Keep all follow-up visits. This is important. Contact a health care provider if: Your itchiness interferes with sleep. Your rash gets worse or is not better within one week of starting treatment. You have a fever. You have a rash flare-up after  having contact with someone who has cold sores or fever blisters. Get help right away if: You develop pus or soft yellow scabs in the rash area. Summary Atopic dermatitis causes a red rash and itchy, dry, scaly skin. Treatment focuses  on controlling the itchiness and scratching, limiting exposure to things that you are sensitive or allergic to (allergens), recognizing situations that cause stress, and developing a plan to manage stress. Keep your skin well moisturized. Keep baths or showers shorter than 5 minutes and use warm water. Do not use hot water. This information is not intended to replace advice given to you by your health care provider. Make sure you discuss any questions you have with your health care provider. Document Revised: 08/01/2020 Document Reviewed: 08/01/2020 Elsevier Patient Education  2023 [employer].

## 2023-04-23 NOTE — Progress Notes (Signed)
Assessment & Plan:  Rash Assessment & Plan: Rash is not present today.  Based on description, suspect atopic dermatitis.  Treating with low potency triamcinolone and counseled on limiting use to less than 7 days due to atrophy.  Start Claritin .Alternatively discussed rosacea.  Counseled on trial of stopping all cosmetics to see if there is a product aggravating.  Orders: -     Triamcinolone Acetonide; Apply 1 Application topically 2 (two) times daily. Limit to < 7 days  Dispense: 80 g; Refill: 2 -     Loratadine; Take 1 tablet (10 mg total) by mouth daily.  Dispense: 30 tablet; Refill: 11     Return precautions given.   Risks, benefits, and alternatives of the medications and treatment plan prescribed today were discussed, and patient expressed understanding.   Education regarding symptom management and diagnosis given to patient on AVS either electronically or printed.  Return in about 6 months (around 10/23/2023).  Rennie Plowman, FNP  Subjective:    Patient ID: Connie Fernandez, female    DOB: 1996-10-19, 27 y.o.   MRN: [id number]  CC: Connie Fernandez is a 27 y.o. female who presents today for an acute visit.    HPI: Accompanied by mother today Complains of 'crusty flakey itchy rash' under both eyes, x 2 months ago  Resolved today. Occurred 2 days ago   No associated with eye pain, eye swelling, vision change, sneezing, ear pain, papules.   Using Cetaphil to wash face.   Using same make up as she always had.   Using Vaseline with temporarily relief     She is using Atarax 10 mg once daily.  She has noticed when taking facial itchiness improves  Allergies: Latex and Penicillins Current Outpatient Medications on File Prior to Visit  Medication Sig Dispense Refill   hydrOXYzine (ATARAX) 10 MG tablet Take 1-2 tablets (10-20 mg total) by mouth 2 (two) times daily as needed. for anxiety 90 tablet 1   meloxicam (MOBIC) 15 MG tablet Take 1 tablet (15 mg total) by  mouth daily. (Patient not taking: Reported on 04/23/2023) 30 tablet 2   No current facility-administered medications on file prior to visit.    Review of Systems  Constitutional:  Negative for chills and fever.  Respiratory:  Negative for cough.   Cardiovascular:  Negative for chest pain and palpitations.  Gastrointestinal:  Negative for nausea and vomiting.  Skin:  Positive for rash (none today).      Objective:    BP 116/76   Pulse 75   Temp 97.9 F (36.6 C) (Oral)   Ht 5\' 2"  (1.575 m)   Wt 157 lb 9.6 oz (71.5 kg)   SpO2 99%   BMI 28.83 kg/m   BP Readings from Last 3 Encounters:  04/23/23 116/76  04/09/23 122/72  03/12/23 118/70   Wt Readings from Last 3 Encounters:  04/23/23 157 lb 9.6 oz (71.5 kg)  04/09/23 153 lb (69.4 kg)  03/12/23 159 lb 3.2 oz (72.2 kg)    Physical Exam Vitals reviewed.  Constitutional:      Appearance: She is well-developed.  Eyes:     General: Lids are normal.        Right eye: No discharge or hordeolum.        Left eye: No discharge or hordeolum.     Conjunctiva/sclera: Conjunctivae normal.  Cardiovascular:     Rate and Rhythm: Normal rate and regular rhythm.     Pulses: Normal pulses.  Heart sounds: Normal heart sounds.  Pulmonary:     Effort: Pulmonary effort is normal.     Breath sounds: Normal breath sounds. No wheezing, rhonchi or rales.  Skin:    General: Skin is warm and dry.     Findings: Rash is not crusting, macular or scaling.     Comments: No papular or vesicular lesions . No  telangiectasia present.   Neurological:     Mental Status: She is alert.  Psychiatric:        Speech: Speech normal.        Behavior: Behavior normal.        Thought Content: Thought content normal.   Due to atrophy

## 2023-07-02 ENCOUNTER — Emergency Department
Admission: EM | Admit: 2023-07-02 | Discharge: 2023-07-02 | Disposition: A | Discharge status: Home / Self Care | Payer: Managed Care, Other (non HMO) | Attending: Student in an Organized Health Care Education/Training Program | Admitting: Student in an Organized Health Care Education/Training Program

## 2023-07-02 ENCOUNTER — Other Ambulatory Visit: Payer: Self-pay

## 2023-07-02 DIAGNOSIS — U071 COVID-19: Secondary | ICD-10-CM | POA: Insufficient documentation

## 2023-07-02 DIAGNOSIS — R059 Cough, unspecified: Secondary | ICD-10-CM | POA: Diagnosis present

## 2023-07-02 LAB — SARS CORONAVIRUS 2 BY RT PCR: SARS Coronavirus 2 by RT PCR: POSITIVE — AB

## 2023-07-02 MED ORDER — BENZONATATE 100 MG PO CAPS: 100.0000 mg | ORAL_CAPSULE | Freq: Three times a day (TID) | ORAL | 0 refills | Status: DC | PRN

## 2023-07-02 MED ORDER — ONDANSETRON HCL 4 MG PO TABS: 4.0000 mg | ORAL_TABLET | Freq: Every day | ORAL | 1 refills | Status: DC | PRN

## 2023-07-02 NOTE — ED Triage Notes (Signed)
Pt here with generalized body aches. Pt states she has been exposed to covid.

## 2023-07-02 NOTE — ED Provider Notes (Signed)
[facility] Emergency Department Provider Note     Event Date/Time   First MD Initiated Contact with Patient 07/02/23 1217     (approximate)   History   Generalized Body Aches   HPI  Connie Fernandez is a 27 y.o. female with no significant past medical history presents to the ED for generalized body aches x 2 days.  Associated symptoms include headache, nausea and cough.  Patient endorses sick contacts.  Patients family is currently positive for COVID.  Patient has tried DayQuil with minimal relief.  Endorses loss of appetite.  Denies fever, shortness of breath and chest pain.     Physical Exam   Triage Vital Signs: ED Triage Vitals  Encounter Vitals Group     BP 07/02/23 1147 124/79     Systolic BP Percentile --      Diastolic BP Percentile --      Pulse Rate 07/02/23 1147 91     Resp 07/02/23 1147 16     Temp 07/02/23 1147 98.3 F (36.8 C)     Temp Source 07/02/23 1147 Oral     SpO2 07/02/23 1147 100 %     Weight 07/02/23 1148 157 lb 10.1 oz (71.5 kg)     Height 07/02/23 1148 5\' 2"  (1.575 m)     Head Circumference --      Peak Flow --      Pain Score 07/02/23 1148 9     Pain Loc --      Pain Education --      Exclude from Growth Chart --     Most recent vital signs: Vitals:   07/02/23 1147  BP: 124/79  Pulse: 91  Resp: 16  Temp: 98.3 F (36.8 C)  SpO2: 100%    General Awake, no distress.  Nontoxic. HEENT NCAT. PERRL. EOMI. No rhinorrhea. Mucous membranes are moist.  CV:  Good peripheral perfusion.  RRR RESP:  Normal effort.  LCTAB.  No wheezing or rhonchi.  No retractions or accessory muscle use. ABD:  No distention.   ED Results / Procedures / Treatments   Labs (all labs ordered are listed, but only abnormal results are displayed) Labs Reviewed  SARS CORONAVIRUS 2 BY RT PCR - Abnormal; Notable for the following components:      Result Value   SARS Coronavirus 2 by RT PCR POSITIVE (*)    All other components within  normal limits   No results found.  PROCEDURES:  Critical Care performed: No  Procedures   MEDICATIONS ORDERED IN ED: Medications - No data to display   IMPRESSION / MDM / ASSESSMENT AND PLAN / ED COURSE  I reviewed the triage vital signs and the nursing notes.                                27 y.o. female presents to the emergency department for evaluation and treatment of acute generalized bodyaches. See HPI for further details.   Differential diagnosis includes, but is not limited to COVID, acute bronchitis, allergic rhinitis.  Patient's presentation is most consistent with acute complicated illness / injury requiring diagnostic workup.  Vital signs are stable and within normal limits.  Given patient's history of known sick contact exposure and positive COVID test, patient's presentation is clinically consistent with a viral illness.  Encourage patient to follow CDC guidelines for quarantine and isolation protocol.  Zofran and Tessalon Perles sent to  pharmacy for symptomatic care.  Patient is stable at discharge home. Patient is given ED precautions to return to the ED for any worsening or new symptoms. Patient verbalizes understanding. All questions and concerns were addressed during ED visit.    FINAL CLINICAL IMPRESSION(S) / ED DIAGNOSES   Final diagnoses:  COVID   Rx / DC Orders   ED Discharge Orders          Ordered    ondansetron (ZOFRAN) 4 MG tablet  Daily PRN        07/02/23 1246    benzonatate (TESSALON PERLES) 100 MG capsule  3 times daily PRN        07/02/23 1246            Note:  This document was prepared using Dragon voice recognition software and may include unintentional dictation errors.    Romeo Apple, Donato Studley A, PA-C 07/02/23 1254    Willy Eddy, MD 07/02/23 1344

## 2023-07-02 NOTE — Discharge Instructions (Addendum)
You were evaluated in the ED today for generalized bodyaches.  Your COVID test is positive.  You will need to follow quarantine and isolation protocol per CDC guidelines.  This is provided for you.  It is encouraged that you rest and stay hydrated drinking plenty of fluids.  Please pick up your prescriptions from the pharmacy.

## 2023-07-04 ENCOUNTER — Telehealth: Payer: Self-pay

## 2023-07-04 NOTE — Transitions of Care (Post Inpatient/ED Visit) (Signed)
Unable to reach pt by phone and left v/m requesting cb [phone].        07/04/2023  Name: Connie Fernandez MRN: [id number] DOB: 03/28/1996  Today's TOC FU Call Status: Today's TOC FU Call Status:: Unsuccessful Call (1st Attempt) Unsuccessful Call (1st Attempt) Date: 07/04/23  Attempted to reach the patient regarding the most recent Inpatient/ED visit.  Follow Up Plan: Additional outreach attempts will be made to reach the patient to complete the Transitions of Care (Post Inpatient/ED visit) call.   Signature  Lewanda Rife, LPN

## 2023-08-01 ENCOUNTER — Ambulatory Visit (INDEPENDENT_AMBULATORY_CARE_PROVIDER_SITE_OTHER): Payer: Managed Care, Other (non HMO) | Admitting: Family

## 2023-08-01 ENCOUNTER — Encounter: Payer: Self-pay | Admitting: Family

## 2023-08-01 VITALS — BP 118/70 | HR 70 | Temp 97.8°F | Resp 16 | Ht 62.0 in | Wt 144.0 lb

## 2023-08-01 DIAGNOSIS — Z3A01 Less than 8 weeks gestation of pregnancy: Secondary | ICD-10-CM

## 2023-08-01 DIAGNOSIS — Z3201 Encounter for pregnancy test, result positive: Secondary | ICD-10-CM | POA: Diagnosis not present

## 2023-08-01 DIAGNOSIS — F17200 Nicotine dependence, unspecified, uncomplicated: Secondary | ICD-10-CM | POA: Diagnosis not present

## 2023-08-01 DIAGNOSIS — R21 Rash and other nonspecific skin eruption: Secondary | ICD-10-CM | POA: Diagnosis not present

## 2023-08-01 DIAGNOSIS — Z3490 Encounter for supervision of normal pregnancy, unspecified, unspecified trimester: Secondary | ICD-10-CM | POA: Insufficient documentation

## 2023-08-01 MED ORDER — NICOTINE 7 MG/24HR TD PT24
7.0000 mg | MEDICATED_PATCH | Freq: Every day | TRANSDERMAL | 0 refills | Status: DC
Start: 2023-08-01 — End: 2023-10-14

## 2023-08-01 MED ORDER — TRIAMCINOLONE ACETONIDE 0.025 % EX CREA
1.0000 | TOPICAL_CREAM | Freq: Two times a day (BID) | CUTANEOUS | 0 refills | Status: DC
Start: 2023-08-01 — End: 2023-10-14

## 2023-08-01 NOTE — Patient Instructions (Addendum)
Start nicotine 7mg  patch today  VAPE HALF as much as you previously did , starting today, for one week.   The following week, stop vaping and then may use the nicotine patch for next week , if needed. You will then stop using nicotine patch.   Please continue to slowly wean off of kratom until you completely stop use of kratom.   Start prenatal vitamin   I am very happy for you and you will be an amazing mother.

## 2023-08-01 NOTE — Progress Notes (Signed)
Assessment & Plan:  Positive urine pregnancy test -     hCG, serum, qualitative  Less than [redacted] weeks gestation of pregnancy Assessment & Plan: Urine pregnancy test at home has been positive.  Patient is scheduled to see GYN.  We discussed hydroxyzine and that first generation antihistamines are contraindicated in first trimester pregnancy.  I have also advised not to use  hydroxyzine or Benadryl at this time unless otherwise advised by GYN.  We discussed risk and benefit of BuSpar and lack of data to support safety in pregnancy.  We also discussed Zoloft to be used potentially for anxiety and risk of untreated anxiety and pregnancy outcomes.  Patient politely declines Zoloft at this time for anxiety. Patient is starting a prenatal vitamin today.  We discussed the use of vaping, kratom.  Discussed vaping is well regulated and it difficult to ascertain how much nicotine she is actually getting.  She is highly motivated to quit and we agreed to start with the lowest nicotine patch of 7 mg.  Counseled on how to decrease vaping while starting nicotine patch.  Ideally patient will quit vaping and nicotine patch altogether in 2 weeks time.  Discussed risk of low birthweight infant due to nicotine. Concern with kratom and opioid withdrawal symptoms. Patient has already self tapered , currently using 1/3 of prior dose.  I reiterated the importance of slowly tapering off completely kratom due to withdrawal risk to baby ( cited from [employer] 05/31/2018) with the herbal supplement.  Patient was very much in agreement with this and highly motivated to do so. I printed fact sheets for [email or web address]   Tobacco use disorder -     Nicotine; Place 1 patch (7 mg total) onto the skin daily.  Dispense: 28 patch; Refill: 0  Rash Assessment & Plan: Presentation consistent with atopic dermatitis.  Trial of triamcinolone.  Patient will let me know if no improvement.   Orders: -     Triamcinolone Acetonide; Apply 1  Application topically 2 (two) times daily.  Dispense: 15 g; Refill: 0     Return precautions given.   Risks, benefits, and alternatives of the medications and treatment plan prescribed today were discussed, and patient expressed understanding.   Education regarding symptom management and diagnosis given to patient on AVS either electronically or printed.  No follow-ups on file.  Rennie Plowman, FNP  I have spent 25 minutes with a patient including precharting, exam, reviewing medical records, reviewing [email or web address] and discussion of medications in pregnancy and plan of care.     Subjective:    Patient ID: Connie Fernandez, female    DOB: 05-02-1996, 27 y.o.   MRN: [id number]  CC: Connie Fernandez is a 27 y.o. female who presents today for an acute visit.    HPI: Accompanied by mom She had positive urine pregnancy 2 weeks ago  She has always wanted to be a mother.  She is in a stable relationship with a partner of 4 years.  She does have some anxiety particularly as she worries about vaping, kratom use, and pregnancy in general.  Anxiety primarily affects her falling asleep.  Denies depression   Scheduled to see Dr Macon Large 08/21/23  LMP 06/18/23  COVID 07/02/23  Rash left side of stomach x 3 weeks, unchanged.  She hasn't tried anything for this.  Non itchy.   She is vaping , uses one cartilage every 3-4 days.  She states if she were smoking, she would smoke  7 cigarettes a  day.  She had been using kratom , 3 tablets daily, and has slowly weaned down to 1 tablet throughout the day since she found she was pregnant.    Allergies: Latex and Penicillins No current outpatient medications on file prior to visit.   No current facility-administered medications on file prior to visit.    Review of Systems  Constitutional:  Negative for chills and fever.  Respiratory:  Negative for cough.   Cardiovascular:  Negative for chest pain and palpitations.  Gastrointestinal:   Negative for nausea and vomiting.      Objective:    BP 118/70   Pulse 70   Temp 97.8 F (36.6 C)   Resp 16   Ht 5\' 2"  (1.575 m)   Wt 144 lb (65.3 kg)   LMP 06/18/2023 (Exact Date)   SpO2 99%   BMI 26.34 kg/m   BP Readings from Last 3 Encounters:  08/01/23 118/70  07/02/23 124/79  04/23/23 116/76   Wt Readings from Last 3 Encounters:  08/01/23 144 lb (65.3 kg)  07/02/23 157 lb 10.1 oz (71.5 kg)  04/23/23 157 lb 9.6 oz (71.5 kg)    Physical Exam Vitals reviewed.  Constitutional:      Appearance: She is well-developed.  Eyes:     Conjunctiva/sclera: Conjunctivae normal.  Cardiovascular:     Rate and Rhythm: Normal rate and regular rhythm.     Pulses: Normal pulses.     Heart sounds: Normal heart sounds.  Pulmonary:     Effort: Pulmonary effort is normal.     Breath sounds: Normal breath sounds. No wheezing, rhonchi or rales.  Skin:    General: Skin is warm and dry.          Comments:  2 to 3 cm patch left middle abdomen.  No exudate.  Dry, scaly  Neurological:     Mental Status: She is alert.  Psychiatric:        Speech: Speech normal.        Behavior: Behavior normal.        Thought Content: Thought content normal.

## 2023-08-01 NOTE — Assessment & Plan Note (Addendum)
Urine pregnancy test at home has been positive.  Patient is scheduled to see GYN.  We discussed hydroxyzine and that first generation antihistamines are contraindicated in first trimester pregnancy.  I have also advised not to use  hydroxyzine or Benadryl at this time unless otherwise advised by GYN.  We discussed risk and benefit of BuSpar and lack of data to support safety in pregnancy.  We also discussed Zoloft to be used potentially for anxiety and risk of untreated anxiety and pregnancy outcomes.  Patient politely declines Zoloft at this time for anxiety. Patient is starting a prenatal vitamin today.  We discussed the use of vaping, kratom.  Discussed vaping is well regulated and it difficult to ascertain how much nicotine she is actually getting.  She is highly motivated to quit and we agreed to start with the lowest nicotine patch of 7 mg.  Counseled on how to decrease vaping while starting nicotine patch.  Ideally patient will quit vaping and nicotine patch altogether in 2 weeks time.  Discussed risk of low birthweight infant due to nicotine. Concern with kratom and opioid withdrawal symptoms. Patient has already self tapered , currently using 1/3 of prior dose.  I reiterated the importance of slowly tapering off completely kratom due to withdrawal risk to baby ( cited from [employer] 05/31/2018) with the herbal supplement.  Patient was very much in agreement with this and highly motivated to do so. I printed fact sheets for [email or web address]

## 2023-08-01 NOTE — Assessment & Plan Note (Signed)
Presentation consistent with atopic dermatitis.  Trial of triamcinolone.  Patient will let me know if no improvement.

## 2023-08-02 LAB — HCG, SERUM, QUALITATIVE: Preg, Serum: POSITIVE — AB

## 2023-08-03 ENCOUNTER — Encounter: Payer: Self-pay | Admitting: Family

## 2023-08-05 NOTE — Telephone Encounter (Signed)
Patient just called wanting to know if anyone got her MyChart message.

## 2023-08-06 NOTE — Telephone Encounter (Signed)
Spoke to pt she stated that she is not having anymore bleeding, no fever or syncope, some cramping but not as bad as it was will contact GYN if she deems necessary or go to ED

## 2023-08-12 ENCOUNTER — Encounter (HOSPITAL_COMMUNITY): Payer: Self-pay

## 2023-08-12 ENCOUNTER — Inpatient Hospital Stay (HOSPITAL_COMMUNITY): Payer: Managed Care, Other (non HMO)

## 2023-08-12 ENCOUNTER — Inpatient Hospital Stay (HOSPITAL_COMMUNITY)
Admission: AD | Admit: 2023-08-12 | Discharge: 2023-08-13 | Disposition: A | Discharge status: Home / Self Care | Payer: Managed Care, Other (non HMO) | Attending: Obstetrics and Gynecology | Admitting: Obstetrics and Gynecology

## 2023-08-12 DIAGNOSIS — N939 Abnormal uterine and vaginal bleeding, unspecified: Secondary | ICD-10-CM | POA: Diagnosis not present

## 2023-08-12 DIAGNOSIS — N760 Acute vaginitis: Secondary | ICD-10-CM | POA: Insufficient documentation

## 2023-08-12 DIAGNOSIS — Z3A08 8 weeks gestation of pregnancy: Secondary | ICD-10-CM | POA: Diagnosis not present

## 2023-08-12 DIAGNOSIS — O23591 Infection of other part of genital tract in pregnancy, first trimester: Secondary | ICD-10-CM | POA: Diagnosis not present

## 2023-08-12 DIAGNOSIS — O209 Hemorrhage in early pregnancy, unspecified: Secondary | ICD-10-CM | POA: Insufficient documentation

## 2023-08-12 DIAGNOSIS — F1190 Opioid use, unspecified, uncomplicated: Secondary | ICD-10-CM | POA: Insufficient documentation

## 2023-08-12 DIAGNOSIS — O26891 Other specified pregnancy related conditions, first trimester: Secondary | ICD-10-CM | POA: Diagnosis not present

## 2023-08-12 DIAGNOSIS — B9689 Other specified bacterial agents as the cause of diseases classified elsewhere: Secondary | ICD-10-CM | POA: Insufficient documentation

## 2023-08-12 DIAGNOSIS — R110 Nausea: Secondary | ICD-10-CM | POA: Diagnosis not present

## 2023-08-12 DIAGNOSIS — R1030 Lower abdominal pain, unspecified: Secondary | ICD-10-CM | POA: Diagnosis not present

## 2023-08-12 LAB — CBC
HCT: 36.1 % (ref 36.0–46.0)
Hemoglobin: 12.3 g/dL (ref 12.0–15.0)
MCH: 28.1 pg (ref 26.0–34.0)
MCHC: 34.1 g/dL (ref 30.0–36.0)
MCV: 82.4 fL (ref 80.0–100.0)
Platelets: 235 10*3/uL (ref 150–400)
RBC: 4.38 MIL/uL (ref 3.87–5.11)
RDW: 13.5 % (ref 11.5–15.5)
WBC: 9.8 10*3/uL (ref 4.0–10.5)
nRBC: 0 % (ref 0.0–0.2)

## 2023-08-12 LAB — URINALYSIS, ROUTINE W REFLEX MICROSCOPIC
Bilirubin Urine: NEGATIVE
Glucose, UA: NEGATIVE mg/dL
Ketones, ur: NEGATIVE mg/dL
Leukocytes,Ua: NEGATIVE
Nitrite: NEGATIVE
Protein, ur: NEGATIVE mg/dL
Specific Gravity, Urine: 1.023 (ref 1.005–1.030)
pH: 6 (ref 5.0–8.0)

## 2023-08-12 LAB — WET PREP, GENITAL
Sperm: NONE SEEN
Trich, Wet Prep: NONE SEEN
WBC, Wet Prep HPF POC: <10 (ref <10)
Yeast Wet Prep HPF POC: NONE SEEN

## 2023-08-12 LAB — ABO/RH: ABO/RH(D): B POS

## 2023-08-12 NOTE — MAU Note (Signed)
.  Connie Fernandez is a 27 y.o. at Unknown here in MAU reporting: started having vaginal bleeding last week, now is having light to moderate vaginal bleeding. Reports cramping that began last night. Took tylenol around 3 am and didn't help much.  LMP: 06/18/2023  Pain score: 7/10 Vitals:   08/12/23 2204  BP: 123/65  Pulse: 71  Resp: 14  Temp: 97.8 F (36.6 C)  SpO2: 100%     FHT:n/a Lab orders placed from triage:  UA

## 2023-08-12 NOTE — MAU Provider Note (Signed)
History     CSN: [id number]  Arrival date and time: 08/12/23 2151   None     Chief Complaint  Patient presents with   Abdominal Pain   Vaginal Bleeding   Connie Fernandez , a  27 y.o. G1P0000 at [redacted]w[redacted]d presents to MAU with complaints of vaginal bleeding and lower abdominal cramping. Patient reports that a week ago she noted 1 day of vaginal spotting without cramping. Then today at 3 am she noted some intense period like cramping that she attempted to relieve with 1 PO Tylenol. She had no relief. Currently rates pain a 6/10. She states that throughout the day she began having more bleeding and passing tiny clots. She reports bright red/brown vaginal bleeding but denies saturating a pad. She reports that last week she cold quit taking "Kratom" after being recommended to ween down. She states that today has been the first day that she has not been in withdrawal. She denies abnormal vaginal discharge or urinary symptoms. She reports last intercourse was >1 week ago.          OB History     Gravida  1   Para  0   Term  0   Preterm  0   AB  0   Living  0      SAB  0   IAB  0   Ectopic  0   Multiple  0   Live Births  0           Past Medical History:  Diagnosis Date   Anemia    Anxiety    Chronic pelvic pain in female    Had negative laparoscopy for endometriosis in 2017   Depression    Dysmenorrhea in adolescent    Headache    Migraines   Heart palpitations    Shortness of breath dyspnea    with exertion   UTI (lower urinary tract infection)     Past Surgical History:  Procedure Laterality Date   LAPAROSCOPY N/A 06/12/2016   Procedure: LAPAROSCOPY DIAGNOSTIC;  Surgeon: Allie Bossier, MD;  Location: [facility];  Service: Gynecology;  Laterality: N/A;    Family History  Problem Relation Age of Onset   Diabetes Mother    Alcohol abuse Father    Hypertension Father    COPD Maternal Grandmother    Early death Maternal Grandmother 1   Heart disease  Maternal Grandmother    Heart disease Maternal Grandfather 21   Parkinsonism Paternal Grandmother     Social History   Tobacco Use   Smoking status: Every Day    Current packs/day: 0.00    Types: E-cigarettes, Cigarettes   Smokeless tobacco: Never  Vaping Use   Vaping status: Every Day   Substances: Nicotine, Flavoring  Substance Use Topics   Alcohol use: Not Currently    Comment: few drinks per month.    Drug use: Not Currently    Comment: Just stopped Kratum last week    Allergies:  Allergies  Allergen Reactions   Latex Itching   Penicillins Hives    Has patient had a PCN reaction causing immediate rash, facial/tongue/throat swelling, SOB or lightheadedness with hypotension: Dizziness and shaky. Has patient had a PCN reaction causing severe rash involving mucus membranes or skin necrosis: NO Has patient had a PCN reaction that required hospitalization: NO Has patient had a PCN reaction occurring within the last 10 years: NO If all of the above answers are "NO", then may proceed with Cephalosporin  use.     Medications Prior to Admission  Medication Sig Dispense Refill Last Dose   nicotine (NICODERM CQ - DOSED IN MG/24 HR) 7 mg/24hr patch Place 1 patch (7 mg total) onto the skin daily. 28 patch 0 Unknown   triamcinolone (KENALOG) 0.025 % cream Apply 1 Application topically 2 (two) times daily. 15 g 0 Unknown    Review of Systems  Constitutional:  Negative for chills, fatigue and fever.  Eyes:  Negative for pain and visual disturbance.  Respiratory:  Negative for apnea, shortness of breath and wheezing.   Cardiovascular:  Negative for chest pain and palpitations.  Gastrointestinal:  Positive for abdominal pain. Negative for constipation, diarrhea, nausea and vomiting.  Genitourinary:  Positive for pelvic pain and vaginal bleeding. Negative for difficulty urinating, dysuria, vaginal discharge and vaginal pain.  Musculoskeletal:  Negative for back pain.  Neurological:   Negative for seizures, weakness and headaches.  Psychiatric/Behavioral:  Negative for suicidal ideas.    Physical Exam   Blood pressure 117/65, pulse 85, temperature 98.7 F (37.1 C), temperature source Oral, resp. rate 16, height 5\' 2"  (1.575 m), weight 64.9 kg, last menstrual period 06/18/2023, SpO2 99%.  Physical Exam Vitals and nursing note reviewed. Exam conducted with a chaperone present.  Constitutional:      General: She is not in acute distress.    Appearance: Normal appearance.  HENT:     Head: Normocephalic.  Pulmonary:     Effort: Pulmonary effort is normal.  Abdominal:     Palpations: Abdomen is soft.     Tenderness: There is no abdominal tenderness.  Genitourinary:    Vagina: Bleeding present.     Cervix: Discharge present.     Comments: Small Brown red vaginal discharge noted in vaginal vault and cleared with 2 fox swabs. Bleeding noted coming from cervical os.  Musculoskeletal:     Cervical back: Normal range of motion.  Skin:    General: Skin is warm and dry.  Neurological:     Mental Status: She is alert and oriented to person, place, and time.  Psychiatric:        Mood and Affect: Mood normal.     MAU Course  Procedures Orders Placed This Encounter  Procedures   Wet prep, genital   US OB LESS THAN 14 WEEKS WITH OB TRANSVAGINAL   Urinalysis, Routine w reflex microscopic -Urine, Clean Catch   CBC   hCG, quantitative, pregnancy   Diet NPO time specified   ABO/Rh   Results for orders placed or performed during the hospital encounter of 08/12/23 (from the past 24 hour(s))  CBC     Status: None   Collection Time: 08/12/23 10:52 PM  Result Value Ref Range   WBC 9.8 4.0 - 10.5 K/uL   RBC 4.38 3.87 - 5.11 MIL/uL   Hemoglobin 12.3 12.0 - 15.0 g/dL   HCT 96.0 45.4 - 09.8 %   MCV 82.4 80.0 - 100.0 fL   MCH 28.1 26.0 - 34.0 pg   MCHC 34.1 30.0 - 36.0 g/dL   RDW 11.9 14.7 - 82.9 %   Platelets 235 150 - 400 K/uL   nRBC 0.0 0.0 - 0.2 %  ABO/Rh      Status: None   Collection Time: 08/12/23 10:52 PM  Result Value Ref Range   ABO/RH(D) B POS    No rh immune globuloin      NOT A RH IMMUNE GLOBULIN CANDIDATE, PT RH POSITIVE Performed at [facility],  [address], [place], [place] [address]   hCG, quantitative, pregnancy     Status: Abnormal   Collection Time: 08/12/23 10:52 PM  Result Value Ref Range   hCG, Beta Chain, Quant, S 85,715 (H) <5 mIU/mL  Urinalysis, Routine w reflex microscopic -Urine, Clean Catch     Status: Abnormal   Collection Time: 08/12/23 11:04 PM  Result Value Ref Range   Color, Urine YELLOW YELLOW   APPearance HAZY (A) CLEAR   Specific Gravity, Urine 1.023 1.005 - 1.030   pH 6.0 5.0 - 8.0   Glucose, UA NEGATIVE NEGATIVE mg/dL   Hgb urine dipstick SMALL (A) NEGATIVE   Bilirubin Urine NEGATIVE NEGATIVE   Ketones, ur NEGATIVE NEGATIVE mg/dL   Protein, ur NEGATIVE NEGATIVE mg/dL   Nitrite NEGATIVE NEGATIVE   Leukocytes,Ua NEGATIVE NEGATIVE   RBC / HPF 0-5 0 - 5 RBC/hpf   WBC, UA 0-5 0 - 5 WBC/hpf   Bacteria, UA RARE (A) NONE SEEN   Squamous Epithelial / HPF 11-20 0 - 5 /HPF   Mucus PRESENT   Wet prep, genital     Status: Abnormal   Collection Time: 08/12/23 11:04 PM  Result Value Ref Range   Yeast Wet Prep HPF POC NONE SEEN NONE SEEN   Trich, Wet Prep NONE SEEN NONE SEEN   Clue Cells Wet Prep HPF POC PRESENT (A) NONE SEEN   WBC, Wet Prep HPF POC <10 <10   Sperm NONE SEEN    US OB Comp Less 14 Wks  Result Date: 08/13/2023 CLINICAL DATA:  Vaginal bleeding in pregnancy. LMP: 06/18/2023 corresponding to an estimated gestational age of [redacted] weeks, 6 days. EXAM: OBSTETRIC <14 WK ULTRASOUND TECHNIQUE: Transabdominal ultrasound was performed for evaluation of the gestation as well as the maternal uterus and adnexal regions. COMPARISON:  None Available. FINDINGS: Intrauterine gestational sac: Single intrauterine gestational sac. Yolk sac:  Seen Embryo:  Present Cardiac Activity: Detected Heart Rate: 133 bpm  CRL:   10 mm   7 w 0 d                  [place] EDC: 03/30/2024 Subchorionic hemorrhage:  None visualized. Maternal uterus/adnexae: The maternal ovaries are unremarkable. IMPRESSION: Single live intrauterine pregnancy with an estimated gestational age of [redacted] weeks, 0 days based on ultrasound. Electronically Signed   By: Elgie Collard M.D.   On: 08/13/2023 00:10    MDM - Wet prep positive for BV  - GC pending prior to discharge  - UA hazy and positive for Bacteria and blood.  - Quant > 85000 consistent  with LMP for 6-8 weeks.  - [place] results revealed a single living IUP measuring about [redacted]w[redacted]d consistent with LMP. No SCH noted.  - Plan for discharge.   Assessment and Plan   1. Vaginal bleeding   2. [redacted] weeks gestation of pregnancy   3. BV (bacterial vaginosis)   4. Nausea    - Reviewed that vaginal infections like BV can cause abdominal discomfort and vaginal bleeding.  - Rx for BV sent to outpatient pharmacy for pick up.  - Reviewed bleeding expectations and return precautions.  - Recommended that patient seek Prenatal care at a facility of her choosing.  - Patient discharged home in stable condition and may return to MAU as needed.   Claudette Head, MSN CNM  08/12/2023, 10:45 PM

## 2023-08-13 DIAGNOSIS — N939 Abnormal uterine and vaginal bleeding, unspecified: Secondary | ICD-10-CM | POA: Diagnosis not present

## 2023-08-13 DIAGNOSIS — B9689 Other specified bacterial agents as the cause of diseases classified elsewhere: Secondary | ICD-10-CM

## 2023-08-13 DIAGNOSIS — Z3A08 8 weeks gestation of pregnancy: Secondary | ICD-10-CM

## 2023-08-13 DIAGNOSIS — N760 Acute vaginitis: Secondary | ICD-10-CM | POA: Diagnosis not present

## 2023-08-13 DIAGNOSIS — F1190 Opioid use, unspecified, uncomplicated: Secondary | ICD-10-CM | POA: Insufficient documentation

## 2023-08-13 LAB — GC/CHLAMYDIA PROBE AMP (~~LOC~~) NOT AT ARMC
Chlamydia: NEGATIVE
Comment: NEGATIVE
Comment: NORMAL
Neisseria Gonorrhea: NEGATIVE

## 2023-08-13 LAB — HCG, QUANTITATIVE, PREGNANCY: hCG, Beta Chain, Quant, S: 85715 m[IU]/mL — ABNORMAL HIGH (ref <5)

## 2023-08-13 MED ORDER — ONDANSETRON 4 MG PO TBDP: 4.0000 mg | ORAL_TABLET | Freq: Three times a day (TID) | ORAL | 0 refills | Status: DC | PRN

## 2023-08-13 MED ORDER — METRONIDAZOLE 500 MG PO TABS: 500.0000 mg | ORAL_TABLET | Freq: Two times a day (BID) | ORAL | 0 refills | Status: DC

## 2023-09-16 ENCOUNTER — Encounter: Payer: Self-pay | Admitting: Obstetrics & Gynecology

## 2023-09-16 ENCOUNTER — Ambulatory Visit: Payer: Managed Care, Other (non HMO) | Admitting: Obstetrics & Gynecology

## 2023-09-16 VITALS — BP 128/69 | HR 98 | Wt 136.0 lb

## 2023-09-16 DIAGNOSIS — F419 Anxiety disorder, unspecified: Secondary | ICD-10-CM

## 2023-09-16 DIAGNOSIS — Z2839 Other underimmunization status: Secondary | ICD-10-CM

## 2023-09-16 DIAGNOSIS — O219 Vomiting of pregnancy, unspecified: Secondary | ICD-10-CM | POA: Diagnosis not present

## 2023-09-16 DIAGNOSIS — Z3400 Encounter for supervision of normal first pregnancy, unspecified trimester: Secondary | ICD-10-CM | POA: Insufficient documentation

## 2023-09-16 DIAGNOSIS — O99341 Other mental disorders complicating pregnancy, first trimester: Secondary | ICD-10-CM

## 2023-09-16 DIAGNOSIS — Z3A12 12 weeks gestation of pregnancy: Secondary | ICD-10-CM | POA: Diagnosis not present

## 2023-09-16 DIAGNOSIS — Z1339 Encounter for screening examination for other mental health and behavioral disorders: Secondary | ICD-10-CM

## 2023-09-16 MED ORDER — ASPIRIN 81 MG PO TBEC: 81.0000 mg | DELAYED_RELEASE_TABLET | Freq: Every day | ORAL | 2 refills | Status: DC

## 2023-09-16 MED ORDER — ONDANSETRON 4 MG PO TBDP: 4.0000 mg | ORAL_TABLET | Freq: Three times a day (TID) | ORAL | 2 refills | Status: DC | PRN

## 2023-09-16 MED ORDER — HYDROXYZINE HCL 25 MG PO TABS: 25.0000 mg | ORAL_TABLET | Freq: Four times a day (QID) | ORAL | 10 refills | Status: DC | PRN

## 2023-09-16 NOTE — Progress Notes (Signed)
History:   Connie Fernandez is a 27 y.o. G1P0000 at [redacted]w[redacted]d by 7 week ultrasound not consistent with LMP being seen today for her first obstetrical visit.  Accompanied by her mother. Patient does intend to breast feed. Pregnancy history fully reviewed.  History of anxiety, was on Hydroxyzine, but stopped this.  Recently, not able to sleep, has episodes of heart palpitations and increased anxiety.  Also has nausea and vomiting, wants refill of Zofran.  No other concerns.      HISTORY: OB History  Gravida Para Term Preterm AB Living  1 0 0 0 0 0  SAB IAB Ectopic Multiple Live Births  0 0 0 0 0    # Outcome Date GA Lbr Len/2nd Weight Sex Type Anes PTL Lv  1 Current             Last pap smear was done 2023 and was normal  Past Medical History:  Diagnosis Date   Acne 02/19/2017   Acute cystitis with hematuria 07/25/2017   Anemia    Anxiety    Chronic pelvic pain in female    Had negative laparoscopy for endometriosis in 2017   Depression    Dysmenorrhea 02/19/2017   Gastritis 08/01/2021   Headache    Migraines   Heart palpitations    History of drug use 07/25/2017   Opioid use disorder, mild, abuse (HCC) 01/19/2020   Pelvic floor dysfunction 10/05/2016   Primary dysmenorrhea 10/05/2016   Negative laparoscopy for endometriosis     Tobacco use disorder 10/28/2015   Past Surgical History:  Procedure Laterality Date   LAPAROSCOPY N/A 06/12/2016   Procedure: LAPAROSCOPY DIAGNOSTIC;  Surgeon: Allie Bossier, MD;  Location: [facility];  Service: Gynecology;  Laterality: N/A;   Family History  Problem Relation Age of Onset   Diabetes Mother    Alcohol abuse Father    Hypertension Father    COPD Maternal Grandmother    Early death Maternal Grandmother 63   Heart disease Maternal Grandmother    Heart disease Maternal Grandfather 71   Parkinsonism Paternal Grandmother    Social History   Tobacco Use   Smoking status: Every Day    Current packs/day: 0.00    Types: E-cigarettes,  Cigarettes   Smokeless tobacco: Never  Vaping Use   Vaping status: Every Day   Substances: Nicotine, Flavoring  Substance Use Topics   Alcohol use: Not Currently    Comment: few drinks per month.    Drug use: Not Currently    Comment: Just stopped Kratum last week   Allergies  Allergen Reactions   Latex Itching   Penicillins Hives    Has patient had a PCN reaction causing immediate rash, facial/tongue/throat swelling, SOB or lightheadedness with hypotension: Dizziness and shaky. Has patient had a PCN reaction causing severe rash involving mucus membranes or skin necrosis: NO Has patient had a PCN reaction that required hospitalization: NO Has patient had a PCN reaction occurring within the last 10 years: NO If all of the above answers are "NO", then may proceed with Cephalosporin use.    Current Outpatient Medications on File Prior to Visit  Medication Sig Dispense Refill   metroNIDAZOLE (FLAGYL) 500 MG tablet Take 1 tablet (500 mg total) by mouth 2 (two) times daily. (Patient not taking: Reported on 09/16/2023) 14 tablet 0   nicotine (NICODERM CQ - DOSED IN MG/24 HR) 7 mg/24hr patch Place 1 patch (7 mg total) onto the skin daily. (Patient not taking: Reported on 09/16/2023)  28 patch 0   triamcinolone (KENALOG) 0.025 % cream Apply 1 Application topically 2 (two) times daily. (Patient not taking: Reported on 09/16/2023) 15 g 0   No current facility-administered medications on file prior to visit.    Review of Systems Pertinent items noted in HPI and remainder of comprehensive ROS otherwise negative.  Physical Exam:   Vitals:   09/16/23 1456  BP: 128/69  Pulse: 98  Weight: 136 lb (61.7 kg)   Fetal Heart Rate (bpm): 153   General: well-developed, well-nourished female in no acute distress  Breasts:  deferred  Skin: normal coloration and turgor, no rashes  Neurologic: oriented, normal, negative, normal mood  Extremities: normal strength, tone, and muscle mass, ROM of all  joints is normal  HEENT PERRLA, extraocular movement intact and sclera clear, anicteric  Neck supple and no masses  Cardiovascular: regular rate and rhythm  Respiratory:  no respiratory distress, normal breath sounds  Abdomen: soft, non-tender; bowel sounds normal; no masses,  no organomegaly  Pelvic: deferred     Assessment:    Pregnancy: G1P0000 Patient Active Problem List   Diagnosis Date Noted   Bipolar 2 disorder (HCC) 01/19/2020   Opioid use disorder, mild, abuse (HCC) 01/19/2020   History of drug use 07/25/2017   Anxiety 10/28/2015   Major depressive disorder, recurrent episode (HCC) 10/28/2015     Plan:    1. Nausea and vomiting during pregnancy Zofran refilled. - ondansetron (ZOFRAN-ODT) 4 MG disintegrating tablet; Take 1 tablet (4 mg total) by mouth every 8 (eight) hours as needed for nausea or vomiting.  Dispense: 30 tablet; Refill: 2  2. Anxiety during pregnancy in first trimester, antepartum Atarax refilled. If heart symptoms continue, will refer to Cardio Obstetriccs. - hydrOXYzine (ATARAX) 25 MG tablet; Take 1 tablet (25 mg total) by mouth every 6 (six) hours as needed for anxiety (insomnia).  Dispense: 30 tablet; Refill: 10  3. [redacted] weeks gestation of pregnancy 4. Supervision of normal first pregnancy, antepartum - CBC/D/Plt+RPR+Rh+ABO+RubIgG... - Culture, OB Urine - [place] MFM OB COMP + 14 WK; Future - PANORAMA PRENATAL TEST - HORIZON CUSTOM - aspirin EC 81 MG tablet; Take 1 tablet (81 mg total) by mouth at bedtime. Start taking when you are [redacted] weeks pregnant for rest of pregnancy for prevention of preeclampsia  Dispense: 300 tablet; Refill: 2 - Hemoglobin A1c - Comprehensive metabolic panel  Initial labs drawn. Continue prenatal vitamins. Problem list reviewed and updated. Genetic Screening discussed, Panorama and Horizon: ordered. Ultrasound discussed; fetal anatomic survey: planned. Anticipatory guidance about prenatal visits given including labs,  ultrasounds, and testing. Weight gain recommendations per IOM guidelines reviewed: underweight/BMI 18.5 or less > 28 - 40 lbs; normal weight/BMI 18.5 - 24.9 > 25 - 35 lbs; overweight/BMI 25 - 29.9 > 15 - 25 lbs; obese/BMI  30 or more > 11 - 20 lbs. Discussed usage of the Babyscripts app for more information about pregnancy, and to track blood pressures. Also discussed usage of virtual visits as additional source of managing and completing prenatal visits.  Patient was encouraged to use MyChart to review results, send requests, and have questions addressed.   The nature of Center for [facility]Faculty Practice with multiple MDs and Advanced Practice Providers was explained to patient; also emphasized that residents, students are part of our team. Routine obstetric precautions reviewed. Encouraged to seek out care at our office or emergency room [facility] MAU preferred) for urgent and/or emergent concerns. Return in about 4 weeks (around 10/14/2023) for OFFICE OB  VISIT (MD only).     Jaynie Collins, MD, FACOG Obstetrician & Gynecologist, [facility] for [employer], [facility]

## 2023-09-17 DIAGNOSIS — Z2839 Other underimmunization status: Secondary | ICD-10-CM | POA: Insufficient documentation

## 2023-09-17 LAB — CBC/D/PLT+RPR+RH+ABO+RUBIGG...
Antibody Screen: NEGATIVE
Basophils Absolute: 0.1 10*3/uL (ref 0.0–0.2)
Basos: 1 %
EOS (ABSOLUTE): 0 10*3/uL (ref 0.0–0.4)
Eos: 0 %
HCV Ab: NONREACTIVE
HIV Screen 4th Generation wRfx: NONREACTIVE
Hematocrit: 40.6 % (ref 34.0–46.6)
Hemoglobin: 13.3 g/dL (ref 11.1–15.9)
Hepatitis B Surface Ag: NEGATIVE
Immature Grans (Abs): 0 10*3/uL (ref 0.0–0.1)
Immature Granulocytes: 0 %
Lymphocytes Absolute: 1.2 10*3/uL (ref 0.7–3.1)
Lymphs: 18 %
MCH: 29.1 pg (ref 26.6–33.0)
MCHC: 32.8 g/dL (ref 31.5–35.7)
MCV: 89 fL (ref 79–97)
Monocytes Absolute: 0.3 10*3/uL (ref 0.1–0.9)
Monocytes: 5 %
Neutrophils Absolute: 4.8 10*3/uL (ref 1.4–7.0)
Neutrophils: 76 %
Platelets: 260 10*3/uL (ref 150–450)
RBC: 4.57 x10E6/uL (ref 3.77–5.28)
RDW: 14.5 % (ref 11.7–15.4)
RPR Ser Ql: NONREACTIVE
Rh Factor: POSITIVE
Rubella Antibodies, IGG: <0.9 {index} — ABNORMAL LOW (ref >0.99)
WBC: 6.3 10*3/uL (ref 3.4–10.8)

## 2023-09-17 LAB — COMPREHENSIVE METABOLIC PANEL
ALT: 9 [IU]/L (ref 0–32)
AST: 15 [IU]/L (ref 0–40)
Albumin: 4.4 g/dL (ref 4.0–5.0)
Alkaline Phosphatase: 45 [IU]/L (ref 44–121)
BUN/Creatinine Ratio: 5 — ABNORMAL LOW (ref 9–23)
BUN: 4 mg/dL — ABNORMAL LOW (ref 6–20)
Bilirubin Total: 0.4 mg/dL (ref 0.0–1.2)
CO2: 19 mmol/L — ABNORMAL LOW (ref 20–29)
Calcium: 9.6 mg/dL (ref 8.7–10.2)
Chloride: 104 mmol/L (ref 96–106)
Creatinine, Ser: 0.73 mg/dL (ref 0.57–1.00)
Globulin, Total: 2.3 g/dL (ref 1.5–4.5)
Glucose: 91 mg/dL (ref 70–99)
Potassium: 4.8 mmol/L (ref 3.5–5.2)
Sodium: 139 mmol/L (ref 134–144)
Total Protein: 6.7 g/dL (ref 6.0–8.5)
eGFR: 116 mL/min/{1.73_m2} (ref >59)

## 2023-09-17 LAB — HCV INTERPRETATION

## 2023-09-17 LAB — HEMOGLOBIN A1C
Est. average glucose Bld gHb Est-mCnc: 108 mg/dL
Hgb A1c MFr Bld: 5.4 % (ref 4.8–5.6)

## 2023-09-18 LAB — CULTURE, OB URINE

## 2023-09-18 LAB — URINE CULTURE, OB REFLEX

## 2023-09-19 ENCOUNTER — Encounter: Payer: Self-pay | Admitting: *Deleted

## 2023-09-24 LAB — PANORAMA PRENATAL TEST FULL PANEL:PANORAMA TEST PLUS 5 ADDITIONAL MICRODELETIONS: FETAL FRACTION: 11.7

## 2023-09-25 LAB — HORIZON CUSTOM: REPORT SUMMARY: NEGATIVE

## 2023-10-14 ENCOUNTER — Ambulatory Visit (INDEPENDENT_AMBULATORY_CARE_PROVIDER_SITE_OTHER): Payer: Managed Care, Other (non HMO) | Admitting: Obstetrics & Gynecology

## 2023-10-14 VITALS — BP 113/72 | HR 87 | Wt 133.8 lb

## 2023-10-14 DIAGNOSIS — Z3400 Encounter for supervision of normal first pregnancy, unspecified trimester: Secondary | ICD-10-CM

## 2023-10-14 DIAGNOSIS — Z3A16 16 weeks gestation of pregnancy: Secondary | ICD-10-CM

## 2023-10-14 NOTE — Progress Notes (Signed)
   PRENATAL VISIT NOTE  Subjective:  Connie Fernandez is a 27 y.o. G1P0000 at [redacted]w[redacted]d being seen today for ongoing prenatal care.  She is currently monitored for the following issues for this low-risk pregnancy and has Anxiety; Major depressive disorder, recurrent episode (HCC); History of drug use; Bipolar 2 disorder (HCC); Opioid use disorder, mild, abuse (HCC); Supervision of normal first pregnancy, antepartum; and Rubella non-immune status, antepartum on their problem list.  Patient reports no complaints.  Contractions: Not present. Vag. Bleeding: None.  Movement: Present. Denies leaking of fluid.   The following portions of the patient's history were reviewed and updated as appropriate: allergies, current medications, past family history, past medical history, past social history, past surgical history and problem list.   Objective:   Vitals:   10/14/23 1348  BP: 113/72  Pulse: 87  Weight: 133 lb 12.8 oz (60.7 kg)    Fetal Status: Fetal Heart Rate (bpm): 145   Movement: Present     General:  Alert, oriented and cooperative. Patient is in no acute distress.  Skin: Skin is warm and dry. No rash noted.   Cardiovascular: Normal heart rate noted  Respiratory: Normal respiratory effort, no problems with respiration noted  Abdomen: Soft, gravid, appropriate for gestational age.  Pain/Pressure: Absent     Pelvic: Cervical exam deferred        Extremities: Normal range of motion.  Edema: None  Mental Status: Normal mood and affect. Normal behavior. Normal judgment and thought content.   Assessment and Plan:  Pregnancy: G1P0000 at [redacted]w[redacted]d 1. [redacted] weeks gestation of pregnancy 2. Supervision of normal first pregnancy, antepartum LR NIPS. Anatomy scan already scheduled.  Counseled about AFP, she wants this today, will follow up results and manage accordingly. - AFP, Serum, Open Spina Bifida No other complaints or concerns.  Routine obstetric precautions reviewed.  Please refer to After  Visit Summary for other counseling recommendations.   Return in about 4 weeks (around 11/11/2023) for OFFICE OB VISIT (MD only).  Future Appointments  Date Time Provider Department Center  11/04/2023  2:15 PM [facility] NURSE [facility]  11/04/2023  2:30 PM [facility] US2 [facility]  11/12/2023  2:30 PM Guliana Weyandt, Jethro Bastos, MD [facility]    Jaynie Collins, MD

## 2023-10-16 LAB — AFP, SERUM, OPEN SPINA BIFIDA
AFP MoM: 1.37
AFP Value: 49.1 ng/mL
Gest. Age on Collection Date: 16 wk
Maternal Age At EDD: 27.7 a
OSBR Risk 1 IN: 3920
Test Results:: NEGATIVE
Weight: 133 [lb_av]

## 2023-10-23 ENCOUNTER — Telehealth: Payer: Self-pay

## 2023-10-23 MED ORDER — CYCLOBENZAPRINE HCL 10 MG PO TABS: 10.0000 mg | ORAL_TABLET | Freq: Three times a day (TID) | ORAL | 1 refills | Status: DC | PRN

## 2023-10-23 NOTE — Telephone Encounter (Signed)
Pt calling stating that she has tried everything on the safe medication list and has not had any relief, Pt stating that she has not cut caffeine, BP has been normal, Pt is advised per protocol that we can try flexeril and increase water intake. Pt verbalized understanding.

## 2023-11-04 ENCOUNTER — Encounter: Payer: Self-pay | Admitting: *Deleted

## 2023-11-04 ENCOUNTER — Ambulatory Visit: Payer: Managed Care, Other (non HMO)

## 2023-11-04 ENCOUNTER — Ambulatory Visit: Payer: Managed Care, Other (non HMO) | Admitting: *Deleted

## 2023-11-04 ENCOUNTER — Ambulatory Visit: Payer: Managed Care, Other (non HMO) | Attending: Obstetrics & Gynecology

## 2023-11-04 VITALS — BP 123/64 | HR 70

## 2023-11-04 DIAGNOSIS — Z3400 Encounter for supervision of normal first pregnancy, unspecified trimester: Secondary | ICD-10-CM | POA: Diagnosis present

## 2023-11-04 DIAGNOSIS — O321XX0 Maternal care for breech presentation, not applicable or unspecified: Secondary | ICD-10-CM | POA: Diagnosis not present

## 2023-11-04 DIAGNOSIS — Z3A12 12 weeks gestation of pregnancy: Secondary | ICD-10-CM | POA: Diagnosis present

## 2023-11-04 DIAGNOSIS — Z3A19 19 weeks gestation of pregnancy: Secondary | ICD-10-CM | POA: Insufficient documentation

## 2023-11-04 DIAGNOSIS — O09892 Supervision of other high risk pregnancies, second trimester: Secondary | ICD-10-CM | POA: Insufficient documentation

## 2023-11-04 DIAGNOSIS — Z363 Encounter for antenatal screening for malformations: Secondary | ICD-10-CM | POA: Insufficient documentation

## 2023-11-04 DIAGNOSIS — Z2839 Other underimmunization status: Secondary | ICD-10-CM | POA: Insufficient documentation

## 2023-11-06 NOTE — L&D Delivery Note (Signed)
 OB/GYN Faculty Practice Delivery Note  Jolan Sundt is a 28 y.o. G1P0000 s/p VAVD at [redacted]w[redacted]d. She was admitted for IOL for IUGR.   ROM: 31h 5m with clear fluid GBS Status:  Positive/-- (04/30 0805) Maximum Maternal Temperature: 98.5 F    Labor Progress: Patient arrived at 3 cm dilation and was induced with misoprostol, AROM, and pitocin and over the course of several days reached full dilation. She pushed for approximately 1.5 hours at which point deep recurrent variables were noted and patient was recommended for vacuum assisted delivery.   Delivery Date/Time: 03/18/2024 at 1700  Operative Delivery Note Infant was delivered via Vacuum Assisted Vaginal Delivery due to non-reassuring fetal status.  The patient was examined and found to be Presentation: vertex; Position: Left,, Occiput,, Anterior; Station: +2.  Verbal consent: obtained from patient.  Risks and benefits discussed in detail.  Risks include, but are not limited to the risks of anesthesia, bleeding, infection, damage to maternal tissues, fetal cephalhematoma.  There is also the risk of inability to effect vaginal delivery of the head, or shoulder dystocia that cannot be resolved by established maneuvers, leading to the need for emergency cesarean section.  The Kiwi was positioned over the sagittal suture 3 cm anterior to posterior fontanelle.  Pressure was then increased to 500 mmHg, and the patient was instructed to push.  Pulling was administered along the pelvic curve.  3 pulls were administered during 3 contractions, with release of pressure between contractions.  No popoffs.  The infant was then delivered atraumatically.  Head delivered in ROP position. Nuchal x1, very short cord. Shoulder and body delivered in usual fashion. Infant with spontaneous cry, placed on mother's abdomen, dried and stimulated. Cord clamped x 2 after 1-minute delay, and cut by FOB. Cord blood drawn. Placenta delivered spontaneously with gentle cord  traction. Fundus firm with massage and Pitocin. Labia, perineum, vagina, and cervix inspected with only minor abrasions, no repair indicated.   Sponge, instrument and needle counts were correct x2.  Placenta: 3v intact, to L&D Complications: none Lacerations: abrasions, none repaired EBL: 50 cc Analgesia: epidural   Infant: Baby boy "Cillian  APGAR (1 MIN): 7  APGAR (5 MINS): 9   Weight: ***  Teena Feast, MD/MPH Attending Family Medicine Physician, [facility] for [facility], [facility]

## 2023-11-12 ENCOUNTER — Encounter: Payer: Managed Care, Other (non HMO) | Admitting: Obstetrics & Gynecology

## 2023-11-26 ENCOUNTER — Ambulatory Visit: Payer: Self-pay | Admitting: Obstetrics & Gynecology

## 2023-11-26 VITALS — BP 90/55 | HR 73 | Wt 134.4 lb

## 2023-11-26 DIAGNOSIS — Z3400 Encounter for supervision of normal first pregnancy, unspecified trimester: Secondary | ICD-10-CM

## 2023-11-26 DIAGNOSIS — Z3482 Encounter for supervision of other normal pregnancy, second trimester: Secondary | ICD-10-CM

## 2023-11-26 DIAGNOSIS — Z3A22 22 weeks gestation of pregnancy: Secondary | ICD-10-CM

## 2023-11-26 NOTE — Patient Instructions (Signed)
Oral Glucose Tolerance Test During Pregnancy Why am I having this test? The oral glucose tolerance test (GTT) is done to check how your body processes blood sugar (glucose). This is one of several tests used to diagnose diabetes that develops during pregnancy (gestational diabetes mellitus). Gestational diabetes is a short-term form of diabetes that some women develop while they are pregnant. It usually occurs during the second or third trimester of pregnancy and goes away after delivery. Testing, or screening, for gestational diabetes usually occurs around 28 of pregnancy. This test may also be needed earlier if: You have a history of gestational diabetes. There is a history of giving birth to very large babies or of losing pregnancies (having stillbirths). You have signs and symptoms of diabetes, such as: Changes in your eyesight. Tingling or numbness in your hands or feet. Changes in hunger, thirst, and urination, and these are not explained by your pregnancy. What is being tested? This test measures the amount of glucose in your blood at different times during a period of 2 hours. This shows how well your body can process glucose.  You will have three separate blood draws. What kind of sample is taken?  Blood samples are required for this test. They are usually collected by inserting a needle into a blood vessel. How do I prepare for this test? For 3 days before your test, eat normally. Have plenty of carbohydrate-rich foods. You will be asked not to eat or drink anything other than water (to fast) starting 8-10 hours before the test. Tell a health care provider about: All medicines you are taking, including vitamins, herbs, eye drops, creams, and over-the-counter medicines. Any blood disorders you have. Any surgeries you have had. Any medical conditions you have. What happens during the test? First, your blood glucose will be measured. This is referred to as your fasting blood glucose  because you fasted before the test. Then, you will drink a glucose solution that contains a certain amount of glucose. Your blood glucose will be measured again 1 and 2 hours after you drink the solution. This test takes about 2 hours to complete. You will need to stay at the testing location during this time. During the testing period: Do not eat or drink anything other than the glucose solution. Do not exercise. Do not use any products that contain nicotine or tobacco, such as cigarettes, e-cigarettes, and chewing tobacco. These can affect your test results. If you need help quitting, ask your health care provider. The testing procedure may vary among health care providers and hospitals. How are the results reported? Your results will be reported as milligrams of glucose per deciliter of blood (mg/dL) or millimoles per liter (mmol/L). There is more than one source for screening and diagnosis reference values used to diagnose gestational diabetes. Your health care provider will compare your results to normal values that were established after testing a large group of people (reference values). Reference values may vary among labs and hospitals. For this test, reference values are: Fasting: 92 mg/dL 1 hour: 180 mg/dL  2 hour: 153 mg/dL   What do the results mean? Results below the reference values are considered normal. If one or more of your blood glucose levels are at or above the reference values, you will be diagnosed with gestational diabetes.  Talk with your health care provider about what your results mean. Questions to ask your health care provider Ask your health care provider, or the department that is doing the test: When   will my results be ready? How will I get my results? What are my treatment options? What other tests do I need? What are my next steps? Summary The oral glucose tolerance test (GTT) is one of several tests used to diagnose diabetes that develops during pregnancy  (gestational diabetes mellitus). Gestational diabetes is a short-term form of diabetes that some women develop while they are pregnant. You may also have this test if you have any symptoms or risk factors for this type of diabetes. Talk with your health care provider about what your results mean. This information is not intended to replace advice given to you by your health care provider. Make sure you discuss any questions you have with your health care provider.  TDaP Vaccine Pregnancy Get the Whooping Cough Vaccine While You Are Pregnant (CDC)  It is important for women to get the whooping cough vaccine in the third trimester of each pregnancy. Vaccines are the best way to prevent this disease. There are 2 different whooping cough vaccines. Both vaccines combine protection against whooping cough, tetanus and diphtheria, but they are for different age groups: Tdap: for everyone 11 years or older, including pregnant women  DTaP: for children 2 months through 6 years of age  You need the whooping cough vaccine during each of your pregnancies The recommended time to get the shot is during your 27th through 36th week of pregnancy, preferably during the earlier part of this time period. The Centers for Disease Control and Prevention (CDC) recommends that pregnant women receive the whooping cough vaccine for adolescents and adults (called Tdap vaccine) during the third trimester of each pregnancy. The recommended time to get the shot is during your 27th through 36th week of pregnancy, preferably during the earlier part of this time period. This replaces the original recommendation that pregnant women get the vaccine only if they had not previously received it. The American College of Obstetricians and Gynecologists and the American College of Nurse-Midwives support this recommendation.  You should get the whooping cough vaccine while pregnant to pass protection to your baby frame support disabled  and/or not supported in this browser  Learn why Laura decided to get the whooping cough vaccine in her 3rd trimester of pregnancy and how her baby girl was born with some protection against the disease. Also available on YouTube. After receiving the whooping cough vaccine, your body will create protective antibodies (proteins produced by the body to fight off diseases) and pass some of them to your baby before birth. These antibodies provide your baby some short-term protection against whooping cough in early life. These antibodies can also protect your baby from some of the more serious complications that come along with whooping cough. Your protective antibodies are at their highest about 2 weeks after getting the vaccine, but it takes time to pass them to your baby. So the preferred time to get the whooping cough vaccine is early in your third trimester. The amount of whooping cough antibodies in your body decreases over time. That is why CDC recommends you get a whooping cough vaccine during each pregnancy. Doing so allows each of your babies to get the greatest number of protective antibodies from you. This means each of your babies will get the best protection possible against this disease.  Getting the whooping cough vaccine while pregnant is better than getting the vaccine after you give birth Whooping cough vaccination during pregnancy is ideal so your baby will have short-term protection as soon as he   is born. This early protection is important because your baby will not start getting his whooping cough vaccines until he is 2 months old. These first few months of life are when your baby is at greatest risk for catching whooping cough. This is also when he's at greatest risk for having severe, potentially life-threating complications from the infection. To avoid that gap in protection, it is best to get a whooping cough vaccine during pregnancy. You will then pass protection to your baby before he  is born. To continue protecting your baby, he should get whooping cough vaccines starting at 2 months old. You may never have gotten the Tdap vaccine before and did not get it during this pregnancy. If so, you should make sure to get the vaccine immediately after you give birth, before leaving the hospital or birthing center. It will take about 2 weeks before your body develops protection (antibodies) in response to the vaccine. Once you have protection from the vaccine, you are less likely to give whooping cough to your newborn while caring for him. But remember, your baby will still be at risk for catching whooping cough from others. A recent study looked to see how effective Tdap was at preventing whooping cough in babies whose mothers got the vaccine while pregnant or in the hospital after giving birth. The study found that getting Tdap between 27 through 36 weeks of pregnancy is 85% more effective at preventing whooping cough in babies younger than 2 months old. Blood tests cannot tell if you need a whooping cough vaccine There are no blood tests that can tell you if you have enough antibodies in your body to protect yourself or your baby against whooping cough. Even if you have been sick with whooping cough in the past or previously received the vaccine, you still should get the vaccine during each pregnancy. Breastfeeding may pass some protective antibodies onto your baby By breastfeeding, you may pass some antibodies you have made in response to the vaccine to your baby. When you get a whooping cough vaccine during your pregnancy, you will have antibodies in your breast milk that you can share with your baby as soon as your milk comes in. However, your baby will not get protective antibodies immediately if you wait to get the whooping cough vaccine until after delivering your baby. This is because it takes about 2 weeks for your body to create antibodies. Learn more about the health benefits of  breastfeeding.   RSV Vaccination for Pregnant People  CDC recommends two ways to protect babies from getting very sick with Respiratory Syncytial Virus (RSV):  An RSV vaccination given during pregnancy  Pf[employer]'s vaccine Verdis Frederickson) is recommended for use during pregnancy. It is given during RSV season to people who are 32 through [redacted] weeks pregnant.  Or, An RSV immunization given directly to infants and some older babies  Babies born to mothers who get RSV vaccine at least 2 weeks before delivery will have protection and, in most cases, should not need an RSV immunization later.    When is RSV season?  In most regions of the [place] RSV season starts in the fall and peaks in the winter, but the timing and severity of RSV season can vary from place to place and year to year.   The goal of maternal RSV vaccination is to protect babies from getting very sick with RSV during their first RSV season.  In most of the continental [place], this means maternal  RSV vaccine will be given in September through January.  Who should get the maternal RSV vaccine?  People who are 30 through [redacted] weeks pregnant during September through January should get one dose of maternal RSV vaccine to protect their babies. RSV season can vary around the country.   How is the maternal RSV vaccine administered?  Maternal RSV vaccine is given as a shot into the mother's upper arm. Only a single dose (one shot) of maternal RSV vaccine is recommended.   It is not yet known whether another dose might be needed in later pregnancies.  How well does the maternal RSV vaccine work?  When someone gets RSV vaccine, their body responds by making a protein that protects against the virus that causes RSV. The process takes about 2 weeks. When a pregnant person gets RSV vaccine, their protective proteins (called antibodies) also pass to their baby. So, babies who are born at least 2 weeks after their mother gets RSV  vaccine are protected at birth, when infants are at the highest risk of severe RSV disease.   The vaccine can reduce a baby's risk of being hospitalized from RSV by 57% in the first six months after birth.  What are the possible side effects of the maternal RSV vaccine?  In the clinical trials, the side effects most often reported by pregnant people who received the maternal RSV vaccine were pain at the injection site, headache, muscle pain, and nausea.  Although not common, a dangerous high blood pressure condition called pre-eclampsia occurred in 1.8% of pregnant people who received the maternal RSV vaccine compared to 1.4% of pregnant people who received a placebo.  The clinical trials identified a small increase in the number of preterm births in vaccinated pregnant people. It is not clear if this is a true safety problem related to RSV vaccine or if this occurred for reasons unrelated to vaccination.  To reduce the potential risk of preterm birth and complications from RSV disease, FDA approved the maternal RSV vaccine for use during weeks 32 through 84 of pregnancy while additional studies are conducted.  FDA is requiring the manufacturer to do additional studies that will look more closely at the potential risk of preterm births and pregnancy-related high blood pressure issues in mothers, including pre-eclampsia.  Severe allergic reactions to vaccines are rare but can happen after any vaccine and can be life-threatening. If you see signs of a severe allergic reaction after vaccination (hives, swelling of the face and throat, difficulty breathing, a fast heartbeat, dizziness, or weakness), seek immediate medical care by calling 911.  As with any medicine or vaccine there is a very remote chance of the vaccine causing other serious injury or death after vaccination.  Adverse events following vaccination should be reported to the Vaccine Adverse Event Reporting System (VAERS), even if it's not  clear that the vaccine caused the adverse event. You or your doctor can report an adverse event to [facility] and FDA through VAERS. If you need further assistance reporting to VAERS, please email [email or web address] or call [phone].  If you have any questions about side effects from the maternal RSV vaccine, talk with your healthcare provider.  Do I need a prescription for a maternal RSV vaccine?  Until the vaccine available in the office, you will need a prescription to take to a local pharmacy that is providing the vaccine.   How do I pay for the maternal RSV vaccine?  Most private health insurance plans cover the maternal RSV  vaccine, but there may be a cost to you depending on your plan.  Contact your insurer to find out.  Medicaid Beginning August 05, 2022, most people with coverage from [facility] and [employer] [facility]) will be guaranteed coverage of all vaccines recommended by the Advisory Committee on Immunization Practice at no cost to them.   Source: [facility] for Immunization and Respiratory Diseases

## 2023-11-26 NOTE — Progress Notes (Signed)
PRENATAL VISIT NOTE  Subjective:  Connie Fernandez is a 28 y.o. G1P0000 at [redacted]w[redacted]d being seen today for ongoing prenatal care.  She is currently monitored for the following issues for this low-risk pregnancy and has Anxiety; Major depressive disorder, recurrent episode (HCC); Bipolar 2 disorder (HCC); Opioid use disorder, mild, abuse (HCC); Supervision of normal first pregnancy, antepartum; and Rubella non-immune status, antepartum on their problem list.  Patient reports no complaints.  Contractions: Not present. Vag. Bleeding: None.  Movement: Present. Denies leaking of fluid.   The following portions of the patient's history were reviewed and updated as appropriate: allergies, current medications, past family history, past medical history, past social history, past surgical history and problem list.   Objective:   Vitals:   11/26/23 1344  BP: (!) 90/55  Pulse: 73  Weight: 134 lb 6.4 oz (61 kg)    Fetal Status: Fetal Heart Rate (bpm): 132   Movement: Present     General:  Alert, oriented and cooperative. Patient is in no acute distress.  Skin: Skin is warm and dry. No rash noted.   Cardiovascular: Normal heart rate noted  Respiratory: Normal respiratory effort, no problems with respiration noted  Abdomen: Soft, gravid, appropriate for gestational age.  Pain/Pressure: Absent     Pelvic: Cervical exam deferred        Extremities: Normal range of motion.  Edema: None  Mental Status: Normal mood and affect. Normal behavior. Normal judgment and thought content.   [place] MFM OB DETAIL +14 WK Result Date: 11/04/2023 ----------------------------------------------------------------------  OBSTETRICS REPORT                       (Signed Final 11/04/2023 03:27 pm) ---------------------------------------------------------------------- Patient Info  ID #:       [id number]                          D.O.B.:  Apr 27, 1996 (27 yrs)(F)  Name:       Connie Fernandez              Visit Date: 11/04/2023 03:03  pm ---------------------------------------------------------------------- Performed By  Attending:        Braxton Feathers DO       Ref. Address:     [address]                                                             [place], [place]                                                             [address]  Performed By:     Octaviano Batty BS       [facility]  Referred By:      Tereso Newcomer MD ---------------------------------------------------------------------- Orders  #  Description                           Code        Ordered By  1  [place] MFM OB DETAIL +14 WK               16109.60    Braxton Feathers ----------------------------------------------------------------------  #  Order #                     Accession #                Episode #  1  [id number]                   [id number]                 [id number] ---------------------------------------------------------------------- Indications  Encounter for antenatal screening for          Z36.3  malformations  Echogenic intracardiac focus of the heart      O35.8XX0  (EIF)  [redacted] weeks gestation of pregnancy                Z3A.19  Low risk NIPS ---------------------------------------------------------------------- Fetal Evaluation  Num Of Fetuses:         1  Fetal Heart Rate(bpm):  133  Cardiac Activity:       Observed  Presentation:           Breech  Placenta:               Posterior  P. Cord Insertion:      Visualized, central  Amniotic Fluid  AFI FV:      Within normal limits                              Largest Pocket(cm)                              7.06 ---------------------------------------------------------------------- Biometry  BPD:      39.3  mm     G. Age:  18w 0d         11  %    CI:        69.72   %    70 -  86  FL/HC:      18.6   %    16.1 - 18.3  HC:      150.2  mm     G. Age:  18w 1d          8  %    HC/AC:      1.11        1.09 - 1.39  AC:      135.9  mm     G. Age:  19w 0d         47  %    FL/BPD:     71.0   %  FL:       27.9  mm     G. Age:  18w 4d         27  %    FL/AC:      20.5   %    20 - 24  CER:      18.4  mm     G. Age:  18w 1d          8  %  NFT:       2.9  mm  LV:        4.7  mm  CM:        5.1  mm  Est. FW:     253  gm      0 lb 9 oz     29  % ---------------------------------------------------------------------- Gestational Age  LMP:           19w 6d        Date:  06/18/23                  EDD:   03/24/24  U/S Today:     18w 3d                                        EDD:   04/03/24  Best:          19w 0d     Det. ByMarcella Dubs         EDD:   03/30/24                                      (08/12/23) ---------------------------------------------------------------------- Targeted Anatomy  Central Nervous System  Calvarium/Cranial V.:  Appears normal         Cereb./Vermis:          Appears normal  Cavum:                 Appears normal         Cisterna Magna:         Appears normal  Lateral Ventricles:    Appears normal         Midline Falx:           Appears normal  Choroid Plexus:        Appears normal  Spine  Cervical:              Appears normal         Sacral:                 Appears normal  Thoracic:              Appears  normal         Shape/Curvature:        Appears normal  Lumbar:                Appears normal  Head/Neck  Lips:                  Appears normal         Profile:                Appears normal  Neck:                  Appears normal         Orbits/Eyes:            Appears normal  Nuchal Fold:           Appears normal         Mandible:               Appears normal  Nasal Bone:            Present                Maxilla:                Appears normal  Thorax  4 Chamber View:        Appears normal; EIF    Interventr. Septum:      Appears normal  Cardiac Rhythm:        Normal                 Cardiac Axis:           Normal  Cardiac Situs:         Appears normal         Diaphragm:              Appears normal  Rt Outflow Tract:      Appears normal         3 Vessel View:          Appears normal  Lt Outflow Tract:      Appears normal         3 V Trachea View:       Appears normal  Aortic Arch:           Appears normal         IVC:                    Appears normal  Ductal Arch:           Appears normal         Crossing:               Appears normal  SVC:                   Appears normal  Abdomen  Ventral Wall:          Appears normal         Lt Kidney:              Appears normal  Cord Insertion:        Appears normal         Rt Kidney:              Appears normal  Situs:                 Appears normal  Bladder:                Appears normal  Stomach:               Appears normal  Extremities  Lt Humerus:            Appears normal         Lt Femur:               Appears normal  Rt Humerus:            Appears normal         Rt Femur:               Appears normal  Lt Forearm:            Appears normal         Lt Lower Leg:           Appears normal  Rt Forearm:            Appears normal         Rt Lower Leg:           Appears normal  Lt Hand:               Open hand nml          Lt Foot:                Nml heel/foot  Rt Hand:               Open hand nml          Rt Foot:                Nml heel/foot  Other  Umbilical Cord:        Normal 3-vessel        Genitalia:              Female-nml ---------------------------------------------------------------------- Cervix Uterus Adnexa  Cervix  Length:            3.1  cm.  Normal appearance by transabdominal scan  Uterus  No abnormality visualized.  Right Ovary  Not visualized.  Left Ovary  Not visualized.  Adnexa  No abnormality visualized ---------------------------------------------------------------------- Comments  The patient is here for an anatomy ultrasound. Her pregnancy  is uncomplicated.  Aneuplolidy screening: low risk.  Sonographic findings  Single intrauterine pregnancy at 19w 0d.  Fetal cardiac activity:  Observed and appears normal.  Presentation: Breech.  The anatomic structures that were well seen appear normal.  There is an EIF which is a normal variant in this setting. The  anatomic survey is complete.  Fetal biometry shows the estimated fetal weight at the 29  percentile.  Amniotic fluid: Within normal limits.  MVP: 7.06 cm.  Placenta: Posterior.  Adnexa: No abnormality visualized.  Cervical length: 3.1 cm.  Recommendations  -EDD should be 03/30/2024 based on  Early Ultrasound  (08/12/23).  -No further ultrasounds are recommended at this time based  on the current indications. If future indications arise (e.g.  size/date discrepancy on fundal height, gestational diabetes  or hypertension) and an ultrasound is to be desired at our  MFM office, please send a referral. ----------------------------------------------------------------------                   Braxton Feathers, DO Electronically Signed Final Report   11/04/2023 03:27 pm ----------------------------------------------------------------------    Assessment and Plan:  Pregnancy: G1P0000 at [redacted]w[redacted]d 1. [redacted] weeks gestation  of pregnancy 2. Supervision of normal first pregnancy, antepartum (Primary) Normal anatomy scan. No other concerns. Will do GTT and other labs next visit. Preterm labor symptoms and general obstetric precautions including but not limited to vaginal bleeding, contractions, leaking of fluid and fetal movement were reviewed in detail with the patient. Please refer to After Visit Summary for other counseling recommendations.   Return in about 4 weeks (around 12/24/2023) for 2 hr GTT, 3rd trimester labs, OFFICE OB VISIT (MD only).  No future appointments.  Jaynie Collins, MD

## 2023-11-29 ENCOUNTER — Encounter: Payer: Self-pay | Admitting: Obstetrics & Gynecology

## 2023-11-29 ENCOUNTER — Encounter (HOSPITAL_COMMUNITY): Payer: Self-pay | Admitting: Obstetrics & Gynecology

## 2023-11-29 ENCOUNTER — Inpatient Hospital Stay (HOSPITAL_COMMUNITY)
Admission: AD | Admit: 2023-11-29 | Discharge: 2023-11-29 | Disposition: A | Discharge status: Home / Self Care | Payer: Self-pay | Attending: Obstetrics & Gynecology | Admitting: Obstetrics & Gynecology

## 2023-11-29 ENCOUNTER — Other Ambulatory Visit: Payer: Self-pay

## 2023-11-29 DIAGNOSIS — F1110 Opioid abuse, uncomplicated: Secondary | ICD-10-CM | POA: Insufficient documentation

## 2023-11-29 DIAGNOSIS — J101 Influenza due to other identified influenza virus with other respiratory manifestations: Secondary | ICD-10-CM | POA: Insufficient documentation

## 2023-11-29 DIAGNOSIS — O99512 Diseases of the respiratory system complicating pregnancy, second trimester: Secondary | ICD-10-CM | POA: Insufficient documentation

## 2023-11-29 DIAGNOSIS — O99332 Smoking (tobacco) complicating pregnancy, second trimester: Secondary | ICD-10-CM | POA: Insufficient documentation

## 2023-11-29 DIAGNOSIS — F419 Anxiety disorder, unspecified: Secondary | ICD-10-CM | POA: Insufficient documentation

## 2023-11-29 DIAGNOSIS — F17290 Nicotine dependence, other tobacco product, uncomplicated: Secondary | ICD-10-CM | POA: Insufficient documentation

## 2023-11-29 DIAGNOSIS — O99342 Other mental disorders complicating pregnancy, second trimester: Secondary | ICD-10-CM | POA: Insufficient documentation

## 2023-11-29 DIAGNOSIS — Z3A22 22 weeks gestation of pregnancy: Secondary | ICD-10-CM | POA: Insufficient documentation

## 2023-11-29 DIAGNOSIS — B349 Viral infection, unspecified: Secondary | ICD-10-CM

## 2023-11-29 DIAGNOSIS — O99322 Drug use complicating pregnancy, second trimester: Secondary | ICD-10-CM | POA: Insufficient documentation

## 2023-11-29 LAB — URINALYSIS, ROUTINE W REFLEX MICROSCOPIC
Bilirubin Urine: NEGATIVE
Glucose, UA: 150 mg/dL — AB
Hgb urine dipstick: NEGATIVE
Ketones, ur: NEGATIVE mg/dL
Leukocytes,Ua: NEGATIVE
Nitrite: NEGATIVE
Protein, ur: NEGATIVE mg/dL
Specific Gravity, Urine: 1.012 (ref 1.005–1.030)
pH: 6 (ref 5.0–8.0)

## 2023-11-29 LAB — RESP PANEL BY RT-PCR (RSV, FLU A&B, COVID)  RVPGX2
Influenza A by PCR: POSITIVE — AB
Influenza B by PCR: NEGATIVE
Resp Syncytial Virus by PCR: NEGATIVE
SARS Coronavirus 2 by RT PCR: NEGATIVE

## 2023-11-29 MED ORDER — ACETAMINOPHEN 500 MG PO TABS
1000.0000 mg | ORAL_TABLET | Freq: Once | ORAL | Status: AC
  Administered 2023-11-29: 1000 mg via ORAL
  Filled 2023-11-29: qty 2

## 2023-11-29 MED ORDER — LACTATED RINGERS IV BOLUS
1000.0000 mL | Freq: Once | INTRAVENOUS | Status: AC
  Administered 2023-11-29: 1000 mL via INTRAVENOUS

## 2023-11-29 MED ORDER — OSELTAMIVIR PHOSPHATE 75 MG PO CAPS: 75.0000 mg | ORAL_CAPSULE | Freq: Two times a day (BID) | ORAL | 0 refills | Status: DC

## 2023-11-29 NOTE — Discharge Instructions (Signed)

## 2023-11-29 NOTE — MAU Provider Note (Addendum)
MAU Provider Note  Chief Complaint: Fever  SUBJECTIVE HPI: [name] is a 28 y.o. G1P0000 at [redacted]w[redacted]d by early ultrasound who presents to maternity admissions reporting fever, body aches. Pregnancy c/b anxiety, hx of OUD, RNI. Receives [facility] with [facility].  Patient presents with flu-like symptoms for past day. Fever, chills, cough, congestion. Full body aches. Has been going around her family. Keeping down fluids. Not very hungry. Denies SOB, CP, extremity swelling, urinary symptoms, LOF, VB, cramping. Feeling some fetal movement. No history of respiratory issues.  HPI  Past Medical History:  Diagnosis Date   Acne 02/19/2017   Acute cystitis with hematuria 07/25/2017   Anemia    Anxiety    Chronic pelvic pain in female    Had negative laparoscopy for endometriosis in 2017   Depression    Dysmenorrhea 02/19/2017   Gastritis 08/01/2021   Headache    Migraines   Heart palpitations    History of drug use 07/25/2017   History of drug use 07/25/2017   Opioid use disorder, mild, abuse (HCC) 01/19/2020   Pelvic floor dysfunction 10/05/2016   Primary dysmenorrhea 10/05/2016   Negative laparoscopy for endometriosis     Tobacco use disorder 10/28/2015   Past Surgical History:  Procedure Laterality Date   LAPAROSCOPY N/A 06/12/2016   Procedure: LAPAROSCOPY DIAGNOSTIC;  Surgeon: Allie Bossier, MD;  Location: [facility];  Service: Gynecology;  Laterality: N/A;   Social History   Socioeconomic History   Marital status: Significant Other    Spouse name: Not on file   Number of children: Not on file   Years of education: Not on file   Highest education level: Not on file  Occupational History   Not on file  Tobacco Use   Smoking status: Every Day    Current packs/day: 0.00    Types: E-cigarettes, Cigarettes   Smokeless tobacco: Never  Vaping Use   Vaping status: Every Day   Substances: Nicotine, Flavoring  Substance and Sexual Activity   Alcohol use: Not Currently    Comment: few  drinks per month.    Drug use: Not Currently    Comment: Just stopped Kratum last week   Sexual activity: Yes    Partners: Male    Birth control/protection: None  Other Topics Concern   Not on file  Social History Narrative   Lives with mother, father, and 2 sisters    Working at [employer] Below 3:30- 9:30pm   1 dog    Right handed    Enjoys working, Chief of Staff out with friends         Social Drivers of Corporate investment banker Strain: Not on file  Food Insecurity: Not on file  Transportation Needs: Not on file  Physical Activity: Not on file  Stress: Not on file  Social Connections: Not on file  Intimate Partner Violence: Not on file   No current facility-administered medications on file prior to encounter.   Current Outpatient Medications on File Prior to Encounter  Medication Sig Dispense Refill   aspirin EC 81 MG tablet Take 1 tablet (81 mg total) by mouth at bedtime. Start taking when you are [redacted] weeks pregnant for rest of pregnancy for prevention of preeclampsia 300 tablet 2   Prenatal MV & Min w/FA-DHA (PRENATAL GUMMIES PO) Take by mouth.     Allergies  Allergen Reactions   Latex Itching   Penicillins Hives    Has patient had a PCN reaction causing immediate rash, facial/tongue/throat swelling, SOB or lightheadedness  with hypotension: Dizziness and shaky. Has patient had a PCN reaction causing severe rash involving mucus membranes or skin necrosis: NO Has patient had a PCN reaction that required hospitalization: NO Has patient had a PCN reaction occurring within the last 10 years: NO If all of the above answers are "NO", then may proceed with Cephalosporin use.     ROS:  Pertinent positives/negatives listed above.  I have reviewed patient's Past Medical Hx, Surgical Hx, Family Hx, Social Hx, medications and allergies.   Physical Exam  Patient Vitals for the past 24 hrs:  BP Temp Temp src Pulse Resp SpO2 Height Weight  11/29/23 2147 (!) 97/56 98.4 F (36.9 C)  Oral 86 18 -- -- --  11/29/23 2017 127/69 (!) 101.1 F (38.4 C) Oral (!) 103 18 -- -- --  11/29/23 2015 -- -- -- -- -- 100 % 5\' 2"  (1.575 m) 61.1 kg   Constitutional: Well-developed, well-nourished female. Appears mildly ill Cardiovascular: normal rate Respiratory: normal effort GI: Abd soft, non-tender MS: Extremities nontender, no edema, normal ROM Neurologic: Alert and oriented x 4  GU: Neg CVAT  Dopplers: 160  LAB RESULTS Results for orders placed or performed during the hospital encounter of 11/29/23 (from the past 24 hours)  Resp panel by RT-PCR (RSV, Flu A&B, Covid) Anterior Nasal Swab     Status: Abnormal   Collection Time: 11/29/23  8:25 PM   Specimen: Anterior Nasal Swab  Result Value Ref Range   SARS Coronavirus 2 by RT PCR NEGATIVE NEGATIVE   Influenza A by PCR POSITIVE (A) NEGATIVE   Influenza B by PCR NEGATIVE NEGATIVE   Resp Syncytial Virus by PCR NEGATIVE NEGATIVE  Urinalysis, Routine w reflex microscopic -Urine, Clean Catch     Status: Abnormal   Collection Time: 11/29/23  8:26 PM  Result Value Ref Range   Color, Urine YELLOW YELLOW   APPearance HAZY (A) CLEAR   Specific Gravity, Urine 1.012 1.005 - 1.030   pH 6.0 5.0 - 8.0   Glucose, UA 150 (A) NEGATIVE mg/dL   Hgb urine dipstick NEGATIVE NEGATIVE   Bilirubin Urine NEGATIVE NEGATIVE   Ketones, ur NEGATIVE NEGATIVE mg/dL   Protein, ur NEGATIVE NEGATIVE mg/dL   Nitrite NEGATIVE NEGATIVE   Leukocytes,Ua NEGATIVE NEGATIVE    B/Positive/-- (11/11 1543)  IMAGING [place] MFM OB DETAIL +14 WK Result Date: 11/04/2023 ----------------------------------------------------------------------  OBSTETRICS REPORT                       (Signed Final 11/04/2023 03:27 pm) ---------------------------------------------------------------------- Patient Info  ID #:       [id number]                          D.O.B.:  08-02-1996 (27 yrs)(F)  Name:       Connie Fernandez              Visit Date: 11/04/2023 03:03 pm  ---------------------------------------------------------------------- Performed By  Attending:        Braxton Feathers DO       Ref. Address:     [address]                                                             [place], [place]  [address]  Performed By:     Octaviano Batty BS       [facility]  Referred By:      Tereso Newcomer MD ---------------------------------------------------------------------- Orders  #  Description                           Code        Ordered By  1  [place] MFM OB DETAIL +14 WK               09811.91    Braxton Feathers ----------------------------------------------------------------------  #  Order #                     Accession #                Episode #  1  [id number]                   [id number]                 [id number] ---------------------------------------------------------------------- Indications  Encounter for antenatal screening for          Z36.3  malformations  Echogenic intracardiac focus of the heart      O35.8XX0  (EIF)  [redacted] weeks gestation of pregnancy                Z3A.19  Low risk NIPS ---------------------------------------------------------------------- Fetal Evaluation  Num Of Fetuses:         1  Fetal Heart Rate(bpm):  133  Cardiac Activity:       Observed  Presentation:           Breech  Placenta:               Posterior  P. Cord Insertion:      Visualized, central  Amniotic Fluid  AFI FV:      Within normal limits                              Largest Pocket(cm)  7.06 ---------------------------------------------------------------------- Biometry  BPD:      39.3  mm     G. Age:  18w 0d         11  %    CI:        69.72   %    70 - 86                                                           FL/HC:      18.6   %    16.1 - 18.3  HC:      150.2  mm     G. Age:  18w 1d          8  %    HC/AC:      1.11        1.09 - 1.39  AC:      135.9  mm     G. Age:  19w 0d         47  %    FL/BPD:     71.0   %  FL:       27.9  mm     G. Age:  18w 4d         27  %    FL/AC:      20.5   %    20 - 24  CER:      18.4  mm     G. Age:  18w 1d          8  %  NFT:       2.9  mm  LV:        4.7  mm  CM:        5.1  mm  Est. FW:     253  gm      0 lb 9 oz     29  % ---------------------------------------------------------------------- Gestational Age  LMP:           19w 6d        Date:  06/18/23                  EDD:   03/24/24  U/S Today:     18w 3d                                        EDD:   04/03/24  Best:          19w 0d     Det. ByMarcella Dubs         EDD:   03/30/24                                      (08/12/23) ---------------------------------------------------------------------- Targeted Anatomy  Central Nervous System  Calvarium/Cranial V.:  Appears normal         Cereb./Vermis:          Appears normal  Cavum:                 Appears normal         Cisterna Magna:  Appears normal  Lateral Ventricles:    Appears normal         Midline Falx:           Appears normal  Choroid Plexus:        Appears normal  Spine  Cervical:              Appears normal         Sacral:                 Appears normal  Thoracic:              Appears normal         Shape/Curvature:        Appears normal  Lumbar:                Appears normal  Head/Neck  Lips:                  Appears normal         Profile:                Appears normal  Neck:                  Appears normal         Orbits/Eyes:            Appears normal  Nuchal Fold:           Appears normal         Mandible:               Appears normal  Nasal Bone:            Present                Maxilla:                Appears normal  Thorax  4 Chamber View:        Appears normal; EIF    Interventr. Septum:     Appears  normal  Cardiac Rhythm:        Normal                 Cardiac Axis:           Normal  Cardiac Situs:         Appears normal         Diaphragm:              Appears normal  Rt Outflow Tract:      Appears normal         3 Vessel View:          Appears normal  Lt Outflow Tract:      Appears normal         3 V Trachea View:       Appears normal  Aortic Arch:           Appears normal         IVC:                    Appears normal  Ductal Arch:           Appears normal         Crossing:               Appears normal  SVC:                   Appears normal  Abdomen  Ventral Wall:          Appears normal         Lt Kidney:              Appears normal  Cord Insertion:        Appears normal         Rt Kidney:              Appears normal  Situs:                 Appears normal         Bladder:                Appears normal  Stomach:               Appears normal  Extremities  Lt Humerus:            Appears normal         Lt Femur:               Appears normal  Rt Humerus:            Appears normal         Rt Femur:               Appears normal  Lt Forearm:            Appears normal         Lt Lower Leg:           Appears normal  Rt Forearm:            Appears normal         Rt Lower Leg:           Appears normal  Lt Hand:               Open hand nml          Lt Foot:                Nml heel/foot  Rt Hand:               Open hand nml          Rt Foot:                Nml heel/foot  Other  Umbilical Cord:        Normal 3-vessel        Genitalia:              Female-nml ---------------------------------------------------------------------- Cervix Uterus Adnexa  Cervix  Length:            3.1  cm.  Normal appearance by transabdominal scan  Uterus  No abnormality visualized.  Right Ovary  Not visualized.  Left Ovary  Not visualized.  Adnexa  No abnormality visualized ---------------------------------------------------------------------- Comments  The patient is here for an anatomy ultrasound. Her pregnancy  is uncomplicated. Aneuplolidy  screening: low risk.  Sonographic findings  Single intrauterine pregnancy at 19w 0d.  Fetal cardiac activity:  Observed and appears normal.  Presentation: Breech.  The anatomic structures that were well seen appear normal.  There is an EIF which is a normal variant in this setting. The  anatomic survey is complete.  Fetal biometry shows the estimated fetal weight at the 29  percentile.  Amniotic fluid: Within normal limits.  MVP: 7.06 cm.  Placenta: Posterior.  Adnexa: No abnormality visualized.  Cervical length: 3.1 cm.  Recommendations  -  EDD should be 03/30/2024 based on  Early Ultrasound  (08/12/23).  -No further ultrasounds are recommended at this time based  on the current indications. If future indications arise (e.g.  size/date discrepancy on fundal height, gestational diabetes  or hypertension) and an ultrasound is to be desired at our  MFM office, please send a referral. ----------------------------------------------------------------------                   Braxton Feathers, DO Electronically Signed Final Report   11/04/2023 03:27 pm ----------------------------------------------------------------------    MAU Management/MDM: Orders Placed This Encounter  Procedures   Resp panel by RT-PCR (RSV, Flu A&B, Covid) Anterior Nasal Swab   Urinalysis, Routine w reflex microscopic -Urine, Clean Catch   Airborne and Contact precautions   Discharge patient    Meds ordered this encounter  Medications   acetaminophen (TYLENOL) tablet 1,000 mg   lactated ringers bolus 1,000 mL   oseltamivir (TAMIFLU) 75 MG capsule    Sig: Take 1 capsule (75 mg total) by mouth every 12 (twelve) hours.    Dispense:  10 capsule    Refill:  0     Available prenatal records reviewed.  Patient presents with flu-like symptoms. Suspect viral illness. She does not have any signs of complication including CP, SOB, extremity swelling. Will treat with tylenol, fluids for symptom relief as she is febrile here. RSV/flu/COVID swab  obtained. Will additionally obtain UA to rule-out urinary etiology.  Reassessed patient. Feels improved with above interventions. Amenable to receiving swab result via MyChart. Is amenable to antiviral treatment if indicated as well. Supportive measures for viral illness during pregnancy given as well.  Results positive for Influenza A. Tamiflu to be prescribed.  FWB: normal dopplers, +FM.  ASSESSMENT 1. Influenza A   2. Viral syndrome   3. [redacted] weeks gestation of pregnancy     PLAN Tamiflu prescribed for patient.  Of note, this result returned after patient left, voicemail was left for patient and MyChart message also sent.  Respiratory distress precautions reviewed. Discharge home with strict return precautions. Allergies as of 11/29/2023       Reactions   Latex Itching   Penicillins Hives   Has patient had a PCN reaction causing immediate rash, facial/tongue/throat swelling, SOB or lightheadedness with hypotension: Dizziness and shaky. Has patient had a PCN reaction causing severe rash involving mucus membranes or skin necrosis: NO Has patient had a PCN reaction that required hospitalization: NO Has patient had a PCN reaction occurring within the last 10 years: NO If all of the above answers are "NO", then may proceed with Cephalosporin use.        Medication List     STOP taking these medications    cyclobenzaprine 10 MG tablet Commonly known as: FLEXERIL   hydrOXYzine 25 MG tablet Commonly known as: ATARAX   ondansetron 4 MG disintegrating tablet Commonly known as: ZOFRAN-ODT       TAKE these medications    aspirin EC 81 MG tablet Take 1 tablet (81 mg total) by mouth at bedtime. Start taking when you are [redacted] weeks pregnant for rest of pregnancy for prevention of preeclampsia   oseltamivir 75 MG capsule Commonly known as: TAMIFLU Take 1 capsule (75 mg total) by mouth every 12 (twelve) hours.   PRENATAL GUMMIES PO Take by mouth.         Wylene Simmer,  MD OB Fellow 11/29/2023  10:45 PM     Attestation of Attending Supervision of Obstetric Fellow: Evaluation and  management procedures were performed by the Obstetric Fellow under my supervision and collaboration.  I have reviewed the Obstetric Fellow's note and chart, and I agree with the management and plan. I have also made any necessary editorial changes.   Jaynie Collins, MD, FACOG Attending Obstetrician & Gynecologist, [facility] for [employer], [facility]

## 2023-11-29 NOTE — MAU Note (Signed)
.  Connie Fernandez is a 28 y.o. at [redacted]w[redacted]d here in MAU reporting: body aches, fever, flu symptoms starting Thursday morning. Also states hasn't felt baby move since yesterday.  Pain score: 6/10 generalized aching Vitals:   11/29/23 2015 11/29/23 2017  BP:  127/69  Pulse:  (!) 103  Resp:  18  Temp:  (!) 101.1 F (38.4 C)  SpO2: 100%      FHT: 160 with +FM Lab orders placed from triage: none

## 2023-11-30 ENCOUNTER — Encounter: Payer: Self-pay | Admitting: Obstetrics and Gynecology

## 2023-12-25 ENCOUNTER — Other Ambulatory Visit: Payer: Medicaid Other

## 2023-12-25 ENCOUNTER — Ambulatory Visit: Payer: Medicaid Other | Admitting: Family Medicine

## 2023-12-25 VITALS — BP 123/78 | HR 77 | Wt 135.0 lb

## 2023-12-25 DIAGNOSIS — Z1339 Encounter for screening examination for other mental health and behavioral disorders: Secondary | ICD-10-CM

## 2023-12-25 DIAGNOSIS — Z3400 Encounter for supervision of normal first pregnancy, unspecified trimester: Secondary | ICD-10-CM

## 2023-12-25 DIAGNOSIS — Z3A26 26 weeks gestation of pregnancy: Secondary | ICD-10-CM

## 2023-12-25 DIAGNOSIS — O09899 Supervision of other high risk pregnancies, unspecified trimester: Secondary | ICD-10-CM

## 2023-12-25 DIAGNOSIS — Z2839 Other underimmunization status: Secondary | ICD-10-CM | POA: Diagnosis not present

## 2023-12-25 NOTE — Patient Instructions (Signed)
[email or web address]  [facility] Pediatric Providers  [place] ([address]) [facility] Manson Passey, MD; Deirdre Priest, MD; Lum Babe, MD; Leveda Anna, MD; McDiarmid, MD; Jerene Bears, MD [address] [place] [place], [place] [address] [phone] Mon-Fri 8:30-12:30, 1:30-5:00  Providers come to see babies during newborn hospitalization Only accepting infants of Mother's who are seen at [facility] or have siblings seen at   [facility] Medicaid - Yes; Tricare - Yes   [facility] [place], MD [address] [place], [place] [address] [phone] Mon, Tue, Thur, Fri 8:30-5:00, Wed 10:00-7:00 (closed 1-2pm daily for lunch) [facility] residents with no insurance.  [place] [employer] only with Medicaid/insurance; Tricare - no  [facility]) - [facility], MD; Manson Passey, MD; Ave Filter, MD; Luna Fuse, MD; Kennedy Bucker, MD; Florestine Avers, MD; Melchor Amour, MD; Yetta Barre,  MD; Konrad Dolores, MD; Kathlene November, MD; Jenne Campus, MD; Wynetta Emery, MD; Duffy Rhody, MD; Gerre Couch, NP [address] [place], [place], [place] [address] [phone] Mon, Tue, Thur, Fri 8:30-5:30, Wed 9:30-5:30, Sat 8:30-12:30 Only accepting infants of first-time parents or siblings of current patients Hospital discharge coordinator will make follow-up appointment Medicaid - yes; Tricare - yes  [place] [phone]) [facility] Ilean China, MD; Earlene Plater, MD; Jamesetta Orleans, MD; Alvera Novel, MD; Rana Snare, MD; [facility], MD; Shaaron Adler, MD; Hosie Poisson, MD; Mayford Knife, MD [address], [place], [place] [address] [phone] Mon-Fri 8:30-5:00, closed for lunch 12:30-1:30; Sat-Sun 10:00-1:00 Accepting Newborns with commercial insurance only, must call prior to delivery to be accepted into  practice.  Medicaid - no, Tricare - yes   [facility] [address] [place], [place] [address] [phone] or [phone] Mon to Fri 8am to  10pm, Sat 8am to 1pm (virtual only on weekends) Only accepts Medicaid Healthy Blue pts  [facility] ([facility]) - Pediatrics at Elige Radon, MD; Sabino Dick, MD; Quitman Livings, MD; Betha Loa, NP; Claretha Cooper, MD; Lelon Perla, MD [address] [place] [place], [place] [address] [phone] Mon-Fri 8:30-5:30 Medicaid - yes, Tricare - yes  [place] [phone]) [facility] Marcie Mowers, MD [address], [place], [place] [address] [phone] Iona Hansen, Wed Fri 8:30-5:00, Sat 8:30-12:00, Closed Thursdays Accepting siblings of established patients and first time mom's if you call prenatally Medicaid- yes; Tricare - yes  [facility] Lutricia Feil, [place]; Tracie Harrier, MD; Rusty Aus; Scifres, PA; Wynelle Link, MD; Azucena Cecil, MD;  [address] [place], [place] [address] [phone] Mon-Fri 8:30-5:00, closed for lunch 1-2 Only accepting newborns of established patients Medicaid- no; Tricare - yes  [facility] [phone]) [facility] Morene Crocker, MD; [address], [place], [place] [address] [phone] Mon-Fri 8:00-5:00 Medicaid - No; Tricare - Yes  [facility], [place]; [place], [place] [address] [place], [place] [address] [phone] Mon-Fri 8:00-5:00 Medicaid - No, Tricare - Yes  [facility] Cardell Peach, MD; Nash Dimmer, MD; [place], [place] [address] [place], [place] [address] [phone]  Mon-Fri 8:00-5:00 Medicaid - No; Tricare - Yes  [facility] [address] [place], [place] [address] [phone] Mon-Fri 8:30-5:00 (lunch 12:00-1:00) Medicaid -Yes; Tricare - Yes  [facility], MD [address] [place], [place], [place] [address] [phone] Mon-Fri 8:00-5:00 Seeing newborns of current patients only. No new patients Medicaid - No, Tricare - yes  Nature conservation officer at [place]  [address], MD [address] [place], [place] [address] [phone] Mon-Fri 8:00-5:00 Medicaid -yes as secondary  coverage only; Tricare - yes  [facility] [place], [place]; [place], [place]; Avis Epley, MD; Vonna Kotyk, MD; Clance Boll, MD; [place], [place]; [place], NP; Vaughan Basta, MD; [place], MD [address] [place], [place] [address] [phone] Mon-Fri 8:30-5:00, Sat 9:00-11:00 Accepts commercial insurance ONLY. Offers free prenatal information sessions for families. Medicaid - No, Tricare - Call first  [facility] [place], MD; [place], [place]; [place], [place]; [place], [place] [address] [place] [address] [phone] Mon-Fri 7:30-5:30 Medicaid - Yes; Ailene Rud yes  [place] [phone] & [phone])  [facility], MD [address], [place], [place] [address] [phone] Mon-Thur 8:00-6:00, closed for lunch 12-2, closed Fridays Medicaid - yes; Tricare - no  [facility] Dareen Piano, NP; Cyndia Bent, MD; [place], [place]; [place], [place] [address], [place], [place] [address] [phone] Mon-Fri 7:30-4:30 Medicaid - yes, Tricare - yes  [facility]  Juanito Doom, MD; Janene Harvey, NP; Vonita Moss, MD; Donn Pierini, NP [address], [place], [place] [address] [phone] Mon-Fri 8:30-5:00, closed for lunch 1-2, Sat 8:30-12:00 - sick visits only Providers come to see babies at [facility] Only accepting newborns of siblings and first time parents ONLY if who have met with office prior to delivery Medicaid -Yes; Tricare - yes  [facility] - [place], [place]; Spero Geralds, NP; Earlene Plater, MD; Lucretia Roers, MD:  [address], [place], [place] [address] [phone] Mon- Fri 8:00-5:00, Sat 9:00-12:00 - sick visits only Accepting siblings of established patients and first time mom/baby Medicaid - Yes; Tricare - yes Patients must have vaccinations (baby vaccines)  [place] [phone] & [phone])  Adult nurse HealthCare at [facility] [address] [place], [place] [address] [phone] Mon-Fri 8:00-5:00 Medicaid - no; Tricare - yes  [facility] [place], MD; [place], [place]; [place], [place] [address], [place], [place] [address] [phone] Mon-Fri 8:00-5:00 Medicaid- yes; Tricare - yes  [facility] - Ardeen Jourdain, MD; Yetta Barre, NP; [place], [place] [address] [place], [place], [place] [address] [phone] Mon-Fri 8:00-5:00 Medicaid - Yes; Tricare - yes  [address] [place] [phone])  [facility] [place], [place]; [place], [place]; Eddie Candle, MD; Normand Sloop, MD; [place], NP; Isenhour, DO; [place], [place]; Constance Goltz, MD; Ruthann Cancer, MD; Vear Clock, MD; [place], [place]; [place], [place]; [place], [place] [address] [facility] [address], [place], [place] [address] [phone] Mon-Fri 8:30-5:00, Sat 9:00-12:00 - sick only Please register online [email or web address] then schedule online or call office Medicaid-Yes; Tricare -yes  [facility] - Premier  D[place], MD; Romualdo Bolk, MD; [place], MD; [place], NP; [place], [place]; Antonietta Barcelona, MD; Mayford Knife, NP; Shelva Majestic, MD [address], [place], [place] [address] [phone] Mon-Fri 8:00-5:30, Sat&Sun by appointment (phones open at 8:30) Medicaid - Yes; Tricare - yes  [place] [phone] & [phone]) [facility] Mariel Aloe; [place], MD; Roger Shelter, MD; Arvilla Market, NP; [place], DO [address], [place], [place] [address] [phone] M-F 8:00 - 5:15, Sat/Sun 9-12 sick visits only Medicaid - No; Tricare - yes  [facility]  [place], PA-C; Metzger, PA-C; [place], DO; [place], PA-C; [place], PA-C; Roselyn Bering, MD [address] [place], [place] [address] [phone] Mon-Thur 8:00-7:00, Fri 8:00-5:00 Accepting Medicaid for 13 and under only   [facility] - Family Medicine at [place] [facility] [place] [place], [place]; List, FNP; Berneda Rose, MD; [place],  PA-C; Lavonia Drafts, MD; Kellie Simmering, FNP; Genevie Cheshire, FNP; Evaristo Bury, MD; Berneda Rose, MD [phone] [address] [place], [place] [address] [phone] Mon-Fri 8:30-5:30 Medicaid - Yes; Tricare - yes  [facility] - [address]  [place], [place]; Whitney Post, MD; Hennie Duos, MD; Wynne Dust, MD; [place], NP [address], [place], [place] [address] [phone] Mon-Thur 8:00-5:30, Fri 8:00-5:00, Sat 9:00-12:00 Medicaid - yes, Tricare - yes  [place] [phone])  [facility] [place], [place]; Lenise Arena, MD; [place], [place] [address], [place], [place] [address] [phone] Mon-Fri 8:00-5:00, closed for lunch 12-1 Medicaid - No; Tricare - yes  Nature conservation officer at [facility], MD [address] [place], [place] [address] [phone] Mon-Fri 8:00-5:00 Medicaid - No; Tricare - yes  [facility], MD; Tami Ribas, MD; Mariam Dollar, MD; Yetta Barre, MD [address] [facility] [address], [place], [place] [address] [phone] Mon-Fri 8:00-5:00 Medicaid- Yes; Tricare - yes  [place] [phone])  Adult nurse HealthCare at [facility], [place]; [place], MD [address] [place], [place], [place] [address] [phone] Mon-Fri 8:00-5:00 Medicaid - No; Tricare - yes  [facility] - Whitney Post - CPNP [address] [place], [place], [place] [address] [phone] Mon-Weds 8:00-6:00, Thurs-Fri 8:00-5:00, Sat 9:00-12:00 Medicaid - yes; Tricare - yes   [facility] Katharina Caper, MD; [place], [place] [address] [place], [place] [address] [phone] Mon-Fri 8:00-5:00 Medicaid - yes; Tricare - yes  [facility] Pediatric Providers  [facility] [address] [place], [place] [address] [phone] Sheral Flow: 8am -8pm, Tues, Weds: 8am - 5pm; Fri:  8-1 Medicaid - Yes; Tricare - yes  [facility] Rachel Bo, MD; Laural Benes, MD; Anner Crete, MD; [place], [place]; [place], [place] [address] [place], [place] [address] [phone] M-F 8:30 - 5:00 Medicaid - Call office; Tricare -yes  [facility] Edson Snowball, MD; Shanon Rosser, MD, Chelsea Primus, MD; Shirlyn Goltz, PNP; Wardell Heath, NP [phone] S[address], [place], [place] [address] [phone] M-F 8:30 - 5:00, Sat/Sun 8:30 - 12:30 (sick visits) Medicaid - Call office; Tricare -yes  [facility] Melvyn Neth, MD; Karl Luke, PNP; Princess Bruins, MD; [place], [place]; [place], NP; Cynda Familia [address], [place], [place] [address] [phone] M-F 8:30 - 5:00 Medicaid - Call office; Tricare - yes  [facility] Jesusita Oka, MD; Dierdre Highman, MD; Earnest Conroy, MD; Timothy Lasso, MD; [place], MD [phone] [address], [place], [place] [address] [phone] M-Thur: 8:00 - 5:00; Fri: 8:00 - 4:00 Medicaid - yes; Tricare - yes  [facility] [address] [place], [place] [address] [phone] M-F: 8:30- 5:00, closed for lunch 12:30 - 1:00 Medicaid - yes; Tricare -yes  [facility] [address], [place], [place] [address] [phone] M-F 8:00 - 5:00 Medicaid - yes; Tricare - yes  [facility] [place], DO; [place], DO; [place], NP [address], [place], [place] [address] [phone] M-F 8:00 - 5:00, Closed 12-1 for lunch Medicaid - Call; Tricare - yes  [facility] Meredith Mody, MD [address] [place], [place] [address] [phone] M-F: 8:00-5:00, Sat: 8:00 - noon Medicaid - call; Tricare -yes  [facility] Pediatric Providers  [facility] [place], [place] [address] [place], [place], [place] [address] [phone] M-W: 8:00-5:00, Thur: 8:00 - 7:00, Fri: 8:00 - noon Medicaid - yes; Tricare - yes  [facility] - Quay Burow, FNP [phone] Main  [address] [place], [place] [address] [phone] M-F 8:00 - 5:00,  Closed for lunch 12-1 Medicaid - yes; Tricare - yes  [facility] Pediatric Providers  [facility] at [place], [place], Alinda Money, MD, [place], FNP-C [address] [facility], [address], [place], [place] [address] [phone] M-T 8:00-5:00, Wed-Fri 7:00-6:00 Medicaid - Yes; Tricare -yes  [facility] at [facility], [place]; [address] Saner [place], [place] [address] [phone] M-F 8:00 - 5:00, closed for lunch 12-1 Medicaid - Yes; Tricare - yes  [facility] and Internal Medicine  Zachery Dauer, MD; Gladstone Lighter, MD; Collie Siad, MD; Freda Jackson, MD; Rich Number, MD; Darryl Nestle, MD; Melinda Crutch, MD, Audria Nine, MD; Tawanna Cooler, MD; Steffanie Dunn, MD; Byrd Hesselbach, MD; Lucretia Roers, MD [address] [place], [place] [address] [phone] M-F 8:00-5:00 Medicaid - yes; Tricare - yes  [facility] [place], MD (speaks [place] and Hindi) [address] [place], [place] [address] [phone] M-F: 8:30 - 5:00, closed 12:30 - 1 for lunch Medicaid - Yes; Tricare -yes  [facility] Pediatric Providers  [facility] Shanda Bumps, MD; Chanetta Marshall, MD; Laurell Josephs, MD [address] [place], [place] [address] [phone] M-Th: 8:00 - 5:30, Fri: 8:00 - 12:00 Medicaid - yes; Tricare - yes  [facility] at [facility], NP; Thora Lance, MD; Orrin Brigham, MD [phone] [address], [place], [place] [address] [phone] M-F: 8:00 - 5:00 Medicaid - yes; Tricare - yes  [facility] [place], NP; Orson Slick, NP; Salley Scarlet, NP; Linton Flemings, MD; Mayford Knife, MD, [place], NP, Emelda Fear, MD; Nida Boatman [address] [place], [place] [address] [phone] M-F: 8:30 - 5:30p Medicaid - yes; Tricare - yes Other locations available as well  [facility], MD; Andrey Campanile, MD; Neville Route, PA-C [address] [place], [place] [address] [phone] M-W: 8:00am - 7:00pm, Thurs:  8:00am - 8:00pm; Fri: 8:00am - 5:00pm, closed daily from 12-1 for lunch Medicaid - yes; Tricare - yes  [facility] Pediatric Providers  [facility] at Levin Erp, MD; Aggie Cosier, FNP; Bland Span, MD; Tristan Schroeder, MD; [place], PNP; Alesia Banda; [place], [place]; Julian Reil, MD;  [address] [place], [place] [address] [phone] Judie Petit - Caleen Essex: 8am - 5pm, Sat 9-noon Medicaid - Yes; Tricare -yes  [facility] at Jaclynn Guarneri, MD; Yetta Barre, FNP; Lilian Kapur, MD; Mariam Dollar, MD [address] [place] Schl[place], [place] [phone] M-F 8:00 - 5:00 Medicaid - call; Tricare - yes  [facility]- Cruz Condon, MD; [place], [place]; Delora Fuel, MD; Dareen Piano, MD; Trudee Grip, MD; Kizzie Ide, MD; Zebedee Iba; Birdena Crandall, MD; Hinton Dyer, MD; [place], MD [address], [place], [place] [address] [phone] M-F 8:00am - 5:00pm; Sat. 9:00 - 11:00 Medicaid - yes; Tricare - yes  [facility] at [facility], MD [address], [place], [place] [address] [phone] M-F 8:00 - 5:00 Medicaid - [employer] Medicaid only; Tricare - yes  [facility]s - Illene Bolus, MD; Earlene Plater, [place]; Kenyon Ana, MD [address], [place], [place] [address] [phone] M-F 8:00 - 5:00 Medicaid - yes; Tricare - yes  [facility] [address] Pediatrics - Lind Covert, MD; Manson Passey, MD, [facility], MD, [place], MD; Noble, MD; Katrinka Blazing, MD; [address] [place], [place] [address] [phone] M-F: 8-5 Medicaid - yes; Tricare - yes  [facility] - Henrietta Hoover, [place]; [place], MD; [address] [place], [place] [address] [phone] M-F 8-5 Medicaid - yes; Tricare - yes  [address] [place] Darrol Poke, MD;  Tami Ribas, MD; [place], MD; [place], DNP; [place], PNP [address], [place], [place] [address] [phone] M-F 8-5 Medicaid - yes; Tricare - yes  [facility] and  Pediatrics Delories Heinz, MD; Adrienne Mocha; Ala Bent, MD [address], [place], [place] [address] [phone] M-F 7am - 5 pm Medicaid - call; Tricare - yes  [facility] [place], [place]; Fredia Beets, MD; Roxan Hockey, MD [address] [place], [place] [address] [phone] M-F 8-5 Medicaid - yes; Tricare - yes  [facility] Kae Heller, MD; Sheliah Hatch, MD; Mayford Knife, FNP; Shon Baton, FNP; Tyron Russell, FNP; Ishmael Holter; [facility] - FNP [address], [place], [place] [address] [phone] M-F 8-5 Medicaid- yes; Tricare - yes  [facility] - Betsy Coder, [place] and [place]mers Guest, MD; Terrial Rhodes, MD; Hulda Humphrey, MD; Roseanne Reno, MD; [employer], [place]; Ala Dach, MD; Fredia Beets, MD; Dimple Casey, MD [address] [place], [place] [address] [phone] M-F: 8-5, Sat: 9-4, Sun 9-12 Medicaid - yes; Tricare - yes  [facility] [place], PNP; Earlene Plater, PNP [address] [place], [place] [address] [phone] M-F 8 - 5, closed 12-1 for lunch Medicaid - yes; Tricare - yes  [facility] Pediatrics Kathyrn Lass, MD; Hal Neer, MD; Dimple Casey, MD; [place], DO [address] [place], [place] [address] [phone] M-F 8- 5:30 Medicaid - yes; Tricare - yes  [facility] - Biagio Quint, MD; Rosalia Hammers, MD; Gwenith Daily, MD [address], [place], [place] [address] [phone] Judie Petit: Nicholas Lose; Tues-Fri: 8-5; Sat: 9-12 Medicaid - yes; Tricare - yes  [facility] Pediatrics - Bobbye Morton, MD; Daphane Shepherd, MD; Chestine Spore, MD; Haskell Riling, MD; Kate Sable, MD [address] [place], [place] [address] [phone] Judie PetitMarland Kitchen Nicholas LoseFrancee Nodal: 8-5; Sat: 8:30-12:30 Medicaid - yes; Tricare - yes  [facility]a Heck[facility] - Beckey Rutter, MD; [place], [place] [address], [place], [place]  [address] [phone] Mon-Fri: 8-5 Medicaid - yes; Tricare - yes  [facility] Pediatrics - [place], CPNP; [place], [place]; Dimple Casey, MD; Alisa Graff, MD; Cephus Shelling, MD; [address] [place], [place] [address] [phone] M-F: 8-5, closed 1-2 for lunch Medicaid - yes; Tricare - yes  [facility] Pediatrics - [facility] [place], [place]; [place], [place]; Katrinka Blazing, MD; [place], CPNP; [place], [place]; [place], MD; Earlene Plater, MD [address], [place], [place] [address] [phone] M-Thurs: Nicholas Lose; Fri: 8-6; Sat: 9-12; Sun 2-4 Medicaid - yes; Tricare - yes  [facility] Georgeanna Lea, MD; Evette Cristal, MD; Shea Stakes, FNP; Earney Mallet, DO; [address] [place], [place] [address] [phone] M-F: 8-5 Medicaid - yes; Tricare - yes  [facility] Pediatric Providers  [facility] - Family Medicine -Collene Mares, MD; [place], NP [address], [place], [place] [address] [phone] M - Fri: 8am - 5pm, closed for lunch 12-1 Medicaid - Yes; Tricare - yes  [facility] and Pediatrics Elinor Parkinson, MD; Victory Dakin, MD; [place]anger, DO; Vinocur, MD;Hall, PA; Clent Ridges, [place]; Orvan Falconer, NP [phone] [address], [place], [place] [address] [phone] M-F 8:00 - 5:00, Sat 8:00 - 11:30 Medicaid - yes; Tricare - yes  [facility] Welton Flakes, MD; [place], MD, [address], MD, Berry, MD, [place], MD; [place], NP; [address], [place];  [address] [place], [place] [address] [phone] M-F 8:10am - 5:00pm Medicaid - yes; Tricare -  yes  [facility] [place], MD; [place], NP [address] [place], [place] [address] [phone] M-F 8:00 - 5:00 Medicaid - [place] Medicaid only; Tricare - yes  [facility] Medicine - [place] [address] [place], [place]; [place], NP [address], [place], [place] [address] [phone] M-F 8:00 -  5:00; Closed for lunch 12 - 1:00 Medicaid - yes; Tricare - yes  [facility] Belva Crome, MD; Jonita Albee, FNP [address] [place], [place] [address] [phone] Mon 9-5; Tues/Wed 10-5; Thurs 8:30-5; Fri: 8-12:30 Medicaid - yes; Tricare - yes  [facility] Pediatric Providers  [facility] [place]  [place], MD; [place], [place] [address] [place], [place] [address] [phone] phone [phone] fax M-F 7:15 - 4:30 Medicaid - yes; Tricare - yes  [facility] Karilyn Cota, MD; [place], DO [address], [place], [place] [address] [phone] M-Fri: 8:30 - 5:00, closed for lunch everyday noon - 1pm Medicaid - Yes; Tricare - yes  [facility] Burdine, MD; Reuel Boom, MD; Dimas Aguas, MD; Neita Carp, MD; [place], [place]; Bonnita Nasuti, [place]; [place], [place]; [place], [place]; [place], [place] [address] [place], [place] [address] [phone] M-Thurs: 7:30am - 7:00pm; Friday 7:30am - 4pm; Sat: 8:00 - 1:00 Medicaid - Yes; Tricare - yes  [facility] Nor[facility] Morton, MD; Conni Elliot, MD; Carroll Kinds, MD; [place], DO [address], [place], [place] [address] [phone] M-Thur: 8:00 - 5:00, Fri: 8:00 - Noon Medicaid - yes; Tricare - yes No [place] [employer]  [place] [facility] Dettinger, MD; Nadine Counts, DO; [place], NP; Daphine Deutscher, NP; Lequita Halt, NP; Ellamae Sia, NP; Reginia Forts, NP; Darlyn Read, MD; [place], [place] [address], [place], [place] [address] [phone] M-F 8:00 - 5:00 Medicaid - yes; Tricare - yes  [facility] Health Care - [facility], FNP-C; [place], FNP-C [address] [place], [place] [address] [phone] M, W, R 8:00-5:00, Tues: 8:00am - 7:00pm; Fri 8:00 - noon Medicaid - Yes; Tricare - yes  [facility], MD [address] [place], [place] [address] [phone]  M-Thurs 8:30-5:30, Fri: 8:30-12:30pm Medicaid - Yes; Tricare - N

## 2023-12-25 NOTE — Progress Notes (Addendum)
   PRENATAL VISIT NOTE  Subjective:  Connie Fernandez is a 28 y.o. G1P0000 at [redacted]w[redacted]d being seen today for ongoing prenatal care.  She is currently monitored for the following issues for this low-risk pregnancy and has Anxiety; Major depressive disorder, recurrent episode (HCC); Bipolar 2 disorder (HCC); Opioid use disorder, mild, abuse (HCC); Supervision of normal first pregnancy, antepartum; and Rubella non-immune status, antepartum on their problem list.  Patient reports no complaints.  Contractions: Irritability. Vag. Bleeding: None.  Movement: Present. Denies leaking of fluid.   The following portions of the patient's history were reviewed and updated as appropriate: allergies, current medications, past family history, past medical history, past social history, past surgical history and problem list.   Objective:   Vitals:   12/25/23 0848  BP: 123/78  Pulse: 77  Weight: 135 lb (61.2 kg)    Fetal Status: Fetal Heart Rate (bpm): 138 Fundal Height: 26 cm Movement: Present     General:  Alert, oriented and cooperative. Patient is in no acute distress.  Skin: Skin is warm and dry. No rash noted.   Cardiovascular: Normal heart rate noted  Respiratory: Normal respiratory effort, no problems with respiration noted  Abdomen: Soft, gravid, appropriate for gestational age.  Pain/Pressure: Absent     Pelvic: Cervical exam deferred        Extremities: Normal range of motion.  Edema: None  Mental Status: Normal mood and affect. Normal behavior. Normal judgment and thought content.   Assessment and Plan:  Pregnancy: G1P0000 at [redacted]w[redacted]d 1. Supervision of normal first pregnancy, antepartum (Primary) Continue routine prenatal care. 28 week labs today TDaP next visit Discussed pp contraception, peds, childbirth classes Decrease nipple stimulation  Sleep discomforts  2. Rubella non-immune status, antepartum MMR pp  3. [redacted] weeks gestation of pregnancy   Preterm labor symptoms and general  obstetric precautions including but not limited to vaginal bleeding, contractions, leaking of fluid and fetal movement were reviewed in detail with the patient. Please refer to After Visit Summary for other counseling recommendations.   Return in 2 weeks (on 01/08/2024).  No future appointments.  Reva Bores, MD

## 2023-12-25 NOTE — Progress Notes (Signed)
ROB   CC: Not able to sleep. Leaking yellowish/clear liquid from breast.   T-Dap :Declined.

## 2023-12-26 LAB — GLUCOSE TOLERANCE, 2 HOURS W/ 1HR
Glucose, 1 hour: 147 mg/dL (ref 70–179)
Glucose, 2 hour: 121 mg/dL (ref 70–152)
Glucose, Fasting: 79 mg/dL (ref 70–91)

## 2023-12-26 LAB — CBC
Hematocrit: 33.6 % — ABNORMAL LOW (ref 34.0–46.6)
Hemoglobin: 11.3 g/dL (ref 11.1–15.9)
MCH: 30.3 pg (ref 26.6–33.0)
MCHC: 33.6 g/dL (ref 31.5–35.7)
MCV: 90 fL (ref 79–97)
Platelets: 226 10*3/uL (ref 150–450)
RBC: 3.73 x10E6/uL — ABNORMAL LOW (ref 3.77–5.28)
RDW: 12.7 % (ref 11.7–15.4)
WBC: 9.5 10*3/uL (ref 3.4–10.8)

## 2023-12-26 LAB — RPR: RPR Ser Ql: NONREACTIVE

## 2023-12-26 LAB — HIV ANTIBODY (ROUTINE TESTING W REFLEX): HIV Screen 4th Generation wRfx: NONREACTIVE

## 2024-01-09 ENCOUNTER — Encounter: Payer: Medicaid Other | Admitting: Obstetrics and Gynecology

## 2024-01-14 ENCOUNTER — Encounter: Payer: Self-pay | Admitting: *Deleted

## 2024-01-14 ENCOUNTER — Encounter: Payer: Self-pay | Admitting: Obstetrics & Gynecology

## 2024-01-23 ENCOUNTER — Ambulatory Visit: Payer: Medicaid Other | Admitting: Obstetrics & Gynecology

## 2024-01-23 ENCOUNTER — Encounter: Payer: Self-pay | Admitting: Obstetrics & Gynecology

## 2024-01-23 VITALS — BP 113/71 | HR 86 | Wt 138.0 lb

## 2024-01-23 DIAGNOSIS — Z3A30 30 weeks gestation of pregnancy: Secondary | ICD-10-CM | POA: Diagnosis not present

## 2024-01-23 DIAGNOSIS — Z3400 Encounter for supervision of normal first pregnancy, unspecified trimester: Secondary | ICD-10-CM

## 2024-01-23 NOTE — Progress Notes (Signed)
   PRENATAL VISIT NOTE  Subjective:  Connie Fernandez is a 28 y.o. G1P0000 at [redacted]w[redacted]d being seen today for ongoing prenatal care.  She is currently monitored for the following issues for this low-risk pregnancy and has Anxiety; Major depressive disorder, recurrent episode (HCC); Bipolar 2 disorder (HCC); Opioid use disorder, mild, abuse (HCC); Supervision of normal first pregnancy, antepartum; and Rubella non-immune status, antepartum on their problem list.  Patient reports no complaints.  Contractions: Irritability. Vag. Bleeding: None.  Movement: Present. Denies leaking of fluid. Worried about her weight gain and baby's weight.  She is eating well.  The following portions of the patient's history were reviewed and updated as appropriate: allergies, current medications, past family history, past medical history, past social history, past surgical history and problem list.   Objective:   Vitals:   01/23/24 1351  BP: 113/71  Pulse: 86  Weight: 138 lb (62.6 kg)   Fetal Status: Fetal Heart Rate (bpm): 144 Fundal Height: 30 cm Movement: Present     General:  Alert, oriented and cooperative. Patient is in no acute distress.  Skin: Skin is warm and dry. No rash noted.   Cardiovascular: Normal heart rate noted  Respiratory: Normal respiratory effort, no problems with respiration noted  Abdomen: Soft, gravid, appropriate for gestational age.  Pain/Pressure: Absent     Pelvic: Cervical exam deferred        Extremities: Normal range of motion.  Edema: None  Mental Status: Normal mood and affect. Normal behavior. Normal judgment and thought content.   Assessment and Plan:  Pregnancy: G1P0000 at [redacted]w[redacted]d 1. [redacted] weeks gestation of pregnancy 2. Supervision of normal first pregnancy, antepartum (Primary) Reassured about her weight gain and normal fundal height.  Will continue healthy eating habits.  Normal third trimester labs. Preterm labor symptoms and general obstetric precautions including but  not limited to vaginal bleeding, contractions, leaking of fluid and fetal movement were reviewed in detail with the patient. Please refer to After Visit Summary for other counseling recommendations.   Return in about 2 weeks (around 02/06/2024) for OFFICE OB VISIT (MD or APP).  Future Appointments  Date Time Provider Department Center  02/06/2024  3:30 PM Tereso Newcomer, MD [facility]  02/19/2024  1:30 PM Reva Bores, MD [facility]  03/04/2024  1:30 PM Reva Bores, MD [facility]  03/11/2024  2:50 PM Reva Bores, MD [facility]  03/18/2024  3:30 PM Macon Large, Jethro Bastos, MD [facility]  03/25/2024  3:50 PM Reva Bores, MD [facility]    Jaynie Collins, MD

## 2024-01-23 NOTE — Patient Instructions (Signed)

## 2024-02-06 ENCOUNTER — Ambulatory Visit: Payer: Medicaid Other | Admitting: Obstetrics & Gynecology

## 2024-02-06 ENCOUNTER — Encounter: Payer: Self-pay | Admitting: Obstetrics & Gynecology

## 2024-02-06 VITALS — BP 125/72 | HR 93 | Wt 140.0 lb

## 2024-02-06 DIAGNOSIS — Z3400 Encounter for supervision of normal first pregnancy, unspecified trimester: Secondary | ICD-10-CM

## 2024-02-06 DIAGNOSIS — Z3A32 32 weeks gestation of pregnancy: Secondary | ICD-10-CM | POA: Diagnosis not present

## 2024-02-06 DIAGNOSIS — F1920 Other psychoactive substance dependence, uncomplicated: Secondary | ICD-10-CM

## 2024-02-06 DIAGNOSIS — O99320 Drug use complicating pregnancy, unspecified trimester: Secondary | ICD-10-CM | POA: Diagnosis not present

## 2024-02-06 NOTE — Patient Instructions (Signed)

## 2024-02-06 NOTE — Progress Notes (Signed)
   PRENATAL VISIT NOTE  Subjective:  Connie Fernandez is a 28 y.o. G1P0000 at [redacted]w[redacted]d being seen today for ongoing prenatal care.  She is currently monitored for the following issues for this low-risk pregnancy and has Anxiety; Major depressive disorder, recurrent episode (HCC); Bipolar 2 disorder (HCC); Substance dependence during pregnancy (HCC); Supervision of normal first pregnancy, antepartum; and Rubella non-immune status, antepartum on their problem list.  Patient reports  having to take Kratom more lately, was on this prior to pregnancy and she discontinued it. Had bad withdrawal symptoms, so taking it again. Wants to wean off it and wants more information about how to do this safely and minimize risk of neonatal withdrawal .  Contractions: Not present. Vag. Bleeding: None.  Movement: Present. Denies leaking of fluid.   The following portions of the patient's history were reviewed and updated as appropriate: allergies, current medications, past family history, past medical history, past social history, past surgical history and problem list.   Objective:   Vitals:   02/06/24 1530  BP: 125/72  Pulse: 93  Weight: 140 lb (63.5 kg)    Fetal Status: Fetal Heart Rate (bpm): 130 Fundal Height: 31 cm Movement: Present     General:  Alert, oriented and cooperative. Patient is in no acute distress.  Skin: Skin is warm and dry. No rash noted.   Cardiovascular: Normal heart rate noted  Respiratory: Normal respiratory effort, no problems with respiration noted  Abdomen: Soft, gravid, appropriate for gestational age.  Pain/Pressure: Absent     Pelvic: Cervical exam deferred        Extremities: Normal range of motion.  Edema: None  Mental Status: Normal mood and affect. Normal behavior. Normal judgment and thought content.   Assessment and Plan:  Pregnancy: G1P0000 at [redacted]w[redacted]d 1. Substance dependence during pregnancy [facility]) Patient was referred to [facility] clinic given her dependence on Kratom  (had opioid/stimulant effects). Will follow up their recommendations and manage accordingly. MFm scan also ordered - US MFM OB FOLLOW UP; Future  2. [redacted] weeks gestation of pregnancy 3. Supervision of normal first pregnancy, antepartum (Primary) No other concerns today.  Third trimester expectations reviewed and all questions answered. Preterm labor symptoms and general obstetric precautions including but not limited to vaginal bleeding, contractions, leaking of fluid and fetal movement were reviewed in detail with the patient. Please refer to After Visit Summary for other counseling recommendations.   Return in about 2 weeks (around 02/20/2024) for OFFICE OB VISIT (MD only).  Future Appointments  Date Time Provider Department Center  02/19/2024  1:30 PM Reva Bores, MD [facility]  03/04/2024  1:30 PM Reva Bores, MD [facility]  03/11/2024  2:50 PM Reva Bores, MD [facility]  03/18/2024  3:30 PM Macon Large, Jethro Bastos, MD [facility]  03/25/2024  3:50 PM Reva Bores, MD [facility]    Jaynie Collins, MD

## 2024-02-19 ENCOUNTER — Ambulatory Visit: Payer: Medicaid Other | Admitting: Family Medicine

## 2024-02-19 VITALS — BP 119/71 | HR 88

## 2024-02-19 DIAGNOSIS — O99320 Drug use complicating pregnancy, unspecified trimester: Secondary | ICD-10-CM | POA: Diagnosis not present

## 2024-02-19 DIAGNOSIS — Z3400 Encounter for supervision of normal first pregnancy, unspecified trimester: Secondary | ICD-10-CM | POA: Diagnosis not present

## 2024-02-19 DIAGNOSIS — O09899 Supervision of other high risk pregnancies, unspecified trimester: Secondary | ICD-10-CM | POA: Diagnosis not present

## 2024-02-19 DIAGNOSIS — Z3A34 34 weeks gestation of pregnancy: Secondary | ICD-10-CM | POA: Diagnosis not present

## 2024-02-19 DIAGNOSIS — Z2839 Other underimmunization status: Secondary | ICD-10-CM

## 2024-02-19 DIAGNOSIS — F1920 Other psychoactive substance dependence, uncomplicated: Secondary | ICD-10-CM

## 2024-02-19 NOTE — Progress Notes (Signed)
   PRENATAL VISIT NOTE  Subjective:  Gilberto Streck is a 28 y.o. G1P0000 at [redacted]w[redacted]d being seen today for ongoing prenatal care.  She is currently monitored for the following issues for this high-risk pregnancy and has Anxiety; Major depressive disorder, recurrent episode (HCC); Bipolar 2 disorder (HCC); Substance dependence during pregnancy (HCC); Supervision of normal first pregnancy, antepartum; and Rubella non-immune status, antepartum on their problem list.  Patient reports no complaints.  Contractions: Not present. Vag. Bleeding: None.  Movement: Present. Denies leaking of fluid.   The following portions of the patient's history were reviewed and updated as appropriate: allergies, current medications, past family history, past medical history, past social history, past surgical history and problem list.   Objective:   Vitals:   02/19/24 1338  BP: 119/71  Pulse: 88    Fetal Status: Fetal Heart Rate (bpm): 130 Fundal Height: 34 cm Movement: Present     General:  Alert, oriented and cooperative. Patient is in no acute distress.  Skin: Skin is warm and dry. No rash noted.   Cardiovascular: Normal heart rate noted  Respiratory: Normal respiratory effort, no problems with respiration noted  Abdomen: Soft, gravid, appropriate for gestational age.  Pain/Pressure: Absent     Pelvic: Cervical exam deferred        Extremities: Normal range of motion.     Mental Status: Normal mood and affect. Normal behavior. Normal judgment and thought content.   Assessment and Plan:  Pregnancy: G1P0000 at [redacted]w[redacted]d 1. Supervision of normal first pregnancy, antepartum (Primary) Continue routine prenatal care.  2. Rubella non-immune status, antepartum MMR pp  3. Substance dependence during pregnancy (HCC) Using Kratom, referral to [facility] obtained.  4. [redacted] weeks gestation of pregnancy    labor symptoms and general obstetric precautions including but not limited to vaginal bleeding, contractions,  leaking of fluid and fetal movement were reviewed in detail with the patient. Please refer to After Visit Summary for other counseling recommendations.   Return in 2 weeks (on 03/04/2024).  Future Appointments  Date Time Provider Department Center  02/25/2024  1:55 PM Teena Feast, MD [facility]  02/28/2024 11:00 AM [facility] PROVIDER 1 [facility]  02/28/2024 11:30 AM [facility] US6 [facility]  03/04/2024  1:30 PM Granville Layer, MD [facility]  03/11/2024  2:50 PM Granville Layer, MD [facility]  03/18/2024  3:30 PM Anyanwu, Kathrine Paris, MD [facility]  03/25/2024  3:50 PM Granville Layer, MD [facility]    Granville Layer, MD

## 2024-02-20 ENCOUNTER — Encounter: Payer: Self-pay | Admitting: *Deleted

## 2024-02-25 ENCOUNTER — Ambulatory Visit: Admitting: Family Medicine

## 2024-02-25 ENCOUNTER — Other Ambulatory Visit: Payer: Self-pay

## 2024-02-25 VITALS — BP 120/81 | HR 90 | Wt 139.8 lb

## 2024-02-25 DIAGNOSIS — F1120 Opioid dependence, uncomplicated: Secondary | ICD-10-CM | POA: Diagnosis not present

## 2024-02-25 DIAGNOSIS — O99323 Drug use complicating pregnancy, third trimester: Secondary | ICD-10-CM | POA: Diagnosis not present

## 2024-02-25 DIAGNOSIS — F1191 Opioid use, unspecified, in remission: Secondary | ICD-10-CM | POA: Insufficient documentation

## 2024-02-25 DIAGNOSIS — Z2839 Other underimmunization status: Secondary | ICD-10-CM

## 2024-02-25 DIAGNOSIS — Z3A35 35 weeks gestation of pregnancy: Secondary | ICD-10-CM

## 2024-02-25 DIAGNOSIS — O09893 Supervision of other high risk pregnancies, third trimester: Secondary | ICD-10-CM

## 2024-02-25 DIAGNOSIS — F1920 Other psychoactive substance dependence, uncomplicated: Secondary | ICD-10-CM

## 2024-02-25 DIAGNOSIS — F3181 Bipolar II disorder: Secondary | ICD-10-CM

## 2024-02-25 DIAGNOSIS — Z3400 Encounter for supervision of normal first pregnancy, unspecified trimester: Secondary | ICD-10-CM

## 2024-02-25 DIAGNOSIS — O09899 Supervision of other high risk pregnancies, unspecified trimester: Secondary | ICD-10-CM

## 2024-02-25 MED ORDER — BUPRENORPHINE HCL-NALOXONE HCL 8-2 MG SL FILM
1.0000 | ORAL_FILM | Freq: Three times a day (TID) | SUBLINGUAL | 0 refills | Status: DC
  Filled 2024-02-25 – 2024-02-28 (×2): qty 30, 10d supply, fill #0

## 2024-02-25 NOTE — Patient Instructions (Addendum)
 Instruction for starting buprenorphine -naloxone  (Suboxone ) at home  You should not mix buprenorphine -naloxone  with other drugs especially large amounts of alcohol or benzodiazepines (Valium, Klonopin, Xanax, Ativan ). If you have taken any of these medication, please tell your healthcare team and do not take buprenorphine -naloxone .   You must wait until you are feeling signs of withdrawal from opiates (heroin, pain pills, fentanyl ) before you take buprenorphine -naloxone .  If you do not wait long enough the medication will make you sicker.  If you do take it too soon and get sicker then wait until later when you feel signs of withdrawal listed below and then try again.   Signs that you are withdrawing: Anxiety, restlessness, can't sit still Aches Nausea or sick to your stomach Diarrhea Goose-bumps Racing heart  Frequent yawning  You should have ALL of these symptoms before you start taking your first dose of buprenorphine -naloxone . If you are not sure call your healthcare team.    When it's time to take your first dose Take 2 films or tablets depending on what you have discussed with your doctor Make sure your mouth is empty of everything (no candy/gum/etc) Sit or stand, but do not lie down Swallow a sip of water to wet your mouth  Put the tablet(s) or film(s) under your tongue. Do not suck or swallow it. It must stay there until it is completely dissolved. Try to not even swallow your spit during this time. Anything that you swallow will not make you feel better.   In one hour: You should start feeling a little better. You can take the another pill or film the same way you took the first one if you do not feel total relief of your withdrawal symptoms.   In 2 hours: if you are still feeling symptoms of withdrawal listed above you can take another full pill or film. You can repeat this if needed until you take a total dose of up to 24 mg of Suboxone . You may need less than this to control your  symptoms.  You should adjust your daily dose so that you are are not experiencing withdrawal symptoms or cravings.   The next day:  In the morning you can take the same amount you took yesterday but spread out evenly over the day.    If you have any questions or concerns at any time call your healthcare team.  Office Number:  [phone]

## 2024-02-25 NOTE — Progress Notes (Signed)
   Subjective:  Connie Fernandez is a 28 y.o. G1P0000 at [redacted]w[redacted]d being seen today for ongoing prenatal care. She transferring care from [facility]. She is currently monitored for the following issues for this high-risk pregnancy and has Anxiety; Major depressive disorder, recurrent episode (HCC); Bipolar 2 disorder (HCC); Substance dependence during pregnancy (HCC); Supervision of normal first pregnancy, antepartum; and Rubella non-immune status, antepartum on their problem list.  Taking Kratom. Tried to to stop but had severe withdrawal Sx.   Patient reports no complaints.  Contractions: Not present. Vag. Bleeding: None.  Movement: Present. Denies leaking of fluid.   The following portions of the patient's history were reviewed and updated as appropriate: allergies, current medications, past family history, past medical history, past social history, past surgical history and problem list. Problem list updated.  Objective:   Vitals:   02/25/24 1411  BP: 120/81  Pulse: 90  Weight: 139 lb 12.8 oz (63.4 kg)    Fetal Status: Fetal Heart Rate (bpm): 125   Movement: Present     General:  Alert, oriented and cooperative. Patient is in no acute distress.  Skin: Skin is warm and dry. No rash noted.   Cardiovascular: Normal heart rate noted  Respiratory: Normal respiratory effort, no problems with respiration noted  Abdomen: Soft, gravid, appropriate for gestational age. Pain/Pressure: Present     Pelvic: Vag. Bleeding: None     Cervical exam deferred        Extremities: Normal range of motion.  Edema: None  Mental Status: Normal mood and affect. Normal behavior. Normal judgment and thought content.   Urinalysis:      PDMP reviewed during this encounter.   Last UDS: No results found for: "CREATIUR"   Assessment and Plan:  Pregnancy: G1P0000 at [redacted]w[redacted]d  1. Supervision of normal first pregnancy, antepartum (Primary) - Buprenorphine  HCl-Naloxone  HCl (SUBOXONE ) 8-2 MG FILM; Place 1 Film  under the tongue 3 (three) times daily for 10 days.  Dispense: 30 each; Refill: 0 - ToxAssure Flex 15, Ur 2. Substance dependence during pregnancy (HCC) - Growth US  4/25  3. Rubella non-immune status, antepartum - MMR PP  4. Bipolar 2 disorder (HCC)  5. [redacted] weeks gestation of pregnancy - Plans to continue routine prenatal care at [facility], Will have Suboxone  managed by [facility].   6. Suboxone  maintenance treatment complicating pregnancy, antepartum, third trimester (HCC) - Buprenorphine  HCl-Naloxone  HCl (SUBOXONE ) 8-2 MG FILM; Place 1 Film under the tongue 3 (three) times daily for 10 days.  Dispense: 30 each; Refill: 0 - ToxAssure Flex 15, Ur  Preterm labor symptoms and general obstetric precautions including but not limited to vaginal bleeding, contractions, leaking of fluid and fetal movement were reviewed in detail with the patient. Please refer to After Visit Summary for other counseling recommendations.   Future Appointments  Date Time Provider Department Center  02/28/2024 11:00 AM [facility] PROVIDER 1 [facility]  02/28/2024 11:30 AM [facility] US6 [facility]  03/03/2024  3:55 PM Teena Feast, MD [facility]  03/04/2024  1:30 PM Granville Layer, MD [facility]  03/11/2024  2:50 PM Granville Layer, MD [facility]  03/18/2024  3:30 PM Anyanwu, Kathrine Paris, MD [facility]  03/25/2024  3:50 PM Granville Layer, MD [facility]   [email or web address]  Sirenia Whitis , CNM

## 2024-02-25 NOTE — Progress Notes (Signed)
   Subjective:   Connie Fernandez is a 28 y.o. G1P0000 here today for initial [facility] introduction and orientation to [facility] team.   Health Maintenance Due  Topic Date Due   Pneumococcal Vaccine 63-8 Years old (1 of 2 - PCV) Never done   DTaP/Tdap/Td (2 - Td or Tdap) 01/20/2021   COVID-19 Vaccine (1 - 2024-25 season) Never done    Past Medical History:  Diagnosis Date   Acne 02/19/2017   Acute cystitis with hematuria 07/25/2017   Anemia    Anxiety    Chronic pelvic pain in female    Had negative laparoscopy for endometriosis in 2017   Depression    Dysmenorrhea 02/19/2017   Gastritis 08/01/2021   Headache    Migraines   Heart palpitations    History of drug use 07/25/2017   History of drug use 07/25/2017   Opioid use disorder, mild, abuse (HCC) 01/19/2020   Pelvic floor dysfunction 10/05/2016   Primary dysmenorrhea 10/05/2016   Negative laparoscopy for endometriosis     Tobacco use disorder 10/28/2015    Past Surgical History:  Procedure Laterality Date   LAPAROSCOPY N/A 06/12/2016   Procedure: LAPAROSCOPY DIAGNOSTIC;  Surgeon: Ana Balling, MD;  Location: [facility];  Service: Gynecology;  Laterality: N/A;    The following portions of the patient's history were reviewed and updated as appropriate: allergies, current medications, past family history, past medical history, past social history, past surgical history and problem list.     Objective:   Connie Fernandez is well appearing in no acute distress. She has linear thinking and clear communication.      Assessment and Plan:  Met with Connie Fernandez today at [facility] for initial substance exposed newborn consult. We discussed her ongoing prenatal care at [facility] and her usage of Kratom. Introduced [facility] clinic, including [facility] consult phone number, and strategies following delivery including eat, sleep, consol.   Encouraged Connie Fernandez to contact [facility] consult phone in between appointments with any further questions or  concerns.     Problem List Items Addressed This Visit       Other   Bipolar 2 disorder (HCC)   Substance dependence during pregnancy [facility])   Supervision of normal first pregnancy, antepartum - Primary   Rubella non-immune status, antepartum   Other Visit Diagnoses       [redacted] weeks gestation of pregnancy           Routine preventative health maintenance measures emphasized. Please refer to After Visit Summary for other counseling recommendations.   Return in about 1 week (around 03/03/2024) for [facility] clinic.    Total face-to-face time with patient: 20 minutes.  Over 50% of encounter was spent on counseling and coordination of care.   Jayson Michael, NNP-BC Neonatal Nurse Practitioner Substance Exposed Newborn Consult at the [facility] [phone]

## 2024-02-28 ENCOUNTER — Other Ambulatory Visit: Payer: Self-pay | Admitting: Obstetrics & Gynecology

## 2024-02-28 ENCOUNTER — Other Ambulatory Visit: Payer: Self-pay | Admitting: *Deleted

## 2024-02-28 ENCOUNTER — Other Ambulatory Visit: Payer: Self-pay

## 2024-02-28 ENCOUNTER — Ambulatory Visit: Attending: Obstetrics & Gynecology

## 2024-02-28 ENCOUNTER — Ambulatory Visit: Admitting: Obstetrics

## 2024-02-28 ENCOUNTER — Encounter: Payer: Self-pay | Admitting: Obstetrics & Gynecology

## 2024-02-28 ENCOUNTER — Ambulatory Visit: Admitting: *Deleted

## 2024-02-28 VITALS — BP 125/71 | HR 113

## 2024-02-28 DIAGNOSIS — Z2839 Other underimmunization status: Secondary | ICD-10-CM | POA: Insufficient documentation

## 2024-02-28 DIAGNOSIS — O99320 Drug use complicating pregnancy, unspecified trimester: Secondary | ICD-10-CM | POA: Diagnosis not present

## 2024-02-28 DIAGNOSIS — O283 Abnormal ultrasonic finding on antenatal screening of mother: Secondary | ICD-10-CM | POA: Insufficient documentation

## 2024-02-28 DIAGNOSIS — Z3A35 35 weeks gestation of pregnancy: Secondary | ICD-10-CM

## 2024-02-28 DIAGNOSIS — O365930 Maternal care for other known or suspected poor fetal growth, third trimester, not applicable or unspecified: Secondary | ICD-10-CM | POA: Insufficient documentation

## 2024-02-28 DIAGNOSIS — Z3A32 32 weeks gestation of pregnancy: Secondary | ICD-10-CM

## 2024-02-28 DIAGNOSIS — O99323 Drug use complicating pregnancy, third trimester: Secondary | ICD-10-CM

## 2024-02-28 DIAGNOSIS — F1920 Other psychoactive substance dependence, uncomplicated: Secondary | ICD-10-CM

## 2024-02-28 DIAGNOSIS — O358XX0 Maternal care for other (suspected) fetal abnormality and damage, not applicable or unspecified: Secondary | ICD-10-CM | POA: Diagnosis not present

## 2024-02-28 DIAGNOSIS — F1120 Opioid dependence, uncomplicated: Secondary | ICD-10-CM | POA: Insufficient documentation

## 2024-02-28 DIAGNOSIS — Z363 Encounter for antenatal screening for malformations: Secondary | ICD-10-CM | POA: Insufficient documentation

## 2024-02-28 NOTE — Procedures (Signed)
 Connie Fernandez [birth year] [redacted]w[redacted]d  Fetus A Non-Stress Test Interpretation for 02/28/24  Indication: Unsatisfactory BPP  Fetal Heart Rate A Mode: External Baseline Rate (A): 125 bpm Variability: Moderate Accelerations: 15 x 15 Decelerations: Variable Multiple birth?: No  Uterine Activity Mode: Palpation, Toco Contraction Frequency (min): 1 UC w/UI Contraction Quality: Mild Resting Tone Palpated: Relaxed Resting Time: Adequate  Interpretation (Fetal Testing) Nonstress Test Interpretation: Reactive Comments: Dr. Grayland Le reviewed tracing.

## 2024-02-28 NOTE — Progress Notes (Signed)
 MFM Consult Note  Connie Fernandez is currently at 35 weeks and 4 days.  She was seen due to maternal treatment with Suboxone  replacement therapy.    She reports feeling fetal movements throughout the day.    On today's exam, the EFW of 4 pounds 12 ounces measures at the 5th percentile for her gestational age indicating IUGR.      The total AFI was 14.06 cm (within normal limits).  A BPP performed today was 8 out of 10 with a reactive NST.  She received a -2 for fetal breathing movements that did not meet criteria.  Doppler studies of the umbilical arteries showed a normal S/D ratio of 2.68 .  There were no signs of absent or reversed end-diastolic flow.    The patient was reassured that IUGR is a common finding.  Most cases of IUGR result in the delivery of a healthy infant at or close to term.    The increased risk of an IUFD associated with IUGR was discussed.  She was advised to continue to monitor fetal movements on a daily basis.  Due to IUGR, delivery will be recommended at around 38 weeks (in 2 weeks).  The patient will discuss scheduling an induction with you during her next prenatal visit.  We will continue to follow her with weekly fetal testing and umbilical artery Doppler studies.    She will return in 1 week for another BPP and umbilical artery Doppler study.    The patient stated that all of her questions were answered.    A total of 20 minutes was spent counseling and coordinating the care for this patient.  Greater than 50% of the time was spent in direct face-to-face contact.

## 2024-02-29 ENCOUNTER — Encounter: Payer: Self-pay | Admitting: Advanced Practice Midwife

## 2024-02-29 LAB — TOXASSURE FLEX 15, UR
6-ACETYLMORPHINE IA: NEGATIVE ng/mL
7-aminoclonazepam: NOT DETECTED ng/mg{creat}
AMPHETAMINES IA: NEGATIVE ng/mL
Alpha-hydroxyalprazolam: NOT DETECTED ng/mg{creat}
Alpha-hydroxymidazolam: NOT DETECTED ng/mg{creat}
Alpha-hydroxytriazolam: NOT DETECTED ng/mg{creat}
Alprazolam: NOT DETECTED ng/mg{creat}
BARBITURATES IA: NEGATIVE ng/mL
BUPRENORPHINE: NEGATIVE
Benzodiazepines: NEGATIVE
Buprenorphine: NOT DETECTED ng/mg{creat}
CANNABINOIDS IA: NEGATIVE ng/mL
Clonazepam: NOT DETECTED ng/mg{creat}
Creatinine: 177 mg/dL
Desalkylflurazepam: NOT DETECTED ng/mg{creat}
Desmethyldiazepam: NOT DETECTED ng/mg{creat}
Desmethylflunitrazepam: NOT DETECTED ng/mg{creat}
Diazepam: NOT DETECTED ng/mg{creat}
ETHYL ALCOHOL Enzymatic: NEGATIVE g/dL
FENTANYL: NEGATIVE
Fentanyl: NOT DETECTED ng/mg{creat}
Flunitrazepam: NOT DETECTED ng/mg{creat}
Lorazepam: NOT DETECTED ng/mg{creat}
METHADONE IA: NEGATIVE ng/mL
METHADONE MTB IA: NEGATIVE ng/mL
Midazolam: NOT DETECTED ng/mg{creat}
Norbuprenorphine: NOT DETECTED ng/mg{creat}
Norfentanyl: NOT DETECTED ng/mg{creat}
OPIATE CLASS IA: NEGATIVE ng/mL
OXYCODONE CLASS IA: NEGATIVE ng/mL
Oxazepam: NOT DETECTED ng/mg{creat}
PHENCYCLIDINE IA: NEGATIVE ng/mL
TAPENTADOL, IA: NEGATIVE ng/mL
TRAMADOL IA: NEGATIVE ng/mL
Temazepam: NOT DETECTED ng/mg{creat}

## 2024-02-29 LAB — COCAINE AND MTB, MS, UR RFX
Benzoylecgonine: 111 ng/mg{creat}
Cocaethylene: NOT DETECTED ng/mg{creat}
Cocaine Confirmation: POSITIVE
Cocaine: NOT DETECTED ng/mg{creat}

## 2024-03-03 ENCOUNTER — Other Ambulatory Visit: Payer: Self-pay

## 2024-03-03 ENCOUNTER — Ambulatory Visit: Admitting: Family Medicine

## 2024-03-03 ENCOUNTER — Other Ambulatory Visit (HOSPITAL_COMMUNITY)
Admission: RE | Admit: 2024-03-03 | Discharge: 2024-03-03 | Disposition: A | Discharge status: Home / Self Care | Source: Ambulatory Visit | Attending: Family Medicine | Admitting: Family Medicine

## 2024-03-03 ENCOUNTER — Encounter: Payer: Self-pay | Admitting: Advanced Practice Midwife

## 2024-03-03 ENCOUNTER — Encounter: Payer: Self-pay | Admitting: Family Medicine

## 2024-03-03 VITALS — BP 112/74 | HR 80 | Wt 140.5 lb

## 2024-03-03 DIAGNOSIS — F3181 Bipolar II disorder: Secondary | ICD-10-CM

## 2024-03-03 DIAGNOSIS — Z3483 Encounter for supervision of other normal pregnancy, third trimester: Secondary | ICD-10-CM | POA: Insufficient documentation

## 2024-03-03 DIAGNOSIS — Z3400 Encounter for supervision of normal first pregnancy, unspecified trimester: Secondary | ICD-10-CM

## 2024-03-03 DIAGNOSIS — O09893 Supervision of other high risk pregnancies, third trimester: Secondary | ICD-10-CM

## 2024-03-03 DIAGNOSIS — Z2839 Other underimmunization status: Secondary | ICD-10-CM

## 2024-03-03 DIAGNOSIS — F1120 Opioid dependence, uncomplicated: Secondary | ICD-10-CM

## 2024-03-03 DIAGNOSIS — Z3A36 36 weeks gestation of pregnancy: Secondary | ICD-10-CM | POA: Insufficient documentation

## 2024-03-03 DIAGNOSIS — O99320 Drug use complicating pregnancy, unspecified trimester: Secondary | ICD-10-CM

## 2024-03-03 DIAGNOSIS — F1920 Other psychoactive substance dependence, uncomplicated: Secondary | ICD-10-CM

## 2024-03-03 DIAGNOSIS — O365930 Maternal care for other known or suspected poor fetal growth, third trimester, not applicable or unspecified: Secondary | ICD-10-CM

## 2024-03-03 DIAGNOSIS — O99323 Drug use complicating pregnancy, third trimester: Secondary | ICD-10-CM

## 2024-03-03 DIAGNOSIS — O09899 Supervision of other high risk pregnancies, unspecified trimester: Secondary | ICD-10-CM

## 2024-03-03 MED ORDER — BUPRENORPHINE HCL-NALOXONE HCL 8-2 MG SL FILM: 1.0000 | ORAL_FILM | Freq: Two times a day (BID) | SUBLINGUAL | 0 refills | Status: DC

## 2024-03-03 NOTE — Progress Notes (Signed)
 Connie Fernandez is a 28 y.o. G1P0000 here today for OUD follow up.  Seen for initial consultation one week prior, offered and accepted transition from Kratom to Suboxone  Given detailed instructions on how to start and set up for close follow up today Overall plan to have patient continue routine prenatal care with [facility] office  Reports Stopping Kratom 03/02/24 @ 0200. Took Suboxone  8-2 film at 0430 4/29 this morning. Not having any withdrawal Sx currently. Had a problem with filling Rx at [facility] pharmacy so CNM called in 10 day RX to [facility] pharmacy on 4/27.   Pt very distraught that ToxAssure showed + Cocaine metabolite. Strongly denies taking anything other that Kratom.   Health Maintenance Due  Topic Date Due   Pneumococcal Vaccine 48-63 Years old (1 of 2 - PCV) Never done   DTaP/Tdap/Td (2 - Td or Tdap) 01/20/2021   COVID-19 Vaccine (1 - 2024-25 season) Never done    Past Medical History:  Diagnosis Date   Acne 02/19/2017   Acute cystitis with hematuria 07/25/2017   Anemia    Anxiety    Chronic pelvic pain in female    Had negative laparoscopy for endometriosis in 2017   Depression    Dysmenorrhea 02/19/2017   Gastritis 08/01/2021   Headache    Migraines   Heart palpitations    History of drug use 07/25/2017   History of drug use 07/25/2017   Opioid use disorder, mild, abuse (HCC) 01/19/2020   Pelvic floor dysfunction 10/05/2016   Primary dysmenorrhea 10/05/2016   Negative laparoscopy for endometriosis     Tobacco use disorder 10/28/2015    Past Surgical History:  Procedure Laterality Date   LAPAROSCOPY N/A 06/12/2016   Procedure: LAPAROSCOPY DIAGNOSTIC;  Surgeon: Ana Balling, MD;  Location: [facility];  Service: Gynecology;  Laterality: N/A;    The following portions of the patient's history were reviewed and updated as appropriate: allergies, current medications, past family history, past medical history, past social history, past surgical history and  problem list.   Health Maintenance:   Last pap:  Result Date Procedure Results Follow-ups  01/23/2022 Cytology - PAP( [facility]) Neisseria Gonorrhea: Negative Chlamydia: Negative Trichomonas: Negative Adequacy: Satisfactory for evaluation; transformation zone component PRESENT. Diagnosis: - Negative for Intraepithelial Lesions or Malignancy (NILM) Diagnosis: - Benign reactive/reparative changes Comment: Normal Reference Ranger Chlamydia - Negative Comment: Normal Reference Range Neisseria Gonorrhea - Negative Comment: Normal Reference Range Trichomonas - Negative   08/13/2019 Cytology - PAP Neisseria Gonorrhea: Negative Chlamydia: Negative Trichomonas: Negative Adequacy: Satisfactory for evaluation; transformation zone component PRESENT. Diagnosis: - Negative for intraepithelial lesion or malignancy (NILM) Comment: Normal Reference Ranger Chlamydia - Negative Comment: Normal Reference Range Neisseria Gonorrhea - Negative Comment: Normal Reference Range Trichomonas - Negative   12/23/2014 HM PAP SMEAR HM Pap smear: normal     Last mammogram:  N/a    Hepatitis serologies: Lab Results  Component Value Date   HEPCAB NON-REACTIVE 07/25/2017    Hep A Immunization: deferred to future visit  Hep B Immunization: deferred to future visit  Last LFTs: Lab Results  Component Value Date   ALT 9 09/16/2023   AST 15 09/16/2023   ALKPHOS 45 09/16/2023   BILITOT 0.4 09/16/2023     Review of Systems:  Pertinent items noted in HPI and remainder of comprehensive ROS otherwise negative.  Physical Exam:  BP 112/74   Pulse 80   Wt 140 lb 8 oz (63.7 kg)   LMP 06/18/2023 (Exact  Date)   BMI 25.70 kg/m  CONSTITUTIONAL: Well-developed, well-nourished female in no acute distress.  HEENT:  Normocephalic, atraumatic. External right and left ear normal. No scleral icterus.  NECK: Normal range of motion, supple, no masses noted on observation SKIN: No rash noted. Not diaphoretic. No  erythema. No pallor. MUSCULOSKELETAL: Normal range of motion. No edema noted. NEUROLOGIC: Alert and oriented to person, place, and time. Normal muscle tone coordination.  PSYCHIATRIC: Normal mood and affect. Normal behavior. Normal judgment and thought content. RESPIRATORY: Effort normal, no problems with respiration noted  Labs and Imaging I have reviewed the PDMP during this encounter.    Last UDS: Lab Results  Component Value Date   CREATIUR 177 02/25/2024    No results found for this or any previous visit (from the past week).        Assessment and Plan:   Problem List Items Addressed This Visit       Other   Supervision of normal first pregnancy, antepartum   Substance dependence during pregnancy (HCC)   Suboxone  maintenance treatment complicating pregnancy, antepartum, third trimester (HCC) - Primary - Pt doing well today with Suboxone  induction. Explained that it is OK to need additional dose beyond the 8 mg film that she took this morning. Explained that Split dose is often needed in pregnancy to avoid withdrawal Sx. - Discussed that there can be lacing of Kratom with other substances and that it's manufacturing is not regulated. This is even more reason to stop using it and use Suboxone  since it is a regulated, well-studied medication.    Relevant Medications   Buprenorphine  HCl-Naloxone  HCl (SUBOXONE ) 8-2 MG FILM   Other Relevant Orders   ToxAssure Flex 15, Ur   Rubella non-immune status, antepartum   IUGR (intrauterine growth restriction) affecting care of mother, third trimester   Bipolar 2 disorder (HCC)   Other Visit Diagnoses       [redacted] weeks gestation of pregnancy       Relevant Orders   Culture, beta strep (group b only)   Cervicovaginal ancillary only         Return in about 1 week (around 03/10/2024) for REACH .    Total face-to-face time with patient: 20 minutes.  Over 50% of encounter was spent on counseling and coordination of care.  Future  Appointments  Date Time Provider Department Center  03/04/2024  1:30 PM Granville Layer, MD [facility]  03/10/2024  3:35 PM Teena Feast, MD [facility]  03/11/2024  2:50 PM Granville Layer, MD [facility]  03/18/2024  3:30 PM Thurmon Florida, Kathrine Paris, MD [facility]  03/25/2024  3:50 PM Granville Layer, MD [facility]    [place]  Felipe Horton, [facility] for [facility]

## 2024-03-03 NOTE — Patient Instructions (Signed)
 Instruction for starting buprenorphine -naloxone  (Suboxone ) at home  You should not mix buprenorphine -naloxone  with other drugs especially large amounts of alcohol or benzodiazepines (Valium, Klonopin, Xanax, Ativan ). If you have taken any of these medication, please tell your healthcare team and do not take buprenorphine -naloxone .   You must wait until you are feeling signs of withdrawal from opiates (heroin, pain pills, fentanyl ) before you take buprenorphine -naloxone .  If you do not wait long enough the medication will make you sicker.  If you do take it too soon and get sicker then wait until later when you feel signs of withdrawal listed below and then try again.   Signs that you are withdrawing: Anxiety, restlessness, can't sit still Aches Nausea or sick to your stomach Diarrhea Goose-bumps Racing heart  Frequent yawning  You should have ALL of these symptoms before you start taking your first dose of buprenorphine -naloxone . If you are not sure call your healthcare team.    When it's time to take your first dose Take 2 films or tablets depending on what you have discussed with your doctor Make sure your mouth is empty of everything (no candy/gum/etc) Sit or stand, but do not lie down Swallow a sip of water to wet your mouth  Put the tablet(s) or film(s) under your tongue. Do not suck or swallow it. It must stay there until it is completely dissolved. Try to not even swallow your spit during this time. Anything that you swallow will not make you feel better.   In one hour: You should start feeling a little better. You can take the another pill or film the same way you took the first one if you do not feel total relief of your withdrawal symptoms.   In 2 hours: if you are still feeling symptoms of withdrawal listed above you can take another full pill or film. You can repeat this if needed until you take a total dose of up to 24 mg of Suboxone . You may need less than this to control your  symptoms.  You should adjust your daily dose so that you are are not experiencing withdrawal symptoms or cravings.   The next day:  In the morning you can take the same amount you took yesterday but spread out evenly over the day.    If you have any questions or concerns at any time call your healthcare team.  Office Number:  [phone]

## 2024-03-04 ENCOUNTER — Encounter: Payer: Medicaid Other | Admitting: Family Medicine

## 2024-03-04 ENCOUNTER — Encounter: Payer: Self-pay | Admitting: Obstetrics & Gynecology

## 2024-03-05 LAB — CERVICOVAGINAL ANCILLARY ONLY
Chlamydia: NEGATIVE
Comment: NEGATIVE
Comment: NORMAL
Neisseria Gonorrhea: NEGATIVE

## 2024-03-06 ENCOUNTER — Ambulatory Visit (INDEPENDENT_AMBULATORY_CARE_PROVIDER_SITE_OTHER): Admitting: Family Medicine

## 2024-03-06 VITALS — BP 111/66 | HR 78 | Wt 140.0 lb

## 2024-03-06 DIAGNOSIS — F1920 Other psychoactive substance dependence, uncomplicated: Secondary | ICD-10-CM

## 2024-03-06 DIAGNOSIS — O99320 Drug use complicating pregnancy, unspecified trimester: Secondary | ICD-10-CM

## 2024-03-06 DIAGNOSIS — Z3400 Encounter for supervision of normal first pregnancy, unspecified trimester: Secondary | ICD-10-CM

## 2024-03-06 DIAGNOSIS — O365930 Maternal care for other known or suspected poor fetal growth, third trimester, not applicable or unspecified: Secondary | ICD-10-CM

## 2024-03-06 LAB — TOXASSURE FLEX 15, UR
6-ACETYLMORPHINE IA: NEGATIVE ng/mL
7-aminoclonazepam: NOT DETECTED ng/mg{creat}
AMPHETAMINES IA: NEGATIVE ng/mL
Alpha-hydroxyalprazolam: NOT DETECTED ng/mg{creat}
Alpha-hydroxymidazolam: NOT DETECTED ng/mg{creat}
Alpha-hydroxytriazolam: NOT DETECTED ng/mg{creat}
Alprazolam: NOT DETECTED ng/mg{creat}
BARBITURATES IA: NEGATIVE ng/mL
BUPRENORPHINE: POSITIVE
Benzodiazepines: NEGATIVE
Buprenorphine: 28 ng/mg{creat}
CANNABINOIDS IA: NEGATIVE ng/mL
Clonazepam: NOT DETECTED ng/mg{creat}
Creatinine: 220 mg/dL
Desalkylflurazepam: NOT DETECTED ng/mg{creat}
Desmethyldiazepam: NOT DETECTED ng/mg{creat}
Desmethylflunitrazepam: NOT DETECTED ng/mg{creat}
Diazepam: NOT DETECTED ng/mg{creat}
ETHYL ALCOHOL Enzymatic: NEGATIVE g/dL
FENTANYL: NEGATIVE
Fentanyl: NOT DETECTED ng/mg{creat}
Flunitrazepam: NOT DETECTED ng/mg{creat}
Lorazepam: NOT DETECTED ng/mg{creat}
METHADONE IA: NEGATIVE ng/mL
METHADONE MTB IA: NEGATIVE ng/mL
Midazolam: NOT DETECTED ng/mg{creat}
Norbuprenorphine: 120 ng/mg{creat}
Norfentanyl: NOT DETECTED ng/mg{creat}
OPIATE CLASS IA: NEGATIVE ng/mL
OXYCODONE CLASS IA: NEGATIVE ng/mL
Oxazepam: NOT DETECTED ng/mg{creat}
PHENCYCLIDINE IA: NEGATIVE ng/mL
TAPENTADOL, IA: NEGATIVE ng/mL
TRAMADOL IA: NEGATIVE ng/mL
Temazepam: NOT DETECTED ng/mg{creat}

## 2024-03-06 LAB — COCAINE AND MTB, MS, UR RFX
Benzoylecgonine: 224 ng/mg{creat}
Cocaethylene: NOT DETECTED ng/mg{creat}
Cocaine Confirmation: POSITIVE
Cocaine: NOT DETECTED ng/mg{creat}

## 2024-03-06 NOTE — Progress Notes (Signed)
   PRENATAL VISIT NOTE  Subjective:  Connie Fernandez is a 28 y.o. G1P0000 at [redacted]w[redacted]d being seen today for ongoing prenatal care.  She is currently monitored for the following issues for this high-risk pregnancy and has Anxiety; Major depressive disorder, recurrent episode (HCC); Bipolar 2 disorder (HCC); Substance dependence during pregnancy (HCC); Supervision of normal first pregnancy, antepartum; Rubella non-immune status, antepartum; Suboxone  maintenance treatment complicating pregnancy, antepartum, third trimester (HCC); and IUGR (intrauterine growth restriction) affecting care of mother, third trimester on their problem list.  Patient reports  pelvic pressure .  Contractions: Irregular. Vag. Bleeding: None.  Movement: Present. Denies leaking of fluid.   The following portions of the patient's history were reviewed and updated as appropriate: allergies, current medications, past family history, past medical history, past social history, past surgical history and problem list.   Objective:   Vitals:   03/06/24 1126  BP: 111/66  Pulse: 78  Weight: 140 lb (63.5 kg)    Fetal Status: Fetal Heart Rate (bpm): 136   Movement: Present     General:  Alert, oriented and cooperative. Patient is in no acute distress.  Skin: Skin is warm and dry. No rash noted.   Cardiovascular: Normal heart rate noted  Respiratory: Normal respiratory effort, no problems with respiration noted  Abdomen: Soft, gravid, appropriate for gestational age.  Pain/Pressure: Present     Pelvic: Cervical exam deferred        Extremities: Normal range of motion.  Edema: None  Mental Status: Normal mood and affect. Normal behavior. Normal judgment and thought content.   Assessment and Plan:  Pregnancy: G1P0000 at [redacted]w[redacted]d 1. Supervision of normal first pregnancy, antepartum (Primary) Up to date Vaginal testing done at [facility] on 4/30 FH low but known FGR 5th% IOL scheduled  2. Substance dependence during pregnancy  (HCC) On suboxone   Seeing REACH next week Started MAT induction and doing well  3. IUGR (intrauterine growth restriction) affecting care of mother, third trimester Discussed IOL at 55 week due to FGR with normal dopplers per MFM Reviewed this can change if testing next week is changed Is not scheduled for MFM BPP with dopplers which was intended per last note Front desk to help schedule   Preterm labor symptoms and general obstetric precautions including but not limited to vaginal bleeding, contractions, leaking of fluid and fetal movement were reviewed in detail with the patient. Please refer to After Visit Summary for other counseling recommendations.   Return in about 1 week (around 03/13/2024) for Routine prenatal care.  Future Appointments  Date Time Provider Department Center  03/10/2024  3:35 PM Teena Feast, MD [facility]  03/11/2024  2:50 PM Granville Layer, MD [facility]  03/17/2024 12:00 AM [facility] SCHED ROOM [facility] None    Abner Ables, MD

## 2024-03-06 NOTE — Progress Notes (Signed)
 ROB: Group b done 03/04/24 GC/C done 03/03/24 Wants Cervix checked

## 2024-03-07 LAB — CULTURE, BETA STREP (GROUP B ONLY): Strep Gp B Culture: POSITIVE — AB

## 2024-03-09 ENCOUNTER — Encounter: Payer: Self-pay | Admitting: Family Medicine

## 2024-03-09 DIAGNOSIS — O99820 Streptococcus B carrier state complicating pregnancy: Secondary | ICD-10-CM | POA: Insufficient documentation

## 2024-03-10 ENCOUNTER — Ambulatory Visit: Admitting: Family Medicine

## 2024-03-10 ENCOUNTER — Other Ambulatory Visit: Payer: Self-pay

## 2024-03-10 VITALS — BP 122/73 | HR 80 | Wt 141.2 lb

## 2024-03-10 DIAGNOSIS — O09893 Supervision of other high risk pregnancies, third trimester: Secondary | ICD-10-CM | POA: Diagnosis not present

## 2024-03-10 DIAGNOSIS — Z3A37 37 weeks gestation of pregnancy: Secondary | ICD-10-CM | POA: Diagnosis not present

## 2024-03-10 DIAGNOSIS — F339 Major depressive disorder, recurrent, unspecified: Secondary | ICD-10-CM

## 2024-03-10 DIAGNOSIS — O365930 Maternal care for other known or suspected poor fetal growth, third trimester, not applicable or unspecified: Secondary | ICD-10-CM

## 2024-03-10 DIAGNOSIS — O99820 Streptococcus B carrier state complicating pregnancy: Secondary | ICD-10-CM

## 2024-03-10 DIAGNOSIS — F1920 Other psychoactive substance dependence, uncomplicated: Secondary | ICD-10-CM

## 2024-03-10 DIAGNOSIS — O99323 Drug use complicating pregnancy, third trimester: Secondary | ICD-10-CM

## 2024-03-10 DIAGNOSIS — Z2839 Other underimmunization status: Secondary | ICD-10-CM

## 2024-03-10 DIAGNOSIS — F1120 Opioid dependence, uncomplicated: Secondary | ICD-10-CM | POA: Diagnosis not present

## 2024-03-10 DIAGNOSIS — Z3400 Encounter for supervision of normal first pregnancy, unspecified trimester: Secondary | ICD-10-CM

## 2024-03-10 MED ORDER — BUPRENORPHINE HCL-NALOXONE HCL 8-2 MG SL FILM: 1.0000 | ORAL_FILM | Freq: Two times a day (BID) | SUBLINGUAL | 0 refills | Status: DC

## 2024-03-10 NOTE — Progress Notes (Signed)
   Subjective:   Connie Fernandez is a 28 y.o. G1P0000 here today for ongoing substance exposed newborn consult.   Health Maintenance Due  Topic Date Due   Pneumococcal Vaccine 85-46 Years old (1 of 2 - PCV) Never done   DTaP/Tdap/Td (2 - Td or Tdap) 01/20/2021   COVID-19 Vaccine (1 - 2024-25 season) Never done    Past Medical History:  Diagnosis Date   Acne 02/19/2017   Acute cystitis with hematuria 07/25/2017   Anemia    Anxiety    Chronic pelvic pain in female    Had negative laparoscopy for endometriosis in 2017   Depression    Dysmenorrhea 02/19/2017   Gastritis 08/01/2021   Headache    Migraines   Heart palpitations    History of drug use 07/25/2017   History of drug use 07/25/2017   Opioid use disorder, mild, abuse (HCC) 01/19/2020   Pelvic floor dysfunction 10/05/2016   Primary dysmenorrhea 10/05/2016   Negative laparoscopy for endometriosis     Tobacco use disorder 10/28/2015    Past Surgical History:  Procedure Laterality Date   LAPAROSCOPY N/A 06/12/2016   Procedure: LAPAROSCOPY DIAGNOSTIC;  Surgeon: Ana Balling, MD;  Location: [facility];  Service: Gynecology;  Laterality: N/A;    The following portions of the patient's history were reviewed and updated as appropriate: allergies, current medications, past family history, past medical history, past social history, past surgical history and problem list.     Objective:   Connie Fernandez is well appearing in no acute distress. She has linear thinking and clear communication.     Assessment and Plan:  Met with Connie Fernandez today at [facility] for ongoing substance exposed newborn consult. We discussed her ongoing prenatal care and medication management. She stated that the current suboxone  dosing has captured her symptoms well and she has not felt any breakthrough cravings. We discussed the expectations following delivery and observation period following delivery. Connie Fernandez did not voice any questions or concerns at this  time.  Encouraged Tomorrow to contact NAS consult phone in between appointments with any further questions or concerns.     Problem List Items Addressed This Visit       Other   Major depressive disorder, recurrent episode (HCC)   Substance dependence during pregnancy (HCC)   Relevant Orders   ToxAssure Flex 15, Ur   Supervision of normal first pregnancy, antepartum - Primary   Rubella non-immune status, antepartum   Suboxone  maintenance treatment complicating pregnancy, antepartum, third trimester (HCC)   Relevant Orders   ToxAssure Flex 15, Ur   IUGR (intrauterine growth restriction) affecting care of mother, third trimester   GBS (group B Streptococcus carrier), +RV culture, currently pregnant   Other Visit Diagnoses       [redacted] weeks gestation of pregnancy           Routine preventative health maintenance measures emphasized. Please refer to After Visit Summary for other counseling recommendations.   Return in about 1 week (around 03/17/2024) for REACH ROB.    Total face-to-face time with patient: 20 minutes.  Over 50% of encounter was spent on counseling and coordination of care.   Jayson Michael, NNP-BC Neonatal Nurse Practitioner Substance Exposed Newborn Consult at the [facility] [phone]

## 2024-03-10 NOTE — Progress Notes (Signed)
 Subjective:  Connie Fernandez is a 28 y.o. G1P0000 at [redacted]w[redacted]d being seen today for ongoing prenatal care.  She is currently monitored for the following issues for this high-risk pregnancy and has Anxiety; Major depressive disorder, recurrent episode (HCC); Bipolar 2 disorder (HCC); Substance dependence during pregnancy (HCC); Supervision of normal first pregnancy, antepartum; Rubella non-immune status, antepartum; Suboxone  maintenance treatment complicating pregnancy, antepartum, third trimester (HCC); IUGR (intrauterine growth restriction) affecting care of mother, third trimester; and GBS (group B Streptococcus carrier), +RV culture, currently pregnant on their problem list.  Patient reports occasional contractions.  Contractions: Regular. Vag. Bleeding: None.  Movement: Present. Denies leaking of fluid.   Doing very well on Suboxone  8-2 BID. No longer feeling sleepy. No Withdrawal Sx.   The following portions of the patient's history were reviewed and updated as appropriate: allergies, current medications, past family history, past medical history, past social history, past surgical history and problem list. Problem list updated.  Objective:   Vitals:   03/10/24 1607  BP: 122/73  Pulse: 80  Weight: 141 lb 3.2 oz (64 kg)    Fetal Status: Fetal Heart Rate (bpm): 156   Movement: Present  Presentation: Vertex  General:  Alert, oriented and cooperative. Patient is in no acute distress.  Skin: Skin is warm and dry. No rash noted.   Cardiovascular: Normal heart rate noted  Respiratory: Normal respiratory effort, no problems with respiration noted  Abdomen: Soft, gravid, appropriate for gestational age. Pain/Pressure: Present     Pelvic: Vag. Bleeding: None     Cervical exam performed Dilation: 1.5 Effacement (%): 60 Station: -3  Extremities: Normal range of motion.  Edema: Trace  Mental Status: Normal mood and affect. Normal behavior. Normal judgment and thought content.   Urinalysis:       PDMP reviewed during this encounter.   Last UDS: Lab Results  Component Value Date   CREATIUR 220 03/03/2024     Assessment and Plan:  Pregnancy: G1P0000 at [redacted]w[redacted]d  1. Supervision of normal first pregnancy, antepartum (Primary)  2. Substance dependence during pregnancy (HCC) - ToxAssure Flex 15, Ur  3. Suboxone  maintenance treatment complicating pregnancy, antepartum, third trimester (HCC) - ToxAssure Flex 15, Ur - Buprenorphine  HCl-Naloxone  HCl (SUBOXONE ) 8-2 MG FILM; Place 1 Film under the tongue in the morning and at bedtime.  Dispense: 60 Film; Refill: 0 - F/U Virtual visit in 2 weeks   4. Rubella non-immune status, antepartum - MMR PP 5. Episode of recurrent major depressive disorder, unspecified depression episode severity (HCC)  6. IUGR (intrauterine growth restriction) affecting care of mother, third trimester - MFM rec 38 week IOL. Scheduled 5/13  7. GBS (group B Streptococcus carrier), +RV culture, currently pregnant - Ancef in Labor (Tolerates Keflex  and Rocephin )  8. [redacted] weeks gestation of pregnancy   Term labor symptoms and general obstetric precautions including but not limited to vaginal bleeding, contractions, leaking of fluid and fetal movement were reviewed in detail with the patient. Please refer to After Visit Summary for other counseling recommendations.   Return in about 2 weeks (around 03/24/2024) for REACH ROB Virtual .   Future Appointments  Date Time Provider Department Center  03/11/2024  1:00 PM [facility] PROVIDER 1 [facility]  03/11/2024  1:30 PM [facility] US6 [facility]  03/11/2024  3:30 PM Granville Layer, MD [facility]  03/17/2024 12:00 AM [facility] SCHED ROOM [facility] None    Total face-to-face time with patient: 10 minutes.  Over 50% of encounter was spent on counseling and  coordination of care.   Aniqua Briere  Benard Brackett 03/10/2024 4:49 PM Center for [employer], [facility]

## 2024-03-10 NOTE — Progress Notes (Signed)
 error

## 2024-03-11 ENCOUNTER — Encounter: Admitting: Family Medicine

## 2024-03-11 ENCOUNTER — Ambulatory Visit: Attending: Obstetrics and Gynecology | Admitting: Obstetrics

## 2024-03-11 ENCOUNTER — Ambulatory Visit (HOSPITAL_BASED_OUTPATIENT_CLINIC_OR_DEPARTMENT_OTHER)

## 2024-03-11 VITALS — BP 122/61 | HR 65

## 2024-03-11 DIAGNOSIS — F1120 Opioid dependence, uncomplicated: Secondary | ICD-10-CM | POA: Insufficient documentation

## 2024-03-11 DIAGNOSIS — Z2839 Other underimmunization status: Secondary | ICD-10-CM

## 2024-03-11 DIAGNOSIS — O365930 Maternal care for other known or suspected poor fetal growth, third trimester, not applicable or unspecified: Secondary | ICD-10-CM | POA: Insufficient documentation

## 2024-03-11 DIAGNOSIS — F1920 Other psychoactive substance dependence, uncomplicated: Secondary | ICD-10-CM

## 2024-03-11 DIAGNOSIS — O99323 Drug use complicating pregnancy, third trimester: Secondary | ICD-10-CM | POA: Diagnosis not present

## 2024-03-11 DIAGNOSIS — O358XX0 Maternal care for other (suspected) fetal abnormality and damage, not applicable or unspecified: Secondary | ICD-10-CM | POA: Insufficient documentation

## 2024-03-11 DIAGNOSIS — F1190 Opioid use, unspecified, uncomplicated: Secondary | ICD-10-CM | POA: Diagnosis not present

## 2024-03-11 DIAGNOSIS — Z362 Encounter for other antenatal screening follow-up: Secondary | ICD-10-CM | POA: Insufficient documentation

## 2024-03-11 DIAGNOSIS — Z3A37 37 weeks gestation of pregnancy: Secondary | ICD-10-CM | POA: Diagnosis not present

## 2024-03-11 DIAGNOSIS — O99320 Drug use complicating pregnancy, unspecified trimester: Secondary | ICD-10-CM

## 2024-03-11 NOTE — Progress Notes (Signed)
 MFM Consult Note  Connie Fernandez is currently at 37 weeks and 2 days.  She was seen due to maternal treatment with Suboxone  and IUGR.    She reports feeling fetal movements throughout the day.    A biophysical profile performed today was 8/8.  There was normal amniotic fluid noted with a total AFI of 11.21 cm.    The fetus was in the vertex presentation.  Doppler studies of the umbilical arteries showed a normal S/D ratio of 2.14 .  There were no signs of absent or reversed end-diastolic flow.    Due to IUGR, she is already scheduled for an induction of labor on Mar 17, 2024 (in 6 days).    No further exams were scheduled in our office.  The patient stated that all of her questions were answered.    A total of 10 minutes was spent counseling and coordinating the care for this patient.  Greater than 50% of the time was spent in direct face-to-face contact.

## 2024-03-14 LAB — TOXASSURE FLEX 15, UR
6-ACETYLMORPHINE IA: NEGATIVE ng/mL
7-aminoclonazepam: NOT DETECTED ng/mg{creat}
AMPHETAMINES IA: NEGATIVE ng/mL
Alpha-hydroxyalprazolam: NOT DETECTED ng/mg{creat}
Alpha-hydroxymidazolam: NOT DETECTED ng/mg{creat}
Alpha-hydroxytriazolam: NOT DETECTED ng/mg{creat}
Alprazolam: NOT DETECTED ng/mg{creat}
BARBITURATES IA: NEGATIVE ng/mL
BUPRENORPHINE: POSITIVE
Benzodiazepines: NEGATIVE
Buprenorphine: 357 ng/mg{creat}
CANNABINOIDS IA: NEGATIVE ng/mL
COCAINE METABOLITE IA: NEGATIVE ng/mL
Clonazepam: NOT DETECTED ng/mg{creat}
Creatinine: 91 mg/dL (ref >=20)
Desalkylflurazepam: NOT DETECTED ng/mg{creat}
Desmethyldiazepam: NOT DETECTED ng/mg{creat}
Desmethylflunitrazepam: NOT DETECTED ng/mg{creat}
Diazepam: NOT DETECTED ng/mg{creat}
ETHYL ALCOHOL Enzymatic: NEGATIVE g/dL
FENTANYL: NEGATIVE
Fentanyl: NOT DETECTED ng/mg{creat}
Flunitrazepam: NOT DETECTED ng/mg{creat}
Lorazepam: NOT DETECTED ng/mg{creat}
METHADONE IA: NEGATIVE ng/mL
METHADONE MTB IA: NEGATIVE ng/mL
Midazolam: NOT DETECTED ng/mg{creat}
Norbuprenorphine: >1099 ng/mg{creat}
Norfentanyl: NOT DETECTED ng/mg{creat}
OXYCODONE CLASS IA: NEGATIVE ng/mL
Oxazepam: NOT DETECTED ng/mg{creat}
PHENCYCLIDINE IA: NEGATIVE ng/mL
TAPENTADOL, IA: NEGATIVE ng/mL
TRAMADOL IA: NEGATIVE ng/mL
Temazepam: NOT DETECTED ng/mg{creat}

## 2024-03-14 LAB — OPIATE CLASS, MS, UR RFX
Codeine: 155 ng/mg{creat}
Dihydrocodeine: NOT DETECTED ng/mg{creat}
Hydrocodone: NOT DETECTED ng/mg{creat}
Hydromorphone: NOT DETECTED ng/mg{creat}
Morphine: NOT DETECTED ng/mg{creat}
Norcodeine: NOT DETECTED ng/mg{creat}
Norhydrocodone: NOT DETECTED ng/mg{creat}
Normorphine: NOT DETECTED ng/mg{creat}
Opiate Class Confirmation: POSITIVE

## 2024-03-16 ENCOUNTER — Inpatient Hospital Stay (HOSPITAL_COMMUNITY)
Admission: AD | Admit: 2024-03-16 | Discharge: 2024-03-16 | Disposition: A | Discharge status: Home / Self Care | Source: Home / Self Care | Attending: Family Medicine | Admitting: Family Medicine

## 2024-03-16 ENCOUNTER — Encounter (HOSPITAL_COMMUNITY): Payer: Self-pay | Admitting: Family Medicine

## 2024-03-16 DIAGNOSIS — Z3493 Encounter for supervision of normal pregnancy, unspecified, third trimester: Secondary | ICD-10-CM

## 2024-03-16 DIAGNOSIS — Z3689 Encounter for other specified antenatal screening: Secondary | ICD-10-CM | POA: Diagnosis not present

## 2024-03-16 DIAGNOSIS — Z3A38 38 weeks gestation of pregnancy: Secondary | ICD-10-CM

## 2024-03-16 NOTE — MAU Note (Addendum)
 Connie Fernandez is a 28 y.o. at [redacted]w[redacted]d here in MAU reporting: "my family woke me up and wanted me to get the baby checked out, I have an induction tonight" NO c/o SROM, vaginal bleeding, contractions, decreased FM, HA, chest pain, or visual disturbances. EFM explained and applied to soft non tender abd. Physical begun.   LMP: na Onset of complaint: this morning Pain score: 0/10 Vitals:   03/16/24 1130  BP: 121/71  Pulse: 67  Resp: 20  Temp: 98.1 F (36.7 C)  SpO2: 100%     FHT: bypass triage to room   Lab orders placed from triage: na

## 2024-03-16 NOTE — Discharge Instructions (Signed)
 Ms. Hartford,  We look forward to seeing you for your induction later today.  Thank you for trusting us  to care for you, Abraham Hoffmann, Midwife

## 2024-03-16 NOTE — MAU Provider Note (Signed)
 None     S Ms. Javionna Burch is a 28 y.o. G1P0000 pregnant female at [redacted]w[redacted]d who presents to MAU today with complaint of "family woke her up and asked her to come in to check on baby". She denies regular contractions, LOF or VB. Endorses regular and vigorous fetal movement. She has an IOL scheduled for tonight per MFM recommendations and patient report.   Receives care at [facility]. Prenatal records reviewed.  Pertinent items noted in HPI and remainder of comprehensive ROS otherwise negative.   O BP 121/71 (BP Location: Right Arm)   Pulse 67   Temp 98.1 F (36.7 C) (Oral)   Resp 20   LMP 06/18/2023 (Exact Date)   SpO2 100%  Physical Exam Vitals and nursing note reviewed.  Constitutional:      Appearance: Normal appearance. She is normal weight.  HENT:     Head: Normocephalic.  Cardiovascular:     Rate and Rhythm: Normal rate and regular rhythm.     Pulses: Normal pulses.     Heart sounds: Normal heart sounds.  Pulmonary:     Effort: Pulmonary effort is normal.     Breath sounds: Normal breath sounds.  Skin:    General: Skin is warm and dry.     Capillary Refill: Capillary refill takes less than 2 seconds.  Neurological:     General: No focal deficit present.     Mental Status: She is alert and oriented to person, place, and time.  Psychiatric:        Mood and Affect: Mood normal.        Behavior: Behavior normal.        Thought Content: Thought content normal.        Judgment: Judgment normal.      MDM: NST reactive and patient without complaint.  MAU Course:  A [redacted] weeks gestation of pregnancy  NST (non-stress test) reactive  Presence of fetal heart sounds in third trimester  Movement of fetus present during pregnancy in third trimester  Medical screening exam complete  P Discharge from MAU in stable condition with routine precautions Return to L&D for IOL when called.   Allergies as of 03/16/2024       Reactions   Latex Itching   Penicillins Rash    Able to take Keflex  and Rocephin  Has patient had a PCN reaction causing immediate rash, facial/tongue/throat swelling, SOB or lightheadedness with hypotension: Dizziness and shaky. Has patient had a PCN reaction causing severe rash involving mucus membranes or skin necrosis: NO Has patient had a PCN reaction that required hospitalization: NO Has patient had a PCN reaction occurring within the last 10 years: NO If all of the above answers are "NO", then may proceed with Cephalosporin use.        Medication List     TAKE these medications    aspirin  EC 81 MG tablet Take 1 tablet (81 mg total) by mouth at bedtime. Start taking when you are [redacted] weeks pregnant for rest of pregnancy for prevention of preeclampsia   Buprenorphine  HCl-Naloxone  HCl 8-2 MG Film Commonly known as: Suboxone  Place 1 Film under the tongue in the morning and at bedtime.   PRENATAL GUMMIES PO Take by mouth.        Raford Bunk, MSN, CNM 03/16/2024 12:17 PM  Certified Nurse Midwife, [facility]

## 2024-03-17 ENCOUNTER — Ambulatory Visit: Payer: Self-pay | Admitting: Advanced Practice Midwife

## 2024-03-17 ENCOUNTER — Inpatient Hospital Stay (HOSPITAL_COMMUNITY)
Admission: RE | Admit: 2024-03-17 | Discharge: 2024-03-20 | DRG: 806 | Disposition: A | Discharge status: Home / Self Care | Attending: Obstetrics and Gynecology | Admitting: Obstetrics and Gynecology

## 2024-03-17 ENCOUNTER — Encounter (HOSPITAL_COMMUNITY): Payer: Self-pay | Admitting: Family Medicine

## 2024-03-17 ENCOUNTER — Other Ambulatory Visit: Payer: Self-pay

## 2024-03-17 ENCOUNTER — Inpatient Hospital Stay (HOSPITAL_COMMUNITY)

## 2024-03-17 DIAGNOSIS — F1120 Opioid dependence, uncomplicated: Secondary | ICD-10-CM | POA: Diagnosis present

## 2024-03-17 DIAGNOSIS — F17210 Nicotine dependence, cigarettes, uncomplicated: Secondary | ICD-10-CM | POA: Diagnosis present

## 2024-03-17 DIAGNOSIS — O99320 Drug use complicating pregnancy, unspecified trimester: Secondary | ICD-10-CM | POA: Diagnosis present

## 2024-03-17 DIAGNOSIS — O99334 Smoking (tobacco) complicating childbirth: Secondary | ICD-10-CM | POA: Diagnosis present

## 2024-03-17 DIAGNOSIS — O9902 Anemia complicating childbirth: Secondary | ICD-10-CM | POA: Diagnosis present

## 2024-03-17 DIAGNOSIS — Z3A38 38 weeks gestation of pregnancy: Secondary | ICD-10-CM

## 2024-03-17 DIAGNOSIS — Z23 Encounter for immunization: Secondary | ICD-10-CM

## 2024-03-17 DIAGNOSIS — O99824 Streptococcus B carrier state complicating childbirth: Secondary | ICD-10-CM | POA: Diagnosis present

## 2024-03-17 DIAGNOSIS — O365930 Maternal care for other known or suspected poor fetal growth, third trimester, not applicable or unspecified: Secondary | ICD-10-CM | POA: Diagnosis present

## 2024-03-17 DIAGNOSIS — Z91040 Latex allergy status: Secondary | ICD-10-CM | POA: Diagnosis not present

## 2024-03-17 DIAGNOSIS — Z833 Family history of diabetes mellitus: Secondary | ICD-10-CM | POA: Diagnosis not present

## 2024-03-17 DIAGNOSIS — Z880 Allergy status to penicillin: Secondary | ICD-10-CM

## 2024-03-17 DIAGNOSIS — O99820 Streptococcus B carrier state complicating pregnancy: Secondary | ICD-10-CM | POA: Diagnosis not present

## 2024-03-17 DIAGNOSIS — O365990 Maternal care for other known or suspected poor fetal growth, unspecified trimester, not applicable or unspecified: Principal | ICD-10-CM | POA: Diagnosis present

## 2024-03-17 DIAGNOSIS — Z8249 Family history of ischemic heart disease and other diseases of the circulatory system: Secondary | ICD-10-CM | POA: Diagnosis not present

## 2024-03-17 DIAGNOSIS — Z8759 Personal history of other complications of pregnancy, childbirth and the puerperium: Secondary | ICD-10-CM | POA: Diagnosis present

## 2024-03-17 DIAGNOSIS — O99344 Other mental disorders complicating childbirth: Secondary | ICD-10-CM | POA: Diagnosis not present

## 2024-03-17 DIAGNOSIS — O99324 Drug use complicating childbirth: Secondary | ICD-10-CM | POA: Diagnosis present

## 2024-03-17 LAB — COMPREHENSIVE METABOLIC PANEL WITH GFR
ALT: 13 U/L (ref 0–44)
AST: 29 U/L (ref 15–41)
Albumin: 2.9 g/dL — ABNORMAL LOW (ref 3.5–5.0)
Alkaline Phosphatase: 98 U/L (ref 38–126)
Anion gap: 7 (ref 5–15)
BUN: 6 mg/dL (ref 6–20)
CO2: 22 mmol/L (ref 22–32)
Calcium: 8.7 mg/dL — ABNORMAL LOW (ref 8.9–10.3)
Chloride: 104 mmol/L (ref 98–111)
Creatinine, Ser: 0.62 mg/dL (ref 0.44–1.00)
GFR, Estimated: >60 mL/min (ref >60)
Glucose, Bld: 77 mg/dL (ref 70–99)
Potassium: 4.4 mmol/L (ref 3.5–5.1)
Sodium: 133 mmol/L — ABNORMAL LOW (ref 135–145)
Total Bilirubin: 0.6 mg/dL (ref 0.0–1.2)
Total Protein: 5.6 g/dL — ABNORMAL LOW (ref 6.5–8.1)

## 2024-03-17 LAB — TYPE AND SCREEN
ABO/RH(D): B POS
Antibody Screen: NEGATIVE

## 2024-03-17 LAB — CBC
HCT: 31 % — ABNORMAL LOW (ref 36.0–46.0)
Hemoglobin: 9.6 g/dL — ABNORMAL LOW (ref 12.0–15.0)
MCH: 26.6 pg (ref 26.0–34.0)
MCHC: 31 g/dL (ref 30.0–36.0)
MCV: 85.9 fL (ref 80.0–100.0)
Platelets: 225 10*3/uL (ref 150–400)
RBC: 3.61 MIL/uL — ABNORMAL LOW (ref 3.87–5.11)
RDW: 13.1 % (ref 11.5–15.5)
WBC: 7.2 10*3/uL (ref 4.0–10.5)
nRBC: 0 % (ref 0.0–0.2)

## 2024-03-17 LAB — RPR: RPR Ser Ql: NONREACTIVE

## 2024-03-17 MED ORDER — LACTATED RINGERS IV SOLN
500.0000 mL | INTRAVENOUS | Status: AC | PRN
  Administered 2024-03-17 (×2): 500 mL via INTRAVENOUS
  Administered 2024-03-17: 1000 mL via INTRAVENOUS
  Administered 2024-03-17: 500 mL via INTRAVENOUS

## 2024-03-17 MED ORDER — OXYTOCIN-SODIUM CHLORIDE 30-0.9 UT/500ML-% IV SOLN
2.5000 [IU]/h | INTRAVENOUS | Status: DC
Start: 2024-03-17 — End: 2024-03-18

## 2024-03-17 MED ORDER — OXYTOCIN-SODIUM CHLORIDE 30-0.9 UT/500ML-% IV SOLN
1.0000 m[IU]/min | INTRAVENOUS | Status: DC
Start: 2024-03-17 — End: 2024-03-18
  Administered 2024-03-17 – 2024-03-18 (×4): 2 m[IU]/min via INTRAVENOUS
  Filled 2024-03-17: qty 500

## 2024-03-17 MED ORDER — BUPRENORPHINE HCL-NALOXONE HCL 2-0.5 MG SL SUBL
1.0000 | SUBLINGUAL_TABLET | SUBLINGUAL | Status: DC | PRN
  Administered 2024-03-17 – 2024-03-20 (×15): 1 via SUBLINGUAL
  Filled 2024-03-17 (×17): qty 1

## 2024-03-17 MED ORDER — OXYCODONE-ACETAMINOPHEN 5-325 MG PO TABS
2.0000 | ORAL_TABLET | ORAL | Status: DC | PRN
Start: 2024-03-17 — End: 2024-03-18

## 2024-03-17 MED ORDER — LIDOCAINE HCL (PF) 1 % IJ SOLN: 30.0000 mL | INTRAMUSCULAR | Status: DC | PRN

## 2024-03-17 MED ORDER — ACETAMINOPHEN 325 MG PO TABS: 650.0000 mg | ORAL_TABLET | ORAL | Status: DC | PRN

## 2024-03-17 MED ORDER — MISOPROSTOL 50MCG HALF TABLET
50.0000 ug | ORAL_TABLET | ORAL | Status: DC | PRN
  Administered 2024-03-17: 50 ug via ORAL
  Filled 2024-03-17 (×2): qty 1

## 2024-03-17 MED ORDER — TERBUTALINE SULFATE 1 MG/ML IJ SOLN
0.2500 mg | Freq: Once | INTRAMUSCULAR | Status: AC | PRN
  Administered 2024-03-18: 0.25 mg via SUBCUTANEOUS

## 2024-03-17 MED ORDER — TERBUTALINE SULFATE 1 MG/ML IJ SOLN: 0.2500 mg | Freq: Once | INTRAMUSCULAR | Status: DC | PRN

## 2024-03-17 MED ORDER — SOD CITRATE-CITRIC ACID 500-334 MG/5ML PO SOLN: 30.0000 mL | ORAL | Status: DC | PRN

## 2024-03-17 MED ORDER — ONDANSETRON HCL 4 MG/2ML IJ SOLN
4.0000 mg | Freq: Four times a day (QID) | INTRAMUSCULAR | Status: DC | PRN
  Filled 2024-03-17: qty 2

## 2024-03-17 MED ORDER — CEFAZOLIN SODIUM-DEXTROSE 2-4 GM/100ML-% IV SOLN
2.0000 g | Freq: Once | INTRAVENOUS | Status: AC
  Administered 2024-03-17: 2 g via INTRAVENOUS
  Filled 2024-03-17: qty 100

## 2024-03-17 MED ORDER — LACTATED RINGERS IV SOLN
INTRAVENOUS | Status: DC
Start: 2024-03-17 — End: 2024-03-17

## 2024-03-17 MED ORDER — OXYTOCIN BOLUS FROM INFUSION
333.0000 mL | Freq: Once | INTRAVENOUS | Status: AC
  Administered 2024-03-18: 333 mL via INTRAVENOUS

## 2024-03-17 MED ORDER — OXYCODONE-ACETAMINOPHEN 5-325 MG PO TABS: 1.0000 | ORAL_TABLET | ORAL | Status: DC | PRN

## 2024-03-17 MED ORDER — CEFAZOLIN SODIUM-DEXTROSE 1-4 GM/50ML-% IV SOLN: 1.0000 g | Freq: Three times a day (TID) | INTRAVENOUS | Status: DC

## 2024-03-17 MED ORDER — CEFAZOLIN SODIUM-DEXTROSE 1-4 GM/50ML-% IV SOLN
1.0000 g | Freq: Three times a day (TID) | INTRAVENOUS | Status: DC
  Administered 2024-03-17 – 2024-03-18 (×4): 1 g via INTRAVENOUS
  Filled 2024-03-17 (×4): qty 50

## 2024-03-17 NOTE — Progress Notes (Signed)
 Introduced Continue plan  Aleiah Mohammed, MD FMOB Fellow, Faculty practice [facility], [facility] for [employer]

## 2024-03-17 NOTE — Plan of Care (Signed)

## 2024-03-17 NOTE — Progress Notes (Signed)
 Labor Progress Note Connie Fernandez is a 28 y.o. G1P0000 at [redacted]w[redacted]d presented for admitted for IOL for IUGR S: coping well, tired and frustrated with slow progress  O:  BP (!) 107/49   Pulse 65   Temp 98.2 F (36.8 C) (Oral)   Ht 5\' 2"  (1.575 m)   Wt 65.7 kg   LMP 06/18/2023 (Exact Date)   BMI 26.48 kg/m   EFM: baseline 145, accels, no decels, moderate variability TOCO: q2-41min contractions  CVE: Dilation: 4 Effacement (%): 90 Station: -2 Presentation: Vertex Exam by:: Lylah Lantis MD   A&P: 28 y.o. G1P0000 [redacted]w[redacted]d admitted for IOL for IUGR #Labor: Minimal change since last check. Discussed role, risks, benefits of IUPC given intermittent variable, pt declined for now.  #Pain: Comfortable #FWB: Cat II; overall reassuring #GBS positive   Darrow End, MD 10:43 PM

## 2024-03-17 NOTE — Progress Notes (Signed)
 Labor Progress Note Connie Fernandez is a 28 y.o. G1P0000 at [redacted]w[redacted]d presented for IOL IUGR S: Patient evaluated at bedside, she is feeling contractions but is able to tolerate them at present. Discussed repeat cervical check and AROM. Patient amenable  O:  BP 110/60   Pulse 67   Temp 98 F (36.7 C) (Oral)   Ht 5\' 2"  (1.575 m)   Wt 65.7 kg   LMP 06/18/2023 (Exact Date)   BMI 26.48 kg/m  EFM: Baseline 125/accels/q2-3  CVE: Dilation: 3 Effacement (%): 50 Station: -2 Presentation: Vertex Exam by:: Irving Mantle MD   A&P: 28 y.o. G1P0000 [redacted]w[redacted]d  #Labor: Progressing well. AROM with small amount of clear fluid #Pain: Well controlled, considering epidural but does not request one at this time #FWB: Category 1 #GBS positive  Frutoso Jing, MD 9:49 AM

## 2024-03-17 NOTE — Progress Notes (Signed)
 LABOR PROGRESS NOTE  Patient Name: Connie Fernandez, female   DOB: 1996/10/07, 28 y.o.  MRN: [id number]  Called for room for prolonged deceleration ~5 minutes. In setting of new tachysystole. Babe recovered with cessation of pitocin and position change to hands/knees. Back to Cat I. Will allow for 20-30 minutes of recovery then start pit if contractions space.  Maud Sorenson, MD

## 2024-03-17 NOTE — H&P (Signed)
 Patient ID: Connie Fernandez, female   DOB: 18-Mar-1996, 28 y.o.   MRN: [id number] OBSTETRIC ADMISSION HISTORY AND PHYSICAL  Tanna Bartek is a 28 y.o. female G1P0000 with IUP at [redacted]w[redacted]d by LMP presenting for IOL due to IUGR. She reports +FMs, No LOF, no VB, no blurry vision, headaches or peripheral edema, and RUQ pain.  She plans on breast feeding. She declined postpartum birth control. She received her prenatal care at [facility].  She has a history of anxiety, major depressive disorder, recurrent episode (HCC), Bipolar 2 disorder (HCC). She is being managed as a high-risk pregnancy due to IGUR, Suboxone  maintenance therapy, history of substance dependence, GBS, and +RV culture.  Also states that she takes half a strip of suboxone  every 2-3 hours at home unless sleeping.  Rx'd # 30/month. After discussion w/pharmacist and pt, ordered as 2mg  q3h prn here.   Dating: By LMP --->  Estimated Date of Delivery: 03/30/24  Sono:    @[redacted]w[redacted]d , CWD, normal anatomy, vertex presentation,  2160g, 5% EFW A recent biophysical profile was reassuring at 8/8 with an AFI of 11.21 cm. Doppler studies showed normal umbilical artery S/D ratio 2.14 with no signs of absent or reversed end-diastolic flow.   Prenatal History/Complications: 1st pregnancy  Past Medical History: Past Medical History:  Diagnosis Date   Acne 02/19/2017   Acute cystitis with hematuria 07/25/2017   Anemia    Anxiety    Chronic pelvic pain in female    Had negative laparoscopy for endometriosis in 2017   Depression    Dysmenorrhea 02/19/2017   Gastritis 08/01/2021   Headache    Migraines   Heart palpitations    History of drug use 07/25/2017   History of drug use 07/25/2017   Opioid use disorder, mild, abuse (HCC) 01/19/2020   Pelvic floor dysfunction 10/05/2016   Primary dysmenorrhea 10/05/2016   Negative laparoscopy for endometriosis     Tobacco use disorder 10/28/2015    Past Surgical History: Past Surgical History:  Procedure  Laterality Date   LAPAROSCOPY N/A 06/12/2016   Procedure: LAPAROSCOPY DIAGNOSTIC;  Surgeon: Ana Balling, MD;  Location: [facility];  Service: Gynecology;  Laterality: N/A;    Obstetrical History: OB History     Gravida  1   Para  0   Term  0   Preterm  0   AB  0   Living  0      SAB  0   IAB  0   Ectopic  0   Multiple  0   Live Births  0           Social History Social History   Socioeconomic History   Marital status: Single    Spouse name: Not on file   Number of children: Not on file   Years of education: Not on file   Highest education level: Not on file  Occupational History   Not on file  Tobacco Use   Smoking status: Every Day    Current packs/day: 0.00    Types: E-cigarettes, Cigarettes   Smokeless tobacco: Never  Vaping Use   Vaping status: Every Day   Substances: Nicotine , Flavoring  Substance and Sexual Activity   Alcohol use: Not Currently    Comment: few drinks per month.    Drug use: Not Currently    Comment: Just stopped Kratum last week   Sexual activity: Yes    Partners: Male    Birth control/protection: None  Other Topics Concern   Not  on file  Social History Narrative   Lives with mother, father, and 2 sisters    Working at [employer] Below 3:30- 9:30pm   1 dog    Right handed    Enjoys working, hanging out with friends         Social Drivers of Corporate investment banker Strain: Not on file  Food Insecurity: No Food Insecurity (03/17/2024)   Hunger Vital Sign    Worried About Running Out of Food in the Last Year: Never true    Ran Out of Food in the Last Year: Never true  Transportation Needs: No Transportation Needs (03/17/2024)   PRAPARE - Administrator, Civil Service (Medical): No    Lack of Transportation (Non-Medical): No  Physical Activity: Not on file  Stress: Not on file  Social Connections: Not on file    Family History: Family History  Problem Relation Age of Onset   Diabetes Mother    Alcohol  abuse Father    Hypertension Father    COPD Maternal Grandmother    Early death Maternal Grandmother 71   Heart disease Maternal Grandmother    Heart disease Maternal Grandfather 20   Parkinsonism Paternal Grandmother     Allergies: Allergies  Allergen Reactions   Latex Itching   Penicillins Rash    Able to take Keflex  and Rocephin  Has patient had a PCN reaction causing immediate rash, facial/tongue/throat swelling, SOB or lightheadedness with hypotension: Dizziness and shaky. Has patient had a PCN reaction causing severe rash involving mucus membranes or skin necrosis: NO Has patient had a PCN reaction that required hospitalization: NO Has patient had a PCN reaction occurring within the last 10 years: NO If all of the above answers are "NO", then may proceed with Cephalosporin use.     Medications Prior to Admission  Medication Sig Dispense Refill Last Dose/Taking   aspirin  EC 81 MG tablet Take 1 tablet (81 mg total) by mouth at bedtime. Start taking when you are [redacted] weeks pregnant for rest of pregnancy for prevention of preeclampsia 300 tablet 2 03/16/2024   Buprenorphine  HCl-Naloxone  HCl (SUBOXONE ) 8-2 MG FILM Place 1 Film under the tongue in the morning and at bedtime. 60 Film 0 03/16/2024 Bedtime   Prenatal MV & Min w/FA-DHA (PRENATAL GUMMIES PO) Take by mouth.   03/16/2024     Review of Systems   All systems reviewed and negative except as stated in HPI  Blood pressure 106/61, pulse 63, temperature 97.7 F (36.5 C), temperature source Oral, height 5\' 2"  (1.575 m), weight 65.7 kg, last menstrual period 06/18/2023. General appearance: alert, cooperative, and no distress Lungs: clear to auscultation bilaterally Heart: regular rate and rhythm Abdomen: soft, non-tender; bowel sounds normal Pelvic: 3/50/-2 Extremities: Homans sign is negative, no sign of DVT DTR's 2+ Presentation: cephalic Fetal monitoringBaseline: 125 bpm, Variability: Good {> 6 bpm), Accelerations:  Reactive, and Decelerations: Absent Uterine activityNone Dilation: 3 Effacement (%): 60 Station: -2 Exam by:: Walda Guiles, RN   Prenatal labs: ABO, Rh: --/--/B POS (05/13 0041) Antibody: NEG (05/13 0041) Rubella: <0.90 (11/11 1543) RPR: Non Reactive (02/19 0911)  HBsAg: Negative (11/11 1543)  HIV: Non Reactive (02/19 0911)  GBS: Positive/-- (04/30 0805)    Lab Results  Component Value Date   GBS Positive (A) 03/04/2024   GTT normal 3 hour GTT Genetic screening  NIPS: LR M, AFP: Negative Anatomy US  Normal anatomy, vertex, IUGR  Immunization History  Administered Date(s) Administered   Influenza,inj,Quad PF,6+ Mos  08/05/2017   Tdap 01/21/2011    Prenatal Transfer Tool  Maternal Diabetes: No Genetic Screening: Normal Maternal Ultrasounds/Referrals: IUGR Fetal Ultrasounds or other Referrals:  Referred to Materal Fetal Medicine  Maternal Substance Abuse:  Yes:  Type: Other:  Patient reports she has been using Kratom Significant Maternal Medications:  Meds include: Other: Suboxone  Significant Maternal Lab Results: Group B Strep positive, allergic to penicillin , started on ancef Number of Prenatal Visits:greater than 3 verified prenatal visits Maternal Vaccinations:none Other Comments:  None   Results for orders placed or performed during the hospital encounter of 03/17/24 (from the past 24 hours)  CBC   Collection Time: 03/17/24 12:41 AM  Result Value Ref Range   WBC 7.2 4.0 - 10.5 K/uL   RBC 3.61 (L) 3.87 - 5.11 MIL/uL   Hemoglobin 9.6 (L) 12.0 - 15.0 g/dL   HCT 16.0 (L) 10.9 - 32.3 %   MCV 85.9 80.0 - 100.0 fL   MCH 26.6 26.0 - 34.0 pg   MCHC 31.0 30.0 - 36.0 g/dL   RDW 55.7 32.2 - 02.5 %   Platelets 225 150 - 400 K/uL   nRBC 0.0 0.0 - 0.2 %  Comprehensive metabolic panel   Collection Time: 03/17/24 12:41 AM  Result Value Ref Range   Sodium 133 (L) 135 - 145 mmol/L   Potassium 4.4 3.5 - 5.1 mmol/L   Chloride 104 98 - 111 mmol/L   CO2 22 22 - 32 mmol/L    Glucose, Bld 77 70 - 99 mg/dL   BUN 6 6 - 20 mg/dL   Creatinine, Ser 4.27 0.44 - 1.00 mg/dL   Calcium 8.7 (L) 8.9 - 10.3 mg/dL   Total Protein 5.6 (L) 6.5 - 8.1 g/dL   Albumin 2.9 (L) 3.5 - 5.0 g/dL   AST 29 15 - 41 U/L   ALT 13 0 - 44 U/L   Alkaline Phosphatase 98 38 - 126 U/L   Total Bilirubin 0.6 0.0 - 1.2 mg/dL   GFR, Estimated >06 >23 mL/min   Anion gap 7 5 - 15  Type and screen   Collection Time: 03/17/24 12:41 AM  Result Value Ref Range   ABO/RH(D) B POS    Antibody Screen NEG    Sample Expiration      03/20/2024,2359 Performed at [facility], [address], [place], [place] [address]     Patient Active Problem List   Diagnosis Date Noted   IUGR (intrauterine growth restriction) affecting care of mother 03/17/2024   GBS (group B Streptococcus carrier), +RV culture, currently pregnant 03/09/2024   IUGR (intrauterine growth restriction) affecting care of mother, third trimester 02/28/2024   Suboxone  maintenance treatment complicating pregnancy, antepartum, third trimester (HCC) 02/25/2024   Rubella non-immune status, antepartum 09/17/2023   Supervision of normal first pregnancy, antepartum 09/16/2023   Bipolar 2 disorder (HCC) 01/19/2020   Substance dependence during pregnancy (HCC) 01/19/2020   Anxiety 10/28/2015   Major depressive disorder, recurrent episode (HCC) 10/28/2015    Assessment/Plan:  Hadli Pletcher is a 28 y.o. G1P0000 at [redacted]w[redacted]d here for IOL due to IUGR.   #Labor:Cytotec ordered w/standing orders and given  #Pain: epidural  #FWB: Cat 1  #GBS status:   - Positive 03/04/24 - allergic to penicillin , start ancef  #Feeding: Breastmilk   #Reproductive Life planning: None  #Circ:  yes outpatient  Majel Scott, CNM  03/17/2024, 6:27 AM

## 2024-03-18 ENCOUNTER — Encounter: Admitting: Obstetrics & Gynecology

## 2024-03-18 ENCOUNTER — Inpatient Hospital Stay (HOSPITAL_COMMUNITY): Admitting: Anesthesiology

## 2024-03-18 ENCOUNTER — Encounter (HOSPITAL_COMMUNITY): Payer: Self-pay | Admitting: Family Medicine

## 2024-03-18 DIAGNOSIS — Z3A38 38 weeks gestation of pregnancy: Secondary | ICD-10-CM

## 2024-03-18 DIAGNOSIS — O99344 Other mental disorders complicating childbirth: Secondary | ICD-10-CM

## 2024-03-18 DIAGNOSIS — O99820 Streptococcus B carrier state complicating pregnancy: Secondary | ICD-10-CM

## 2024-03-18 DIAGNOSIS — O365930 Maternal care for other known or suspected poor fetal growth, third trimester, not applicable or unspecified: Secondary | ICD-10-CM

## 2024-03-18 MED ORDER — SODIUM CHLORIDE 0.9% FLUSH: 3.0000 mL | Freq: Two times a day (BID) | INTRAVENOUS | Status: DC

## 2024-03-18 MED ORDER — MEASLES, MUMPS & RUBELLA VAC IJ SOLR
0.5000 mL | Freq: Once | INTRAMUSCULAR | Status: AC
  Administered 2024-03-20: 0.5 mL via SUBCUTANEOUS
  Filled 2024-03-18: qty 0.5

## 2024-03-18 MED ORDER — LIDOCAINE HCL (PF) 1 % IJ SOLN
INTRAMUSCULAR | Status: DC | PRN
  Administered 2024-03-18: 10 mL via EPIDURAL

## 2024-03-18 MED ORDER — ACETAMINOPHEN 325 MG PO TABS: 650.0000 mg | ORAL_TABLET | ORAL | Status: DC | PRN

## 2024-03-18 MED ORDER — LACTATED RINGERS AMNIOINFUSION
INTRAVENOUS | Status: DC
  Administered 2024-03-18: 300 mL via INTRAUTERINE

## 2024-03-18 MED ORDER — BENZOCAINE-MENTHOL 20-0.5 % EX AERO
1.0000 | INHALATION_SPRAY | CUTANEOUS | Status: DC | PRN
  Filled 2024-03-18: qty 56

## 2024-03-18 MED ORDER — PHENYLEPHRINE 80 MCG/ML (10ML) SYRINGE FOR IV PUSH (FOR BLOOD PRESSURE SUPPORT): 80.0000 ug | PREFILLED_SYRINGE | INTRAVENOUS | Status: DC | PRN

## 2024-03-18 MED ORDER — TRANEXAMIC ACID-NACL 1000-0.7 MG/100ML-% IV SOLN: 1000.0000 mg | Freq: Once | INTRAVENOUS | Status: AC

## 2024-03-18 MED ORDER — EPHEDRINE 5 MG/ML INJ: 10.0000 mg | INTRAVENOUS | Status: DC | PRN

## 2024-03-18 MED ORDER — LACTATED RINGERS IV SOLN
500.0000 mL | INTRAVENOUS | Status: DC | PRN
  Administered 2024-03-18 (×2): 500 mL via INTRAVENOUS

## 2024-03-18 MED ORDER — FENTANYL-BUPIVACAINE-NACL 0.5-0.125-0.9 MG/250ML-% EP SOLN
12.0000 mL/h | EPIDURAL | Status: DC | PRN
  Administered 2024-03-18: 12 mL/h via EPIDURAL
  Filled 2024-03-18: qty 250

## 2024-03-18 MED ORDER — TETANUS-DIPHTH-ACELL PERTUSSIS 5-2.5-18.5 LF-MCG/0.5 IM SUSY: 0.5000 mL | PREFILLED_SYRINGE | Freq: Once | INTRAMUSCULAR | Status: DC

## 2024-03-18 MED ORDER — DIBUCAINE (PERIANAL) 1 % EX OINT: 1.0000 | TOPICAL_OINTMENT | CUTANEOUS | Status: DC | PRN

## 2024-03-18 MED ORDER — IBUPROFEN 600 MG PO TABS
600.0000 mg | ORAL_TABLET | Freq: Four times a day (QID) | ORAL | Status: DC
  Administered 2024-03-19 – 2024-03-20 (×3): 600 mg via ORAL
  Filled 2024-03-18 (×6): qty 1

## 2024-03-18 MED ORDER — SODIUM CHLORIDE 0.9% FLUSH: 3.0000 mL | INTRAVENOUS | Status: DC | PRN

## 2024-03-18 MED ORDER — LACTATED RINGERS IV SOLN
500.0000 mL | Freq: Once | INTRAVENOUS | Status: AC
  Administered 2024-03-18: 500 mL via INTRAVENOUS

## 2024-03-18 MED ORDER — SODIUM CHLORIDE 0.9 % IV SOLN: 250.0000 mL | INTRAVENOUS | Status: DC | PRN

## 2024-03-18 MED ORDER — DIPHENHYDRAMINE HCL 50 MG/ML IJ SOLN: 12.5000 mg | INTRAMUSCULAR | Status: DC | PRN

## 2024-03-18 MED ORDER — ONDANSETRON HCL 4 MG PO TABS: 4.0000 mg | ORAL_TABLET | ORAL | Status: DC | PRN

## 2024-03-18 MED ORDER — PRENATAL MULTIVITAMIN CH
1.0000 | ORAL_TABLET | Freq: Every day | ORAL | Status: DC
  Filled 2024-03-18: qty 1

## 2024-03-18 MED ORDER — SENNOSIDES-DOCUSATE SODIUM 8.6-50 MG PO TABS
2.0000 | ORAL_TABLET | ORAL | Status: DC
Start: 2024-03-19 — End: 2024-03-20
  Administered 2024-03-20: 2 via ORAL
  Filled 2024-03-18 (×2): qty 2

## 2024-03-18 MED ORDER — COCONUT OIL OIL: 1.0000 | TOPICAL_OIL | Status: DC | PRN

## 2024-03-18 MED ORDER — WITCH HAZEL-GLYCERIN EX PADS: 1.0000 | MEDICATED_PAD | CUTANEOUS | Status: DC | PRN

## 2024-03-18 MED ORDER — BUPRENORPHINE HCL 8 MG SL SUBL: 8.0000 mg | SUBLINGUAL_TABLET | Freq: Two times a day (BID) | SUBLINGUAL | Status: DC

## 2024-03-18 MED ORDER — PHENYLEPHRINE 80 MCG/ML (10ML) SYRINGE FOR IV PUSH (FOR BLOOD PRESSURE SUPPORT)
80.0000 ug | PREFILLED_SYRINGE | INTRAVENOUS | Status: DC | PRN
  Administered 2024-03-18: 80 ug via INTRAVENOUS
  Filled 2024-03-18: qty 10

## 2024-03-18 MED ORDER — LACTATED RINGERS IV SOLN: INTRAVENOUS | Status: DC

## 2024-03-18 MED ORDER — TRANEXAMIC ACID-NACL 1000-0.7 MG/100ML-% IV SOLN
INTRAVENOUS | Status: AC
Start: 2024-03-18 — End: 2024-03-18
  Administered 2024-03-18: 1000 mg via INTRAVENOUS
  Filled 2024-03-18: qty 100

## 2024-03-18 MED ORDER — ONDANSETRON HCL 4 MG/2ML IJ SOLN: 4.0000 mg | INTRAMUSCULAR | Status: DC | PRN

## 2024-03-18 MED ORDER — ZOLPIDEM TARTRATE 5 MG PO TABS: 5.0000 mg | ORAL_TABLET | Freq: Every evening | ORAL | Status: DC | PRN

## 2024-03-18 MED ORDER — DIPHENHYDRAMINE HCL 25 MG PO CAPS: 25.0000 mg | ORAL_CAPSULE | Freq: Four times a day (QID) | ORAL | Status: DC | PRN

## 2024-03-18 MED ORDER — SIMETHICONE 80 MG PO CHEW: 80.0000 mg | CHEWABLE_TABLET | ORAL | Status: DC | PRN

## 2024-03-18 NOTE — Lactation Note (Signed)
 This note was copied from a baby's chart. Lactation Consultation Note  Patient Name: Connie Fernandez [id number] Date: 03/18/2024 Age:28 hours  Attempted to see mom but had a room full of visitors. Mom will call when next feeding time.   Maternal Data    Feeding    LATCH Score Latch: Repeated attempts needed to sustain latch, nipple held in mouth throughout feeding, stimulation needed to elicit sucking reflex.  Audible Swallowing: A few with stimulation  Type of Nipple: Everted at rest and after stimulation  Comfort (Breast/Nipple): Soft / non-tender  Hold (Positioning): Full assist, staff holds infant at breast  [facility] Score: 6   Lactation Tools Discussed/Used    Interventions    Discharge    Consult Status      Jabez Molner G 03/18/2024, 8:22 PM

## 2024-03-18 NOTE — Discharge Summary (Signed)
 Postpartum Discharge Summary  Date of Service updated***     Patient Name: Connie Fernandez DOB: 02-05-1996 MRN: [id number]  Date of admission: 03/17/2024 Delivery date:03/18/2024 Delivering provider: Teena Feast Date of discharge: 03/18/2024  Admitting diagnosis: IUGR (intrauterine growth restriction) affecting care of mother [O36.5990] Intrauterine pregnancy: [redacted]w[redacted]d     Secondary diagnosis:  Principal Problem:   IUGR (intrauterine growth restriction) affecting care of mother  Additional problems: Hx of substance use on Suboxone , Rubella non-immune, GBS+    Discharge diagnosis: Term Pregnancy Delivered                                              Post partum procedures:{Postpartum procedures:23558} Augmentation: AROM, Pitocin, and Cytotec Complications: None  Hospital course: Induction of Labor With Vaginal Delivery   28 y.o. yo G1P1001 at [redacted]w[redacted]d was admitted to the hospital 03/17/2024 for induction of labor.  Indication for induction: IUGR.  Patient had an labor course complicated by deep recurrent variables while pushing. She had been pushing for 1.5hours at that point, at which time it was recommended that she proceed with vacuum assisted delivery, no popoffs over 3 contractions.  Membrane Rupture Time/Date: 9:46 AM,03/17/2024  Delivery Method:Vaginal, Vacuum (Extractor) Operative Delivery:Device used:Vacuum Indication: Fetal indications Episiotomy: None Lacerations:  1st degree Details of delivery can be found in separate delivery note.  Patient had a postpartum course complicated by***. Patient is discharged home 03/18/24.  Newborn Data: Birth date:03/18/2024 Birth time:5:00 PM Gender:Female Living status:Living Apgars:7 ,9  Weight:2820 g  Magnesium  Sulfate received: No BMZ received: No Rhophylac:No MMR:{MMR:30440033}ordered  T-DaP:Declined prenatally Flu: N/A RSV Vaccine received: No Transfusion:{Transfusion received:30440034}  Immunizations  received: Immunization History  Administered Date(s) Administered   Influenza,inj,Quad PF,6+ Mos 08/05/2017   Tdap 01/21/2011    Physical exam  Vitals:   03/18/24 1730 03/18/24 1750 03/18/24 1801 03/18/24 1818  BP: 109/66 111/70 115/76 108/66  Pulse: 86 82 79 86  Temp:      TempSrc:      SpO2:      Weight:      Height:       General: {Exam; general:21111117} Lochia: {Desc; appropriate/inappropriate:30686::"appropriate"} Uterine Fundus: {Desc; firm/soft:30687} Incision: {Exam; incision:21111123} DVT Evaluation: {Exam; dvt:2111122} Labs: Lab Results  Component Value Date   WBC 7.2 03/17/2024   HGB 9.6 (L) 03/17/2024   HCT 31.0 (L) 03/17/2024   MCV 85.9 03/17/2024   PLT 225 03/17/2024      Latest Ref Rng & Units 03/17/2024   12:41 AM  CMP  Glucose 70 - 99 mg/dL 77   BUN 6 - 20 mg/dL 6   Creatinine 4.09 - 8.11 mg/dL 9.14   Sodium 782 - 956 mmol/L 133   Potassium 3.5 - 5.1 mmol/L 4.4   Chloride 98 - 111 mmol/L 104   CO2 22 - 32 mmol/L 22   Calcium 8.9 - 10.3 mg/dL 8.7   Total Protein 6.5 - 8.1 g/dL 5.6   Total Bilirubin 0.0 - 1.2 mg/dL 0.6   Alkaline Phos 38 - 126 U/L 98   AST 15 - 41 U/L 29   ALT 0 - 44 U/L 13    Edinburgh Score:     No data to display         No data recorded  After visit meds:  Allergies as of 03/18/2024       Reactions  Latex Itching   Penicillins Rash   Able to take Keflex  and Rocephin  Has patient had a PCN reaction causing immediate rash, facial/tongue/throat swelling, SOB or lightheadedness with hypotension: Dizziness and shaky. Has patient had a PCN reaction causing severe rash involving mucus membranes or skin necrosis: NO Has patient had a PCN reaction that required hospitalization: NO Has patient had a PCN reaction occurring within the last 10 years: NO If all of the above answers are "NO", then may proceed with Cephalosporin use.     Med Rec must be completed prior to using this [facility]***        Discharge home  in stable condition Infant Feeding: {Baby feeding:23562} Infant Disposition:{CHL IP OB HOME WITH NGEXBM:84132} Discharge instruction: per After Visit Summary and Postpartum booklet. Activity: Advance as tolerated. Pelvic rest for 6 weeks.  Diet: {OB GMWN:02725366} Future Appointments: Future Appointments  Date Time Provider Department Center  03/24/2024  4:15 PM Teena Feast, MD [facility]   Follow up Visit: Message to [facility] 5/14  Please schedule this patient for a In person postpartum visit in 4 weeks with the following provider: Any provider. Additional Postpartum F/U:n/a  High risk pregnancy complicated by: FGR, Suboxone  Delivery mode:  Vaginal, Vacuum (Extractor) Anticipated Birth Control:  declines   03/18/2024 Ebony Goldstein, MD

## 2024-03-18 NOTE — Progress Notes (Addendum)
 LABOR PROGRESS NOTE  Connie Fernandez is a 28 y.o. G1P0000 at [redacted]w[redacted]d  admitted for IOL for IUGR 5% with normal UAD.  Subjective: Not feeling much, overall very tired from multiday induction  Objective: BP (!) 99/48 Comment: pt on side; asymptomatic  Pulse 81   Temp 98.5 F (36.9 C) (Oral)   Ht 5\' 2"  (1.575 m)   Wt 65.7 kg   LMP 06/18/2023 (Exact Date)   BMI 26.48 kg/m  or  Vitals:   03/18/24 0340 03/18/24 0625 03/18/24 0735 03/18/24 0835  BP: (!) 100/50 118/65 (!) 100/53 (!) 99/48  Pulse: 67 87 80 81  Temp: 97.6 F (36.4 C) 98.2 F (36.8 C) 98.5 F (36.9 C)   TempSrc: Axillary Axillary Oral   Weight:      Height:        Physical Exam Vitals reviewed.  Constitutional:      General: She is not in acute distress.    Appearance: She is well-developed. She is not diaphoretic.  Cardiovascular:     Rate and Rhythm: Normal rate and regular rhythm.     Heart sounds: Normal heart sounds. No murmur heard. Pulmonary:     Effort: Pulmonary effort is normal. No respiratory distress.     Breath sounds: Normal breath sounds. No wheezing or rales.  Abdominal:     General: Bowel sounds are normal. There is no distension.     Palpations: Abdomen is soft.     Tenderness: There is no abdominal tenderness. There is no guarding or rebound.  Skin:    General: Skin is warm and dry.  Neurological:     Mental Status: She is alert.     Coordination: Coordination normal.     Dilation: 4 Effacement (%): 90 Station: -2 Presentation: Vertex Exam by:: Dr. Sharalyn Dasen present  FHT Baseline: 145 bpm Variability: Good {> 6 bpm) Accelerations: Reactive Decelerations: Absent Uterine activity: q3 min regular Cat: I  Labs: Lab Results  Component Value Date   WBC 7.2 03/17/2024   HGB 9.6 (L) 03/17/2024   HCT 31.0 (L) 03/17/2024   MCV 85.9 03/17/2024   PLT 225 03/17/2024    Patient Active Problem List   Diagnosis Date Noted   IUGR (intrauterine growth restriction) affecting  care of mother 03/17/2024   GBS (group B Streptococcus carrier), +RV culture, currently pregnant 03/09/2024   IUGR (intrauterine growth restriction) affecting care of mother, third trimester 02/28/2024   Suboxone  maintenance treatment complicating pregnancy, antepartum, third trimester (HCC) 02/25/2024   Rubella non-immune status, antepartum 09/17/2023   Supervision of normal first pregnancy, antepartum 09/16/2023   Bipolar 2 disorder (HCC) 01/19/2020   Substance dependence during pregnancy (HCC) 01/19/2020   Anxiety 10/28/2015   Major depressive disorder, recurrent episode (HCC) 10/28/2015    Assessment / Plan: 28 y.o. G1P0000 at [redacted]w[redacted]d here for IOL for IUGR.  Labor: prolonged IOL, has had FB, miso x1, AROM yesterday morning, and multiple rounds of pitocin on and off due to the tracing. On my exam had a forebag with moderate amount of clear fluid after rupture. Given effacement hopefully this will move things along. Also discussed epidural and position changes with peanut due to suspected malpositioning, patient in agreement with this plan.  Fetal Wellbeing:  Cat I Pain Control:  planning epidural GBS: neg Anticipated MOD:  NSVD  OUD in remission: on suboxone  2mg  q3h which is concordant with how she has been cutting up and taking her 8 mg films at home, no issues  Lorinda Root  Ilona Malta, MD/MPH Attending Family Medicine Physician, [facility] for [facility], [facility]   03/18/2024, 9:31 AM

## 2024-03-18 NOTE — Lactation Note (Signed)
 This note was copied from a baby's chart. Lactation Consultation Note  Patient Name: Connie Fernandez [id number] Date: 03/18/2024 Age:28 hours Reason for consult: Initial assessment;Primapara;Early term 55-38.6wks New mom w/sleepy baby. Baby has no interest in BF at this time. Very sleepy. Baby has significant bruising to head from suction. Noted slight edema causing areola to be bulbous. Nipple everts well. Gave mom shells to wear in am to assist in edema reduction. Colostrum easily expressed. Praised mom. Newborn feeding habits, behavior, STS,I&O, positioning, support, body alignment reviewed. Mom encouraged to feed baby 8-12 times/24 hours and with feeding cues.  Encouraged mom to call for assistance if needed.   Maternal Data Has patient been taught Hand Expression?: Yes Does the patient have breastfeeding experience prior to this delivery?: No  Feeding    LATCH Score Latch: Too sleepy or reluctant, no latch achieved, no sucking elicited.  Audible Swallowing: None  Type of Nipple: Everted at rest and after stimulation  Comfort (Breast/Nipple): Filling, red/small blisters or bruises, mild/mod discomfort (some edema)  Hold (Positioning): Full assist, staff holds infant at breast  LATCH Score: 3   Lactation Tools Discussed/Used Tools: Shells  Interventions Interventions: Breast feeding basics reviewed;Assisted with latch;Skin to skin;Hand express;Reverse pressure;Breast compression;Adjust position;Support pillows;Position options;Shells;Education;LC Services brochure  Discharge    Consult Status Consult Status: Follow-up Date: 03/19/24 Follow-up type: In-patient    [name] G 03/18/2024, 9:29 PM

## 2024-03-18 NOTE — Anesthesia Procedure Notes (Signed)
 Epidural Patient location during procedure: OB Start time: 03/18/2024 9:50 AM End time: 03/18/2024 10:00 AM  Staffing Anesthesiologist: Grace Laura, MD Performed: anesthesiologist   Preanesthetic Checklist Completed: patient identified, IV checked, risks and benefits discussed, monitors and equipment checked, pre-op evaluation and timeout performed  Epidural Patient position: sitting Prep: DuraPrep and site prepped and draped Patient monitoring: continuous pulse ox, blood pressure, heart rate and cardiac monitor Approach: midline Location: L3-L4 Injection technique: LOR air  Needle:  Needle type: Tuohy  Needle gauge: 17 G Needle length: 9 cm Needle insertion depth: 5 cm Catheter type: closed end flexible Catheter size: 19 Gauge Catheter at skin depth: 10 cm Test dose: negative  Assessment Sensory level: T8 Events: blood not aspirated, no cerebrospinal fluid, injection not painful, no injection resistance, no paresthesia and negative IV test  Additional Notes Patient identified. Risks/Benefits/Options discussed with patient including but not limited to bleeding, infection, nerve damage, paralysis, failed block, incomplete pain control, headache, blood pressure changes, nausea, vomiting, reactions to medication both or allergic, itching and postpartum back pain. Confirmed with bedside nurse the patient's most recent platelet count. Confirmed with patient that they are not currently taking any anticoagulation, have any bleeding history or any family history of bleeding disorders. Patient expressed understanding and wished to proceed. All questions were answered. Sterile technique was used throughout the entire procedure. Please see nursing notes for vital signs. Test dose was given through epidural catheter and negative prior to continuing to dose epidural or start infusion. Warning signs of high block given to the patient including shortness of breath, tingling/numbness in  hands, complete motor block, or any concerning symptoms with instructions to call for help. Patient was given instructions on fall risk and not to get out of bed. All questions and concerns addressed with instructions to call with any issues or inadequate analgesia.  Reason for block:procedure for pain

## 2024-03-18 NOTE — Anesthesia Preprocedure Evaluation (Addendum)
 Anesthesia Evaluation  Patient identified by MRN, date of birth, ID band Patient awake    Reviewed: Allergy & Precautions, NPO status , Patient's Chart, lab work & pertinent test results  Airway Mallampati: II  TM Distance: >3 FB Neck ROM: Full    Dental no notable dental hx.    Pulmonary Current SmokerPatient did not abstain from smoking.   Pulmonary exam normal breath sounds clear to auscultation       Cardiovascular negative cardio ROS Normal cardiovascular exam Rhythm:Regular Rate:Normal     Neuro/Psych  Headaches PSYCHIATRIC DISORDERS Anxiety Depression Bipolar Disorder    negative psych ROS   GI/Hepatic negative GI ROS,,,(+)     substance abuse (on suboxone )    Endo/Other  negative endocrine ROS    Renal/GU negative Renal ROS  negative genitourinary   Musculoskeletal negative musculoskeletal ROS (+)    Abdominal   Peds  Hematology  (+) Blood dyscrasia, anemia Lab Results      Component                Value               Date                      WBC                      7.2                 03/17/2024                HGB                      9.6 (L)             03/17/2024                HCT                      31.0 (L)            03/17/2024                MCV                      85.9                03/17/2024                PLT                      225                 03/17/2024           ]   Anesthesia Other Findings IOL for IUGR   Reproductive/Obstetrics (+) Pregnancy                             Anesthesia Physical Anesthesia Plan  ASA: 2  Anesthesia Plan: Epidural   Post-op Pain Management:    Induction:   PONV Risk Score and Plan: Treatment may vary due to age or medical condition  Airway Management Planned: Natural Airway  Additional Equipment:   Intra-op Plan:   Post-operative Plan:   Informed Consent: I have reviewed the patients History and Physical,  chart, labs and discussed the procedure including the risks, benefits  and alternatives for the proposed anesthesia with the patient or authorized representative who has indicated his/her understanding and acceptance.       Plan Discussed with: Anesthesiologist  Anesthesia Plan Comments: (Patient identified. Risks, benefits, options discussed with patient including but not limited to bleeding, infection, nerve damage, paralysis, failed block, incomplete pain control, headache, blood pressure changes, nausea, vomiting, reactions to medication, itching, and post partum back pain. Confirmed with bedside nurse the patient's most recent platelet count. Confirmed with the patient that they are not taking any anticoagulation, have any bleeding history or any family history of bleeding disorders. Patient expressed understanding and wishes to proceed. All questions were answered. )       Anesthesia Quick Evaluation

## 2024-03-19 LAB — CBC
HCT: 25.8 % — ABNORMAL LOW (ref 36.0–46.0)
Hemoglobin: 8.4 g/dL — ABNORMAL LOW (ref 12.0–15.0)
MCH: 26.6 pg (ref 26.0–34.0)
MCHC: 32.6 g/dL (ref 30.0–36.0)
MCV: 81.6 fL (ref 80.0–100.0)
Platelets: 169 10*3/uL (ref 150–400)
RBC: 3.16 MIL/uL — ABNORMAL LOW (ref 3.87–5.11)
RDW: 13.4 % (ref 11.5–15.5)
WBC: 12.1 10*3/uL — ABNORMAL HIGH (ref 4.0–10.5)
nRBC: 0 % (ref 0.0–0.2)

## 2024-03-19 MED ORDER — POLYSACCHARIDE IRON COMPLEX 150 MG PO CAPS
150.0000 mg | ORAL_CAPSULE | Freq: Every day | ORAL | Status: DC
  Administered 2024-03-20: 150 mg via ORAL
  Filled 2024-03-19 (×2): qty 1

## 2024-03-19 NOTE — Patient Instructions (Signed)
 If interested in an outpatient lactation consult in office or virtually please reach out to [place] at [facility] (First Floor) [address], [place] Please call [phone] and press 4 for lactation.

## 2024-03-19 NOTE — Progress Notes (Signed)
 POSTPARTUM PROGRESS NOTE  Post Partum Day 1  Subjective:  Connie Fernandez is a 28 y.o.  G1P1001 s/p SVD at [redacted]w[redacted]d .  No acute events overnight.  Pt denies problems with ambulating, voiding or po intake.  She denies nausea or vomiting.  Pain is well controlled.  She has had flatus. She has not had bowel movement.  Lochia Small.   Objective: Blood pressure 114/72, pulse (!) 57, temperature 98.3 F (36.8 C), temperature source Oral, resp. rate 19, height 5\' 6"  (1.676 m), weight 99.3 kg, last menstrual period 09/07/2023, SpO2 100%.  Physical Exam:  General: alert, cooperative and no distress Chest: no respiratory distress Heart:regular rate, distal pulses intact Abdomen: soft, nontender,  Uterine Fundus: firm, appropriately tender DVT Evaluation: No calf swelling or tenderness Extremities: trace edema Skin: warm, dry  Recent Labs    09/22/23 0908 09/23/23 0856  HGB 13.3 14.0  HCT 40.4 42.2    Assessment/Plan: Connie Fernandez is a 28 y.o. G1P1001 s/p SCD at [redacted]w[redacted]d  PPD#1 - Doing well, pain well controlled on current regimen Contraception: None Feeding:  Breast feeding Dispo: Plan for discharge tomorrow.  #SUD in remission, on Suboxone  -Receiving suboxone  2mg  q3h PRN on post partum  Frutoso Jing, MD 8:37 AM

## 2024-03-19 NOTE — Clinical Social Work Maternal (Signed)
 CLINICAL SOCIAL WORK MATERNAL/CHILD NOTE  Patient Details  Name: Connie Fernandez MRN: [id number] Date of Birth: Jul 04, 1996  Date:  03/19/2024  Clinical Social Worker Initiating Note:  Jenney Modest Date/Time: Initiated:  03/19/24/1534     Child's Name:  Marlys Singh   Biological Parents:  Mother, Father Clotee Gagnard [birth year] 1996 Norene Beards 10-31-1989)   Need for Interpreter:  None   Reason for Referral:  Behavioral Health Concerns, Current Substance Use/Substance Use During Pregnancy     Address:  [address] [place] [address]    Phone number:  [phone] (home)     Additional phone number:   Household Members/Support Persons (HM/SP):   Household Member/Support Person 1   HM/SP Name Relationship DOB or Age  HM/SP -1 Norene Beards FOB 10-31-1989  HM/SP -2        HM/SP -3        HM/SP -4        HM/SP -5        HM/SP -6        HM/SP -7        HM/SP -8          Natural Supports (not living in the home):  Immediate Family, Spouse/significant other, Friends, Extended Family, Investment banker, corporate Supports: None   Employment: Environmental education officer   Type of Work: [employer]   Education:  High school graduate   Homebound arranged:    Surveyor, quantity Resources:  Medicaid   Other Resources:  [facility]   Cultural/Religious Considerations Which May Impact Care:    Strengths:  Ability to meet basic needs  , Understanding of illness, Home prepared for child  , Pediatrician chosen   Psychotropic Medications:         Pediatrician:    [employer]  Pediatrician List:   [employer] [place]    [facility]  [facility]      Pediatrician Fax Number:    Risk Factors/Current Problems:  Substance Use  , Mental Health Concerns     Cognitive State:  Alert  , Able to Concentrate  , Insightful  , Goal Oriented  , Linear Thinking     Mood/Affect:  Calm  , Comfortable  , Interested  , Relaxed      CSW Assessment: CSW received a consult for Substance abuse hx and Bipolar. CSW met MOB at bedside to complete a full psychosocial assessment and offer support. CSW entered the room, introduced herself and acknowledged FOB was present. CSW asked MOB for privacy reasons could FOB stepout for the assessment; MOB was agreeable and FOB stepped out. CSW explained her role and the reason for the visit. MOB presented bonding/feeding the infant as they both laid safely in the bed. MOB was polite, easy to engage, receptive to meeting with CSW, and appeared forthcoming.  CSW collected MOB's demographic information and she reported recently moving to [address] [place], [place] [address]. CSW inquired about MOB's mental health history. MOB reported being diagnosed with Anxiety,Depression and Bipolar at the age of 28 years old. MOB reported her Bipolar as maniac and depressive with her last episode being 4 years ago. MOB reported being prescribed Zoloft  in 2017; however denied any medication regiment currently. MOB reported coping skills that included working and insuring she has a routine within her life. CSW asked MOB if therapy resources were needed for this PP period. MOB was agreeable; CSW provided MOB with therapy  resources. MOB reported being interested in medication support as well prior to discharge; CSW will update support staff on her decision to begin medication in support of her mental health. CSW provided education regarding the baby blues period vs. perinatal mood disorders, discussed treatment and gave resources for mental health follow up if concerns arise.  CSW recommends self-evaluation during the postpartum time period using the New Mom Checklist from Postpartum Progress and encouraged MOB to contact a medical professional if symptoms are noted at any time.  CSW assessed for safety with MOB SI/HI/DV;MOB denied all.  CSW asked MOB does she receive support resources; MOB said yes(WIC). MOB reported having  all essential items for the infant including a carseat, bassinet and crib for safe sleeping. CSW provided review of Sudden Infant Death Syndrome (SIDS) precautions.    CSW informed MOB due to Kratom and Subuxone during her pregnancy; the hospital will perform a UDS and CDS on the infant. If the screenings return with positive results a report to CPS will be made; MOB was understanding.  MOB reported using Kratom for 3 years prior to pregnancy and once she became she tried to stop; however the withdrawals symptoms were unmanageable. MOB reported using Kratom on a daily basis during pregnancy and once she reached out for support; she was able to connect with the REACH clinic. MOB reported beginning Suboxone  in April 2025; which the support has been helpful, and she will visit with the clinic every Tuesday during this PP period.  CSW will continue to follow the CDS and UDS; and make a CPS report if needed.   CSW Plan/Description:  No Further Intervention Required/No Barriers to Discharge, Sudden Infant Death Syndrome (SIDS) Education, Perinatal Mood and Anxiety Disorder (PMADs) Education, Hospital Drug Screen Policy Information, Other Information/Referral to [facility], CSW Will Continue to Monitor Umbilical Cord Tissue Drug Screen Results and Make Report if Sonnie Dusky 03/19/2024, 3:36 PM

## 2024-03-19 NOTE — Anesthesia Postprocedure Evaluation (Signed)
 Anesthesia Post Note  Patient: Connie Fernandez  Procedure(s) Performed: AN AD HOC LABOR EPIDURAL     Patient location during evaluation: Mother Baby Anesthesia Type: Epidural Level of consciousness: awake Pain management: satisfactory to patient Vital Signs Assessment: post-procedure vital signs reviewed and stable Respiratory status: spontaneous breathing Cardiovascular status: stable Anesthetic complications: no  No notable events documented.  Last Vitals:  Vitals:   03/19/24 0430 03/19/24 0900  BP: 115/71 117/66  Pulse: 80 74  Resp: 18 18  Temp: 36.7 C 36.6 C  SpO2: 99% 100%    Last Pain:  Vitals:   03/19/24 0900  TempSrc: Oral  PainSc:    Pain Goal:                   [employer]

## 2024-03-19 NOTE — Lactation Note (Signed)
 This note was copied from a baby's chart. Lactation Consultation Note  Patient Name: Connie Fernandez ZOXWR'U Date: 03/19/2024 Age:28 hours Reason for consult: Follow-up assessment;Primapara;1st time breastfeeding;Early term 37-38.6wks (suboxone  use)  P1- MOB reports that infant has been latching since birth, but he is very tired so she started supplementing with some formula. LC reviewed the first 24 hr birthday nap and how sleepiness is normal behavior. LC then reviewed day 2 cluster feeding and what that may look like. Infant had just finished 20 mL of formula, so LC encouraged MOB to call for infant's next feeding so LC could assist. MOB has been pumping with her hands free MomCozy pump. MOB reports collecting roughly 2-5 mLs at a time. LC praised MOB for her supply. LC offered to set up the hospital DEBP for MOB to use, but she declined at this time and reported that the hands free pump works for her. LC reviewed the CDC milk storage guidelines, LC services handout and engorgement/breast care. LC encouraged MOB to call for further assistance as needed.  Maternal Data Has patient been taught Hand Expression?: Yes Does the patient have breastfeeding experience prior to this delivery?: No  Feeding Mother's Current Feeding Choice: Breast Milk and Formula Nipple Type: Slow - flow  Lactation Tools Discussed/Used Pump Education: Milk Storage  Interventions Interventions: Breast feeding basics reviewed;Education;LC Services brochure  Discharge Discharge Education: Engorgement and breast care;Warning signs for feeding baby Pump: Hands Free;Personal  Consult Status Consult Status: Follow-up Date: 03/20/24 Follow-up type: In-patient    Connie Fernandez BS, IBCLC 03/19/2024, 4:27 PM

## 2024-03-20 MED ORDER — FERROUS SULFATE 325 (65 FE) MG PO TABS: 325.0000 mg | ORAL_TABLET | ORAL | 0 refills | Status: DC

## 2024-03-20 MED ORDER — FERROUS SULFATE 325 (65 FE) MG PO TABS: 325.0000 mg | ORAL_TABLET | ORAL | 2 refills | Status: DC

## 2024-03-20 NOTE — Lactation Note (Signed)
 This note was copied from a baby's chart. Lactation Consultation Note  Patient Name: Boy Shyera Taneja ZOXWR'U Date: 03/20/2024 Age:28 hours Reason for consult: Follow-up assessment;Maternal discharge;Early term 37-38.6wks  P1, 38 wks, @ 39 hrs of life. Mom anticipates DC today. Encouraged mom milk production tied directly to breast stimulation. Encouraged offering infant breast and following up with mom cozy as desired or if baby supplementing with formula. Mom has mom cozy and Medela hand pump. Highlighted hand pump best for engorged/swollen breasts. Encouraged mom to keep working on big mouth latch with baby and use EBM or coconut oil after each feed. Discussed cluster feeding overnight/ early morning brings in our milk supply, shared expectations of milk coming in. Highlighted risk of engorgement. Discussed hand pump/express to soften breasts, motrin  as anti-inflammatory, and ice packs for 10-20 minutes post feed/pumping if still over-full is the best treatments for inflamed/engorged breasts.  Maternal Data Does the patient have breastfeeding experience prior to this delivery?: No  Feeding Mother's Current Feeding Choice: Breast Milk and Formula   Interventions Interventions: Expressed milk;Coconut oil;Hand pump;Education;LC Services brochure;CDC milk storage guidelines  Discharge Discharge Education: Engorgement and breast care Pump: Manual;Hands Free;Personal  Consult Status Consult Status: Complete Date: 03/20/24 Follow-up type: In-patient    [facility] 03/20/2024, 8:09 AM

## 2024-03-23 ENCOUNTER — Ambulatory Visit (HOSPITAL_COMMUNITY): Payer: Self-pay

## 2024-03-23 ENCOUNTER — Encounter: Payer: Self-pay | Admitting: Family Medicine

## 2024-03-23 NOTE — Lactation Note (Signed)
 This note was copied from a baby's chart. Lactation Consultation Note  Patient Name: Connie Fernandez ZOXWR'U Date: 03/23/2024 Age:28 days  Reason for consult: Follow-up assessment;Early term 37-38.6wks;Primapara;1st time breastfeeding  P1, [redacted]w[redacted]d, 8.15% weight loss, Eat,Sleep,Console   Follow up LC visit. Infant is 40 days old. Mother's milk has come in. She is pumping using her hands free pump and collecting 30 ml. She reports pumping every 3 hours. Mother states she is latching baby and denies need for assistance. Infant is tolerating formula and bottle feeding well.   Mom made aware of O/P services, breastfeeding support groups, community resources, and our phone # for post-discharge questions. Mother has met OP Lactation Consultant and was provided with information for follow up lactation assist and support. Currently, mother declined appointment with [facility].    Maternal Data  Taking Suboxone  due to maternal Kratom use  Feeding Mother's Current Feeding Choice: Breast Milk and Formula  LATCH Score  Not observed   Lactation Tools Discussed/Used  Mother has personal breast pump  Interventions  Resources for OP breastfeeding support, safe handling of breast milk, storage and thawing, and cleaning of breast pump parts.   Consult Status Consult Status: Complete Date: 03/23/24    Esperanza Hedges 03/23/2024, 9:01 AM

## 2024-03-24 ENCOUNTER — Telehealth: Admitting: Family Medicine

## 2024-03-24 NOTE — Progress Notes (Signed)
 Called patient at 1623 to begin virtual visit. Voicemail left stating I will call again in approx 10 minutes. If patient not available at that time will need to reschedule.   Called patient at 1636; VM left stating our office will reach out to reschedule appointment.  Kayleen Party RN 03/24/24

## 2024-03-24 NOTE — Progress Notes (Signed)
No show-reschedule

## 2024-03-25 ENCOUNTER — Encounter: Admitting: Family Medicine

## 2024-03-30 ENCOUNTER — Encounter: Payer: Self-pay | Admitting: Family Medicine

## 2024-03-31 ENCOUNTER — Ambulatory Visit: Admitting: Family Medicine

## 2024-03-31 ENCOUNTER — Telehealth: Payer: Self-pay | Admitting: Family Medicine

## 2024-03-31 NOTE — Telephone Encounter (Signed)
 The patient was unable to attend the appointment today at the available time slot. They requested the next available appointment, which was on June 10th, and confirmed their agreement for that date and time.

## 2024-04-01 ENCOUNTER — Telehealth (HOSPITAL_COMMUNITY): Payer: Self-pay | Admitting: *Deleted

## 2024-04-01 NOTE — Telephone Encounter (Signed)
 Attempted hospital discharge follow-up call. Left message for patient to return RN call with any questions or concerns. Julien Odor, RN, 04/01/24, [phone]

## 2024-04-02 ENCOUNTER — Encounter: Payer: Self-pay | Admitting: Family Medicine

## 2024-04-02 DIAGNOSIS — F1120 Opioid dependence, uncomplicated: Secondary | ICD-10-CM

## 2024-04-05 MED ORDER — BUPRENORPHINE HCL-NALOXONE HCL 8-2 MG SL FILM: 1.0000 | ORAL_FILM | Freq: Three times a day (TID) | SUBLINGUAL | 0 refills | Status: DC

## 2024-04-06 ENCOUNTER — Telehealth: Payer: Self-pay | Admitting: Family Medicine

## 2024-04-06 NOTE — Telephone Encounter (Signed)
 [employer]/   Patient pharmacy will not give her med and she need someone to call the pharmacy .

## 2024-04-08 NOTE — Telephone Encounter (Signed)
 Called Pharmacy to determine what the concern is with picking up her Suboxone . It was picked up yesterday.

## 2024-04-14 ENCOUNTER — Other Ambulatory Visit: Payer: Self-pay

## 2024-04-14 ENCOUNTER — Encounter: Payer: Self-pay | Admitting: Family Medicine

## 2024-04-14 ENCOUNTER — Ambulatory Visit (INDEPENDENT_AMBULATORY_CARE_PROVIDER_SITE_OTHER): Admitting: Family Medicine

## 2024-04-14 VITALS — BP 104/64 | HR 68 | Ht 62.0 in | Wt 130.2 lb

## 2024-04-14 DIAGNOSIS — F1120 Opioid dependence, uncomplicated: Secondary | ICD-10-CM

## 2024-04-14 DIAGNOSIS — F1191 Opioid use, unspecified, in remission: Secondary | ICD-10-CM

## 2024-04-14 NOTE — Progress Notes (Unsigned)
 Connie Fernandez is a 28 y.o. G1P1001 here today for OUD/postpartum follow up.  Had VAVD with me on 03/18/2024  Today returns for OUD f/u Reports she is doing well, having some issues with breastfeeding and would like to see lactation Has questions about duration of treatment***  Health Maintenance Due  Topic Date Due   Pneumococcal Vaccine 74-43 Years old (1 of 2 - PCV) Never done   DTaP/Tdap/Td (2 - Td or Tdap) 01/20/2021   COVID-19 Vaccine (1 - 2024-25 season) Never done    Past Medical History:  Diagnosis Date   Acne 02/19/2017   Acute cystitis with hematuria 07/25/2017   Anemia    Anxiety    Chronic pelvic pain in female    Had negative laparoscopy for endometriosis in 2017   Depression    Dysmenorrhea 02/19/2017   Gastritis 08/01/2021   Headache    Migraines   Heart palpitations    History of drug use 07/25/2017   History of drug use 07/25/2017   Opioid use disorder, mild, abuse (HCC) 01/19/2020   Pelvic floor dysfunction 10/05/2016   Primary dysmenorrhea 10/05/2016   Negative laparoscopy for endometriosis     Tobacco use disorder 10/28/2015    Past Surgical History:  Procedure Laterality Date   LAPAROSCOPY N/A 06/12/2016   Procedure: LAPAROSCOPY DIAGNOSTIC;  Surgeon: Ana Balling, MD;  Location: [facility];  Service: Gynecology;  Laterality: N/A;    The following portions of the patient's history were reviewed and updated as appropriate: allergies, current medications, past family history, past medical history, past social history, past surgical history and problem list.   Health Maintenance:   Last pap:  Result Date Procedure Results Follow-ups  01/23/2022 Cytology - PAP( [place]) Neisseria Gonorrhea: Negative Chlamydia: Negative Trichomonas: Negative Adequacy: Satisfactory for evaluation; transformation zone component PRESENT. Diagnosis: - Negative for Intraepithelial Lesions or Malignancy (NILM) Diagnosis: - Benign reactive/reparative  changes Comment: Normal Reference Ranger Chlamydia - Negative Comment: Normal Reference Range Neisseria Gonorrhea - Negative Comment: Normal Reference Range Trichomonas - Negative   08/13/2019 Cytology - PAP Neisseria Gonorrhea: Negative Chlamydia: Negative Trichomonas: Negative Adequacy: Satisfactory for evaluation; transformation zone component PRESENT. Diagnosis: - Negative for intraepithelial lesion or malignancy (NILM) Comment: Normal Reference [place] Chlamydia - Negative Comment: Normal Reference Range Neisseria Gonorrhea - Negative Comment: Normal Reference Range Trichomonas - Negative   12/23/2014 HM PAP SMEAR HM Pap smear: normal    ***  Last mammogram:  N/a    Hepatitis serologies: Lab Results  Component Value Date   HEPCAB NON-REACTIVE 07/25/2017    Hep A Immunization: {immunization choices:32269}  Hep B Immunization: {immunization choices:32269}  Last LFTs: Lab Results  Component Value Date   ALT 13 03/17/2024   AST 29 03/17/2024   ALKPHOS 98 03/17/2024   BILITOT 0.6 03/17/2024     Review of Systems:  Pertinent items noted in HPI and remainder of comprehensive ROS otherwise negative.  Physical Exam:  BP 104/64   Pulse 68   Ht 5\' 2"  (1.575 m)   Wt 130 lb 3.2 oz (59.1 kg)   LMP 06/18/2023 (Exact Date)   Breastfeeding No Comment: pumping  BMI 23.81 kg/m  CONSTITUTIONAL: Well-developed, well-nourished female in no acute distress.  HEENT:  Normocephalic, atraumatic. External right and left ear normal. No scleral icterus.  NECK: Normal range of motion, supple, no masses noted on observation SKIN: No rash noted. Not diaphoretic. No erythema. No pallor. MUSCULOSKELETAL: Normal range of motion. No edema noted. NEUROLOGIC: Alert and oriented  to person, place, and time. Normal muscle tone coordination.  PSYCHIATRIC: Normal mood and affect. Normal behavior. Normal judgment and thought content. RESPIRATORY: Effort normal, no problems with respiration  noted ABDOMEN: No masses noted. No other overt distention noted.  *** PELVIC: {Blank single:19197::"Deferred","Normal appearing external genitalia; normal appearing vaginal mucosa and cervix.  No abnormal discharge noted.  Normal uterine size, no other palpable masses, no uterine or adnexal tenderness."}  Labs and Imaging I have reviewed the PDMP during this encounter.    Last UDS: Lab Results  Component Value Date   CREATIUR 91 03/10/2024      Assessment and Plan:   Problem List Items Addressed This Visit       Other   Suboxone  maintenance treatment complicating pregnancy, antepartum, third trimester (HCC) - Primary   Relevant Orders   ToxAssure Flex 15, Ur   Vacuum-assisted vaginal delivery      Return in about 4 weeks (around 05/12/2024) for REACH clinic, OUD f/u.    Total face-to-face time with patient: {Blank single:19197::"10","15","20","25","30"} minutes.  Over 50% of encounter was spent on counseling and coordination of care.  Future Appointments  Date Time Provider Department Center  04/28/2024  3:50 PM Granville Layer, MD [facility]    Teena Feast, MD/MPH Attending Family Medicine Physician, [facility], [facility]

## 2024-04-14 NOTE — Progress Notes (Unsigned)
 LC to room per MD request due to lactating parent wanting to boost her milk supply. LC encouraged parent to pump consistently, power pump,and making sure that she is fully hydrated and getting appropriate nutrition. Offered to measure nipples for flange size and patient declined. Parents goal is to relactate after stopping breastfeeding for a week.

## 2024-04-15 NOTE — Assessment & Plan Note (Signed)
 Doing well, stable on 8 TID of buprenorphine . UDS with verbal consent today. Discussed follow up frequency, prefers monthly for now. After visit noted to still need hep A and B serologies, she is up to date on CMP (last done 03/2024).

## 2024-04-19 LAB — TOXASSURE FLEX 15, UR
6-ACETYLMORPHINE IA: NEGATIVE ng/mL
7-aminoclonazepam: NOT DETECTED ng/mg{creat}
AMPHETAMINES IA: NEGATIVE ng/mL
Alpha-hydroxyalprazolam: NOT DETECTED ng/mg{creat}
Alpha-hydroxymidazolam: NOT DETECTED ng/mg{creat}
Alpha-hydroxytriazolam: NOT DETECTED ng/mg{creat}
Alprazolam: NOT DETECTED ng/mg{creat}
BARBITURATES IA: NEGATIVE ng/mL
BUPRENORPHINE: POSITIVE
Benzodiazepines: NEGATIVE
Buprenorphine: 455 ng/mg{creat}
CANNABINOIDS IA: NEGATIVE ng/mL
COCAINE METABOLITE IA: NEGATIVE ng/mL
Clonazepam: NOT DETECTED ng/mg{creat}
Creatinine: 177 mg/dL (ref >=20)
Desalkylflurazepam: NOT DETECTED ng/mg{creat}
Desmethyldiazepam: NOT DETECTED ng/mg{creat}
Desmethylflunitrazepam: NOT DETECTED ng/mg{creat}
Diazepam: NOT DETECTED ng/mg{creat}
ETHYL ALCOHOL Enzymatic: NEGATIVE g/dL
FENTANYL: NEGATIVE
Fentanyl: NOT DETECTED ng/mg{creat}
Flunitrazepam: NOT DETECTED ng/mg{creat}
Lorazepam: NOT DETECTED ng/mg{creat}
METHADONE IA: NEGATIVE ng/mL
METHADONE MTB IA: NEGATIVE ng/mL
Midazolam: NOT DETECTED ng/mg{creat}
Norbuprenorphine: >565 ng/mg{creat}
Norfentanyl: NOT DETECTED ng/mg{creat}
OPIATE CLASS IA: NEGATIVE ng/mL
OXYCODONE CLASS IA: NEGATIVE ng/mL
Oxazepam: NOT DETECTED ng/mg{creat}
PHENCYCLIDINE IA: NEGATIVE ng/mL
TAPENTADOL, IA: NEGATIVE ng/mL
TRAMADOL IA: NEGATIVE ng/mL
Temazepam: NOT DETECTED ng/mg{creat}

## 2024-04-20 ENCOUNTER — Ambulatory Visit: Payer: Self-pay | Admitting: Family Medicine

## 2024-04-28 ENCOUNTER — Encounter: Payer: Self-pay | Admitting: Family Medicine

## 2024-04-28 ENCOUNTER — Ambulatory Visit: Admitting: Family Medicine

## 2024-04-28 NOTE — Progress Notes (Signed)
Patient did not keep appointment today. She will be called to reschedule.  

## 2024-05-04 ENCOUNTER — Encounter: Payer: Self-pay | Admitting: Family Medicine

## 2024-05-04 ENCOUNTER — Other Ambulatory Visit: Payer: Self-pay | Admitting: Family Medicine

## 2024-05-04 DIAGNOSIS — F1120 Opioid dependence, uncomplicated: Secondary | ICD-10-CM

## 2024-05-04 MED ORDER — BUPRENORPHINE HCL-NALOXONE HCL 8-2 MG SL FILM: 1.0000 | ORAL_FILM | Freq: Three times a day (TID) | SUBLINGUAL | 0 refills | Status: DC

## 2024-05-04 NOTE — Telephone Encounter (Signed)
 [place] reviewed

## 2024-05-26 ENCOUNTER — Ambulatory Visit: Admitting: Family Medicine

## 2024-05-26 ENCOUNTER — Encounter: Payer: Self-pay | Admitting: Family Medicine

## 2024-05-26 ENCOUNTER — Other Ambulatory Visit: Payer: Self-pay

## 2024-05-26 DIAGNOSIS — Z2839 Other underimmunization status: Secondary | ICD-10-CM | POA: Diagnosis not present

## 2024-05-26 DIAGNOSIS — Z30011 Encounter for initial prescription of contraceptive pills: Secondary | ICD-10-CM

## 2024-05-26 DIAGNOSIS — F1191 Opioid use, unspecified, in remission: Secondary | ICD-10-CM

## 2024-05-26 DIAGNOSIS — F1120 Opioid dependence, uncomplicated: Secondary | ICD-10-CM

## 2024-05-26 MED ORDER — NEXTSTELLIS 3-14.2 MG PO TABS: 1.0000 | ORAL_TABLET | Freq: Every day | ORAL | 3 refills | Status: AC

## 2024-05-26 MED ORDER — BUPRENORPHINE HCL-NALOXONE HCL 8-2 MG SL FILM: 1.0000 | ORAL_FILM | Freq: Three times a day (TID) | SUBLINGUAL | 0 refills | Status: AC

## 2024-05-26 NOTE — Patient Instructions (Signed)

## 2024-05-26 NOTE — Progress Notes (Signed)
 Post Partum Visit Note  Connie Fernandez is a 28 y.o. G36P1001 female who presents for a postpartum visit. She is 9 weeks 6 days postpartum following a vacuum-assisted vaginal delivery.  I have fully reviewed the prenatal and intrapartum course. The delivery was at [redacted]w[redacted]d.  Anesthesia: epidural. Postpartum course has been uneventful. Baby is doing well. Baby is feeding by both breast and bottle - Similac Total Comfort. Bleeding no bleeding. Bowel function is normal. Bladder function is normal. Patient is sexually active. Contraception method is none. Postpartum depression screening: negative.   Upstream - 05/26/24 1606       Pregnancy Intention Screening   Does the patient want to become pregnant in the next year? No    Does the patient's partner want to become pregnant in the next year? No    Would the patient like to discuss contraceptive options today? No      Contraception Wrap Up   Current Method No Method - Other Reason   recent pregnancy        The pregnancy intention screening data noted above was reviewed. Potential methods of contraception were discussed. The patient elected to proceed with No data recorded.   Edinburgh Postnatal Depression Scale - 05/26/24 1608       Edinburgh Postnatal Depression Scale:  In the Past 7 Days   I have been able to laugh and see the funny side of things. 0    I have looked forward with enjoyment to things. 0    I have blamed myself unnecessarily when things went wrong. 0    I have been anxious or worried for no good reason. 2    I have felt scared or panicky for no good reason. 0    Things have been getting on top of me. 0    I have been so unhappy that I have had difficulty sleeping. 0    I have felt sad or miserable. 0    I have been so unhappy that I have been crying. 0    The thought of harming myself has occurred to me. 0    Edinburgh Postnatal Depression Scale Total 2          Health Maintenance Due  Topic Date Due    Pneumococcal Vaccine 9-75 Years old (1 of 2 - PCV) Never done   Hepatitis B Vaccines (1 of 3 - 19+ 3-dose series) Never done   DTaP/Tdap/Td (2 - Td or Tdap) 01/20/2021   HPV VACCINES (1 - 3-dose SCDM series) Never done   COVID-19 Vaccine (1 - 2024-25 season) Never done    The following portions of the patient's history were reviewed and updated as appropriate: allergies, current medications, past family history, past medical history, past social history, past surgical history, and problem list.  Review of Systems Pertinent items noted in HPI and remainder of comprehensive ROS otherwise negative.  Objective:  BP 107/71   Pulse 71   LMP 06/18/2023 (Exact Date)   Breastfeeding Yes    General:  alert, cooperative, and appears stated age   Breasts:  not indicated  Lungs: Comfortalbe on room air  Wound N/a  GU exam:  not indicated        PDMP:   Last UDS:    Assessment:   Postpartum exam  Opioid use disorder in remission - Plan: ToxAssure Flex 15, Ur  Vacuum-assisted vaginal delivery  Rubella non-immune status, antepartum  Encounter for initial prescription of contraceptive pills - Plan: Drospirenone-Estetrol (  NEXTSTELLIS ) 3-14.2 MG TABS  Suboxone  maintenance treatment complicating pregnancy, antepartum, third trimester (HCC) - Plan: Buprenorphine  HCl-Naloxone  HCl (SUBOXONE ) 8-2 MG FILM  Normal postpartum exam.   Plan:   Essential components of care per ACOG recommendations:  1.  Mood and well being: Patient with negative depression screening today. Reviewed local resources for support.  - Patient tobacco use? No.   - hx of drug use? Yes, see below    2. Infant care and feeding:  -Patient currently breastmilk feeding? Yes. Reviewed importance of draining breast regularly to support lactation.  -Social determinants of health (SDOH) reviewed in EPIC. No concerns  3. Sexuality, contraception and birth spacing - Patient does not want a pregnancy in the next year.   Desired family size is 1 children.  - Reviewed reproductive life planning. Reviewed contraceptive methods based on pt preferences and effectiveness.  Patient desired OCPs today We discussed this may cause her milk supply to decrease or stop completely.  Last had unprotected sex last night, LMP around beginning of th emonth. Discussed ok to start but needs to take UPT if she doesn't get a period in two weeks.  - Discussed birth spacing of 18 months  4. Sleep and fatigue -Encouraged family/partner/community support of 4 hrs of uninterrupted sleep to help with mood and fatigue  5. Physical Recovery  - Discussed patients delivery and complications. She describes her labor as mixed. - Patient had a Vaginal, no problems at delivery. Patient had no significant laceration. Perineal healing reviewed. Patient expressed understanding - Patient has urinary incontinence? No. - Patient is safe to resume physical and sexual activity  6.  Health Maintenance - HM due items addressed No - up to date - Last pap smear  Diagnosis  Date Value Ref Range Status  01/23/2022   Final   - Negative for Intraepithelial Lesions or Malignancy (NILM)  01/23/2022 - Benign reactive/reparative changes  Final   Pap smear not done at today's visit.  -Breast Cancer screening indicated? No.   7. Chronic Disease/Pregnancy Condition follow up:  1. Postpartum exam See above Would also like to extend maternity leave, letter provided  2. Opioid use disorder in remission Doing well Stable on suboxone  Refill sent UDS today  3. Vacuum-assisted vaginal delivery    - PCP follow up   Donnice CHRISTELLA Carolus, MD/MPH Attending Family Medicine Physician, [facility] for [facility], [facility]

## 2024-05-30 LAB — TOXASSURE FLEX 15, UR
6-ACETYLMORPHINE IA: NEGATIVE ng/mL
7-aminoclonazepam: NOT DETECTED ng/mg{creat}
AMPHETAMINES IA: NEGATIVE ng/mL
Alpha-hydroxyalprazolam: NOT DETECTED ng/mg{creat}
Alpha-hydroxymidazolam: NOT DETECTED ng/mg{creat}
Alpha-hydroxytriazolam: NOT DETECTED ng/mg{creat}
Alprazolam: NOT DETECTED ng/mg{creat}
BARBITURATES IA: NEGATIVE ng/mL
BUPRENORPHINE: POSITIVE
Benzodiazepines: NEGATIVE
Buprenorphine: 424 ng/mg{creat}
CANNABINOIDS IA: NEGATIVE ng/mL
COCAINE METABOLITE IA: NEGATIVE ng/mL
Clonazepam: NOT DETECTED ng/mg{creat}
Creatinine: 114 mg/dL (ref >=20)
Desalkylflurazepam: NOT DETECTED ng/mg{creat}
Desmethyldiazepam: NOT DETECTED ng/mg{creat}
Desmethylflunitrazepam: NOT DETECTED ng/mg{creat}
Diazepam: NOT DETECTED ng/mg{creat}
ETHYL ALCOHOL Enzymatic: NEGATIVE g/dL
FENTANYL: NEGATIVE
Fentanyl: NOT DETECTED ng/mg{creat}
Flunitrazepam: NOT DETECTED ng/mg{creat}
Lorazepam: NOT DETECTED ng/mg{creat}
METHADONE IA: NEGATIVE ng/mL
METHADONE MTB IA: NEGATIVE ng/mL
Midazolam: NOT DETECTED ng/mg{creat}
Norbuprenorphine: >877 ng/mg{creat}
Norfentanyl: NOT DETECTED ng/mg{creat}
OPIATE CLASS IA: NEGATIVE ng/mL
OXYCODONE CLASS IA: NEGATIVE ng/mL
Oxazepam: NOT DETECTED ng/mg{creat}
PHENCYCLIDINE IA: NEGATIVE ng/mL
TAPENTADOL, IA: NEGATIVE ng/mL
TRAMADOL IA: NEGATIVE ng/mL
Temazepam: NOT DETECTED ng/mg{creat}

## 2024-06-01 ENCOUNTER — Ambulatory Visit: Payer: Self-pay | Admitting: Family Medicine

## 2024-06-03 ENCOUNTER — Encounter: Payer: Self-pay | Admitting: Family Medicine

## 2024-07-07 ENCOUNTER — Other Ambulatory Visit: Payer: Self-pay

## 2024-07-07 ENCOUNTER — Ambulatory Visit: Admitting: Advanced Practice Midwife

## 2024-07-07 VITALS — BP 103/55 | HR 78 | Wt 132.0 lb

## 2024-07-07 DIAGNOSIS — F1191 Opioid use, unspecified, in remission: Secondary | ICD-10-CM

## 2024-07-07 DIAGNOSIS — Z5181 Encounter for therapeutic drug level monitoring: Secondary | ICD-10-CM

## 2024-07-07 DIAGNOSIS — D62 Acute posthemorrhagic anemia: Secondary | ICD-10-CM | POA: Diagnosis not present

## 2024-07-07 DIAGNOSIS — Z79891 Long term (current) use of opiate analgesic: Secondary | ICD-10-CM | POA: Diagnosis not present

## 2024-07-07 MED ORDER — BUPRENORPHINE HCL-NALOXONE HCL 8-2 MG SL FILM: 1.0000 | ORAL_FILM | Freq: Three times a day (TID) | SUBLINGUAL | 0 refills | Status: DC

## 2024-07-07 NOTE — Progress Notes (Signed)
 Connie Fernandez is a 28 y.o. G1P1001 here today for Suboxone  management. 4 months postpartum. Stable on Suboxone  8-2 TID. Denies cravings or withdrawal Sx.   Reports itching on legs accompanied by bruising. Concerned about anemia. Hgb 8.4 after delivery. Not tolerating oral iron .    Health Maintenance Due  Topic Date Due   Pneumococcal Vaccine (1 of 2 - PCV) Never done   Hepatitis B Vaccines 19-59 Average Risk (1 of 3 - 19+ 3-dose series) Never done   DTaP/Tdap/Td (2 - Td or Tdap) 01/20/2021   HPV VACCINES (1 - 3-dose SCDM series) Never done   INFLUENZA VACCINE  06/05/2024   COVID-19 Vaccine (1 - 2024-25 season) Never done    Past Medical History:  Diagnosis Date   Acne 02/19/2017   Acute cystitis with hematuria 07/25/2017   Anemia    Anxiety    Chronic pelvic pain in female    Had negative laparoscopy for endometriosis in 2017   Depression    Dysmenorrhea 02/19/2017   Gastritis 08/01/2021   Headache    Migraines   Heart palpitations    History of drug use 07/25/2017   History of drug use 07/25/2017   Opioid use disorder, mild, abuse (HCC) 01/19/2020   Pelvic floor dysfunction 10/05/2016   Primary dysmenorrhea 10/05/2016   Negative laparoscopy for endometriosis     Tobacco use disorder 10/28/2015    Past Surgical History:  Procedure Laterality Date   LAPAROSCOPY N/A 06/12/2016   Procedure: LAPAROSCOPY DIAGNOSTIC;  Surgeon: Harland JAYSON Birkenhead, MD;  Location: [facility];  Service: Gynecology;  Laterality: N/A;    The following portions of the patient's history were reviewed and updated as appropriate: allergies, current medications, past family history, past medical history, past social history, past surgical history and problem list.   Health Maintenance:   Last pap:  Result Date Procedure Results Follow-ups  01/23/2022 Cytology - PAP( [place]) Neisseria Gonorrhea: Negative Chlamydia: Negative Trichomonas: Negative Adequacy: Satisfactory for evaluation;  transformation zone component PRESENT. Diagnosis: - Negative for Intraepithelial Lesions or Malignancy (NILM) Diagnosis: - Benign reactive/reparative changes Comment: Normal Reference Ranger Chlamydia - Negative Comment: Normal Reference Range Neisseria Gonorrhea - Negative Comment: Normal Reference Range Trichomonas - Negative   08/13/2019 Cytology - PAP Neisseria Gonorrhea: Negative Chlamydia: Negative Trichomonas: Negative Adequacy: Satisfactory for evaluation; transformation zone component PRESENT. Diagnosis: - Negative for intraepithelial lesion or malignancy (NILM) Comment: Normal Reference Ranger Chlamydia - Negative Comment: Normal Reference Range Neisseria Gonorrhea - Negative Comment: Normal Reference Range Trichomonas - Negative   12/23/2014 HM PAP SMEAR HM Pap smear: normal     Hepatitis serologies: Lab Results  Component Value Date   HEPCAB NON-REACTIVE 07/25/2017    Hep A Immunization: deferred to future visit  Hep B Immunization: deferred to future visit  Last LFTs: Lab Results  Component Value Date   ALT 13 03/17/2024   AST 29 03/17/2024   ALKPHOS 98 03/17/2024   BILITOT 0.6 03/17/2024     Review of Systems:  Pertinent items noted in HPI and remainder of comprehensive ROS otherwise negative.  Physical Exam:  BP (!) 103/55   Pulse 78   Wt 132 lb (59.9 kg)   BMI 24.14 kg/m  CONSTITUTIONAL: Well-developed, well-nourished female in no acute distress.  HEENT:  Normocephalic, atraumatic. External right and left ear normal. No scleral icterus.  SKIN: No rash noted. Not diaphoretic. No erythema. No pallor. MUSCULOSKELETAL: Normal range of motion. No edema noted. NEUROLOGIC: Alert and oriented to person, place, and  time. Normal muscle tone coordination.  PSYCHIATRIC: Normal mood and affect. Normal behavior. Normal judgment and thought content. RESPIRATORY: Effort normal, no problems with respiration noted ABDOMEN: NA PELVIC: Deferred  Labs and Imaging I  have reviewed the PDMP during this encounter.   Last UDS: Lab Results  Component Value Date   CREATIUR 114 05/26/2024       Assessment and Plan:  1. Opioid use disorder in remission (Primary) - ToxAssure Flex 15, Ur - Anemia Profile B - Hepatitis B surface antigen - Hepatitis A Ab, Total - Buprenorphine  HCl-Naloxone  HCl (SUBOXONE ) 8-2 MG FILM; Place 1 Film under the tongue in the morning, at noon, and at bedtime.  Dispense: 30 each; Refill: 0  2. Encounter for monitoring Suboxone  maintenance therapy - ToxAssure Flex 15, Ur - Hepatitis B surface antigen - Hepatitis A Ab, Total - Buprenorphine  HCl-Naloxone  HCl (SUBOXONE ) 8-2 MG FILM; Place 1 Film under the tongue in the morning, at noon, and at bedtime.  Dispense: 30 each; Refill: 0  3. Anemia associated with acute blood loss - Anemia Profile B   No follow-ups on file.   No future appointments.  Total face-to-face time with patient: 20 minutes.  Over 50% of encounter was spent on counseling and coordination of care.   Monika Chestang  Claudene HOWARD 07/07/2024 4:06 PM Center for [employer] [facility], [facility]

## 2024-07-08 LAB — ANEMIA PROFILE B
Basophils Absolute: 0.1 x10E3/uL (ref 0.0–0.2)
Basos: 2 %
EOS (ABSOLUTE): 0 x10E3/uL (ref 0.0–0.4)
Eos: 0 %
Ferritin: 13 ng/mL — ABNORMAL LOW (ref 15–150)
Folate: 14.3 ng/mL (ref >3.0)
Hematocrit: 36.9 % (ref 34.0–46.6)
Hemoglobin: 12 g/dL (ref 11.1–15.9)
Immature Grans (Abs): 0 x10E3/uL (ref 0.0–0.1)
Immature Granulocytes: 0 %
Iron Saturation: 19 % (ref 15–55)
Iron: 60 ug/dL (ref 27–159)
Lymphocytes Absolute: 1.8 x10E3/uL (ref 0.7–3.1)
Lymphs: 37 %
MCH: 28 pg (ref 26.6–33.0)
MCHC: 32.5 g/dL (ref 31.5–35.7)
MCV: 86 fL (ref 79–97)
Monocytes Absolute: 0.4 x10E3/uL (ref 0.1–0.9)
Monocytes: 8 %
Neutrophils Absolute: 2.6 x10E3/uL (ref 1.4–7.0)
Neutrophils: 53 %
Platelets: 233 x10E3/uL (ref 150–450)
RBC: 4.28 x10E6/uL (ref 3.77–5.28)
RDW: 14.8 % (ref 11.7–15.4)
Retic Ct Pct: 1 % (ref 0.6–2.6)
Total Iron Binding Capacity: 318 ug/dL (ref 250–450)
UIBC: 258 ug/dL (ref 131–425)
Vitamin B-12: 463 pg/mL (ref 232–1245)
WBC: 4.9 x10E3/uL (ref 3.4–10.8)

## 2024-07-08 LAB — HEPATITIS B SURFACE ANTIGEN: Hepatitis B Surface Ag: NEGATIVE

## 2024-07-08 LAB — HEPATITIS A ANTIBODY, TOTAL: hep A Total Ab: NEGATIVE

## 2024-07-09 ENCOUNTER — Encounter: Payer: Self-pay | Admitting: Advanced Practice Midwife

## 2024-07-11 LAB — TOXASSURE FLEX 15, UR
6-ACETYLMORPHINE IA: NEGATIVE ng/mL
7-aminoclonazepam: NOT DETECTED ng/mg{creat}
AMPHETAMINES IA: NEGATIVE ng/mL
Alpha-hydroxyalprazolam: NOT DETECTED ng/mg{creat}
Alpha-hydroxymidazolam: NOT DETECTED ng/mg{creat}
Alpha-hydroxytriazolam: NOT DETECTED ng/mg{creat}
Alprazolam: NOT DETECTED ng/mg{creat}
BARBITURATES IA: NEGATIVE ng/mL
BUPRENORPHINE: POSITIVE
Benzodiazepines: NEGATIVE
Buprenorphine: 232 ng/mg{creat}
CANNABINOIDS IA: NEGATIVE ng/mL
COCAINE METABOLITE IA: NEGATIVE ng/mL
Clonazepam: NOT DETECTED ng/mg{creat}
Creatinine: 145 mg/dL (ref >=20)
Desalkylflurazepam: NOT DETECTED ng/mg{creat}
Desmethyldiazepam: NOT DETECTED ng/mg{creat}
Desmethylflunitrazepam: NOT DETECTED ng/mg{creat}
Diazepam: NOT DETECTED ng/mg{creat}
ETHYL ALCOHOL Enzymatic: NEGATIVE g/dL
FENTANYL: NEGATIVE
Fentanyl: NOT DETECTED ng/mg{creat}
Flunitrazepam: NOT DETECTED ng/mg{creat}
Lorazepam: NOT DETECTED ng/mg{creat}
METHADONE IA: NEGATIVE ng/mL
METHADONE MTB IA: NEGATIVE ng/mL
Midazolam: NOT DETECTED ng/mg{creat}
Norbuprenorphine: >690 ng/mg{creat}
Norfentanyl: NOT DETECTED ng/mg{creat}
OPIATE CLASS IA: NEGATIVE ng/mL
OXYCODONE CLASS IA: NEGATIVE ng/mL
Oxazepam: NOT DETECTED ng/mg{creat}
PHENCYCLIDINE IA: NEGATIVE ng/mL
TAPENTADOL, IA: NEGATIVE ng/mL
TRAMADOL IA: NEGATIVE ng/mL
Temazepam: NOT DETECTED ng/mg{creat}

## 2024-07-13 ENCOUNTER — Encounter: Payer: Self-pay | Admitting: Advanced Practice Midwife

## 2024-07-13 DIAGNOSIS — Z79891 Long term (current) use of opiate analgesic: Secondary | ICD-10-CM | POA: Insufficient documentation

## 2024-07-14 ENCOUNTER — Other Ambulatory Visit: Payer: Self-pay | Admitting: Advanced Practice Midwife

## 2024-07-14 ENCOUNTER — Telehealth: Payer: Self-pay | Admitting: Family Medicine

## 2024-07-14 DIAGNOSIS — F1191 Opioid use, unspecified, in remission: Secondary | ICD-10-CM

## 2024-07-14 DIAGNOSIS — Z79891 Long term (current) use of opiate analgesic: Secondary | ICD-10-CM

## 2024-07-14 MED ORDER — BUPRENORPHINE HCL-NALOXONE HCL 8-2 MG SL FILM: 1.0000 | ORAL_FILM | Freq: Three times a day (TID) | SUBLINGUAL | 0 refills | Status: DC

## 2024-07-14 NOTE — Telephone Encounter (Signed)
 Patient called us  today to inform us  that she sent a MyChart message and has been waiting for a response. She picked up her prescription but realized that it was only sent for 30 pills and she takes it 3 times a day so it's only enough for 10 days. Patient wants to know if the prescription can be sent for Quantity of 90 pills so it can last longer.

## 2024-07-15 NOTE — Telephone Encounter (Signed)
 Connie  Fernandez, CNM changed order and spoke with pt.    [place]

## 2024-07-17 ENCOUNTER — Encounter: Payer: Self-pay | Admitting: Advanced Practice Midwife

## 2024-07-17 ENCOUNTER — Ambulatory Visit: Payer: Self-pay | Admitting: Advanced Practice Midwife

## 2024-07-17 ENCOUNTER — Other Ambulatory Visit: Payer: Self-pay | Admitting: Advanced Practice Midwife

## 2024-07-17 ENCOUNTER — Encounter: Payer: Self-pay | Admitting: Family Medicine

## 2024-07-17 DIAGNOSIS — D508 Other iron deficiency anemias: Secondary | ICD-10-CM

## 2024-07-17 HISTORY — DX: Other iron deficiency anemias: D50.8

## 2024-07-20 ENCOUNTER — Telehealth: Payer: Self-pay

## 2024-07-20 NOTE — Telephone Encounter (Signed)
 [place] , patient will be scheduled as soon as possible.  Auth Submission: NO AUTH NEEDED Site of care: Site of care: [facility] Payer: [employer] [place] Medication & CPT/J Code(s) submitted: Venofer (Iron  Sucrose) J1756 Diagnosis Code:  Route of submission (phone, fax, portal):  Phone # Fax # Auth type: Buy/Bill PB Units/visits requested: 200mg  x 5 doses Reference number:  Approval from: 07/20/24 to 10/19/24

## 2024-07-30 ENCOUNTER — Ambulatory Visit: Admitting: *Deleted

## 2024-07-30 VITALS — BP 108/69 | HR 78 | Temp 97.6°F | Resp 16 | Ht 62.0 in | Wt 131.4 lb

## 2024-07-30 DIAGNOSIS — D509 Iron deficiency anemia, unspecified: Secondary | ICD-10-CM

## 2024-07-30 DIAGNOSIS — D508 Other iron deficiency anemias: Secondary | ICD-10-CM

## 2024-07-30 MED ORDER — IRON SUCROSE 20 MG/ML IV SOLN
200.0000 mg | Freq: Once | INTRAVENOUS | Status: AC
  Administered 2024-07-30: 200 mg via INTRAVENOUS
  Filled 2024-07-30: qty 10

## 2024-07-30 MED ORDER — SODIUM CHLORIDE 0.9 % IV BOLUS (SEPSIS)
250.0000 mL | Freq: Once | INTRAVENOUS | Status: DC
  Filled 2024-07-30: qty 250

## 2024-07-30 NOTE — Progress Notes (Signed)
 Diagnosis: Iron  Deficiency Anemia  Provider:  Mannam, Praveen MD  Procedure: IV Push  IV Type: Peripheral, IV Location: R Hand  Venofer  (Iron  Sucrose), Dose: 200 mg  Post Infusion IV Care: Observation period completed and Peripheral IV Discontinued  Discharge: Condition: Good, Destination: Home . AVS Provided  Performed by:  Luciano Cinquemani E, RN

## 2024-08-04 ENCOUNTER — Encounter: Payer: Self-pay | Admitting: Advanced Practice Midwife

## 2024-08-04 ENCOUNTER — Other Ambulatory Visit: Payer: Self-pay

## 2024-08-04 ENCOUNTER — Ambulatory Visit (INDEPENDENT_AMBULATORY_CARE_PROVIDER_SITE_OTHER): Admitting: Advanced Practice Midwife

## 2024-08-04 VITALS — BP 117/72 | HR 76 | Wt 132.0 lb

## 2024-08-04 DIAGNOSIS — Z3009 Encounter for other general counseling and advice on contraception: Secondary | ICD-10-CM

## 2024-08-04 DIAGNOSIS — Z79891 Long term (current) use of opiate analgesic: Secondary | ICD-10-CM | POA: Diagnosis not present

## 2024-08-04 DIAGNOSIS — Z3202 Encounter for pregnancy test, result negative: Secondary | ICD-10-CM

## 2024-08-04 DIAGNOSIS — F1191 Opioid use, unspecified, in remission: Secondary | ICD-10-CM

## 2024-08-04 DIAGNOSIS — Z7251 High risk heterosexual behavior: Secondary | ICD-10-CM

## 2024-08-04 DIAGNOSIS — Z5181 Encounter for therapeutic drug level monitoring: Secondary | ICD-10-CM | POA: Diagnosis not present

## 2024-08-04 LAB — POCT PREGNANCY, URINE: Preg Test, Ur: NEGATIVE

## 2024-08-04 MED ORDER — BUPRENORPHINE HCL-NALOXONE HCL 8-2 MG SL FILM: 1.0000 | ORAL_FILM | Freq: Three times a day (TID) | SUBLINGUAL | 0 refills | Status: AC

## 2024-08-04 NOTE — Patient Instructions (Signed)
 I recommend getting Hepatitis A vaccine at your primary care provider's office or the health department.

## 2024-08-04 NOTE — Progress Notes (Signed)
 Connie Fernandez is a 28 y.o. G1P1001 here today for Suboxone  maintenance. No withdrawals or cravings on 8-2 TID. Has not started OCPs. Wants to know when to start. Has had unprotected intercourse as recently a few days ago. Patient's last menstrual period was 07/04/2024 (exact date).   Needs testing for Hep B immunity. Hep A non-immune.  Health Maintenance Due  Topic Date Due   Pneumococcal Vaccine (1 of 2 - PCV) Never done   Hepatitis B Vaccines 19-59 Average Risk (1 of 3 - 19+ 3-dose series) Never done   DTaP/Tdap/Td (2 - Td or Tdap) 01/20/2021   HPV VACCINES (1 - 3-dose SCDM series) Never done   Influenza Vaccine  06/05/2024   COVID-19 Vaccine (1 - 2024-25 season) Never done    Past Medical History:  Diagnosis Date   Acne 02/19/2017   Acute cystitis with hematuria 07/25/2017   Anemia    Anxiety    Chronic pelvic pain in female    Had negative laparoscopy for endometriosis in 2017   Depression    Dysmenorrhea 02/19/2017   Encounter for monitoring Suboxone  maintenance therapy 07/13/2024   Gastritis 08/01/2021   Headache    Migraines   Heart palpitations    History of drug use 07/25/2017   History of drug use 07/25/2017   Iron  deficiency anemia 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 07/17/2024   Opioid use disorder, mild, abuse (HCC) 01/19/2020   Pelvic floor dysfunction 10/05/2016   Primary dysmenorrhea 10/05/2016   Negative laparoscopy for endometriosis     Tobacco use disorder 10/28/2015    Past Surgical History:  Procedure Laterality Date   LAPAROSCOPY N/A 06/12/2016   Procedure: LAPAROSCOPY DIAGNOSTIC;  Surgeon: Harland JAYSON Birkenhead, MD;  Location: [facility];  Service: Gynecology;  Laterality: N/A;    The following portions of the patient's history were reviewed and updated as appropriate: allergies, current medications, past family history, past medical history, past social history, past surgical history and problem list.   Health Maintenance:   Last pap:  Result Date Procedure  Results Follow-ups  01/23/2022 Cytology - PAP( [place]) Neisseria Gonorrhea: Negative Chlamydia: Negative Trichomonas: Negative Adequacy: Satisfactory for evaluation; transformation zone component PRESENT. Diagnosis: - Negative for Intraepithelial Lesions or Malignancy (NILM) Diagnosis: - Benign reactive/reparative changes Comment: Normal Reference Ranger Chlamydia - Negative Comment: Normal Reference Range Neisseria Gonorrhea - Negative Comment: Normal Reference Range Trichomonas - Negative   08/13/2019 Cytology - PAP Neisseria Gonorrhea: Negative Chlamydia: Negative Trichomonas: Negative Adequacy: Satisfactory for evaluation; transformation zone component PRESENT. Diagnosis: - Negative for intraepithelial lesion or malignancy (NILM) Comment: Normal Reference Ranger Chlamydia - Negative Comment: Normal Reference Range Neisseria Gonorrhea - Negative Comment: Normal Reference Range Trichomonas - Negative   12/23/2014 HM PAP SMEAR HM Pap smear: normal      Hepatitis serologies: Lab Results  Component Value Date   HAV Negative 07/07/2024   HEPCAB NON-REACTIVE 07/25/2017    Hep A Immunization: recommended    Last LFTs: Lab Results  Component Value Date   ALT 13 03/17/2024   AST 29 03/17/2024   ALKPHOS 98 03/17/2024   BILITOT 0.6 03/17/2024     Review of Systems:  Pertinent items noted in HPI and remainder of comprehensive ROS otherwise negative.  Physical Exam:  BP 117/72   Pulse 76   Wt 132 lb (59.9 kg)   LMP 07/04/2024 (Exact Date)   Breastfeeding No   BMI 24.14 kg/m  CONSTITUTIONAL: Well-developed, well-nourished female in no acute distress.  HEENT:  Normocephalic, atraumatic.  External right and left ear normal. No scleral icterus.  SKIN: No rash noted. Not diaphoretic. No erythema. No pallor. MUSCULOSKELETAL: Normal range of motion. No edema noted. NEUROLOGIC: Alert and oriented to person, place, and time. Normal muscle tone coordination.  PSYCHIATRIC:  Normal mood and affect. Normal behavior. Normal judgment and thought content. RESPIRATORY: Effort normal, no problems with respiration noted ABDOMEN: Deferred PELVIC: Deferred  Labs and Imaging I have reviewed the PDMP during this encounter.   Last UDS: Lab Results  Component Value Date   CREATIUR 145 07/07/2024    Results for orders placed or performed in visit on 08/04/24 (from the past week)  Pregnancy, urine POC   Collection Time: 08/04/24  4:27 PM  Result Value Ref Range   Preg Test, Ur NEGATIVE NEGATIVE   No results found.      Assessment and Plan:  1. Opioid use disorder in remission (Primary) - ToxAssure Flex 15, Ur - Hepatitis B surface antibody,qualitative - Buprenorphine  HCl-Naloxone  HCl (SUBOXONE ) 8-2 MG FILM; Place 1 Film under the tongue in the morning, at noon, and at bedtime.  Dispense: 90 Film; Refill: 0  2. Encounter for monitoring Suboxone  maintenance therapy-- Stable on current dose.  - ToxAssure Flex 15, Ur - Buprenorphine  HCl-Naloxone  HCl (SUBOXONE ) 8-2 MG FILM; Place 1 Film under the tongue in the morning, at noon, and at bedtime.  Dispense: 90 Film; Refill: 0  3. Unprotected sexual intercourse - UPT neg. Discussed that we cannot be reasonably certain that she is not pregnant. No now risk to OCPs in early pregnancy. Offered repeat home UPT 2 weeks then start OCPs vs starting now (still repeat UPT in 2 weeks). Will not stop established pregnancy. Pt elects to start OCPS. Condoms also given.   4. Birth control counseling - See above  Return in about 4 weeks (around 09/01/2024) for [facility].   Future Appointments  Date Time Provider Department Center  08/06/2024  1:15 PM [facility] None  08/13/2024  1:15 PM [facility] None  08/20/2024  1:15 PM [facility] None  08/27/2024  1:15 PM [facility] None  09/01/2024  1:55 PM Lola Donnice HERO, MD [facility]    Total face-to-face time with patient: 20  minutes.  Over 50% of encounter was spent on counseling and coordination of care.   Levy Wellman  Claudene HOWARD 08/04/2024 4:47 PM Center for [employer], [facility]

## 2024-08-05 LAB — HEPATITIS B SURFACE ANTIBODY,QUALITATIVE: Hep B Surface Ab, Qual: NONREACTIVE

## 2024-08-06 ENCOUNTER — Ambulatory Visit: Admitting: *Deleted

## 2024-08-06 ENCOUNTER — Ambulatory Visit: Payer: Self-pay | Admitting: Advanced Practice Midwife

## 2024-08-06 VITALS — BP 107/63 | HR 68 | Temp 98.1°F | Resp 16 | Ht 62.0 in | Wt 131.6 lb

## 2024-08-06 DIAGNOSIS — D509 Iron deficiency anemia, unspecified: Secondary | ICD-10-CM | POA: Diagnosis not present

## 2024-08-06 DIAGNOSIS — D508 Other iron deficiency anemias: Secondary | ICD-10-CM

## 2024-08-06 MED ORDER — IRON SUCROSE 20 MG/ML IV SOLN
200.0000 mg | Freq: Once | INTRAVENOUS | Status: AC
  Administered 2024-08-06: 200 mg via INTRAVENOUS
  Filled 2024-08-06: qty 10

## 2024-08-06 MED ORDER — SODIUM CHLORIDE 0.9 % IV BOLUS (SEPSIS)
250.0000 mL | Freq: Once | INTRAVENOUS | Status: AC
  Administered 2024-08-06: 250 mL via INTRAVENOUS
  Filled 2024-08-06: qty 250

## 2024-08-06 NOTE — Progress Notes (Signed)
 Diagnosis: Iron  Deficiency Anemia  Provider:  Mannam, Praveen MD  Procedure: IV Push  IV Type: Peripheral, IV Location: L Forearm  Venofer  (Iron  Sucrose), Dose: 200 mg  Post Infusion IV Care: Observation period completed and Peripheral IV Discontinued  Discharge: Condition: Good, Destination: Home . AVS Provided  Performed by:  Adasha Boehme E, RN

## 2024-08-09 LAB — TOXASSURE FLEX 15, UR
6-ACETYLMORPHINE IA: NEGATIVE ng/mL
7-aminoclonazepam: NOT DETECTED ng/mg{creat}
AMPHETAMINES IA: NEGATIVE ng/mL
Alpha-hydroxyalprazolam: NOT DETECTED ng/mg{creat}
Alpha-hydroxymidazolam: NOT DETECTED ng/mg{creat}
Alpha-hydroxytriazolam: NOT DETECTED ng/mg{creat}
Alprazolam: NOT DETECTED ng/mg{creat}
BARBITURATES IA: NEGATIVE ng/mL
BUPRENORPHINE: POSITIVE
Benzodiazepines: NEGATIVE
Buprenorphine: 359 ng/mg{creat}
CANNABINOIDS IA: NEGATIVE ng/mL
COCAINE METABOLITE IA: NEGATIVE ng/mL
Clonazepam: NOT DETECTED ng/mg{creat}
Creatinine: 175 mg/dL (ref >=20)
Desalkylflurazepam: NOT DETECTED ng/mg{creat}
Desmethyldiazepam: NOT DETECTED ng/mg{creat}
Desmethylflunitrazepam: NOT DETECTED ng/mg{creat}
Diazepam: NOT DETECTED ng/mg{creat}
ETHYL ALCOHOL Enzymatic: NEGATIVE g/dL
FENTANYL: NEGATIVE
Fentanyl: NOT DETECTED ng/mg{creat}
Flunitrazepam: NOT DETECTED ng/mg{creat}
Lorazepam: NOT DETECTED ng/mg{creat}
METHADONE IA: NEGATIVE ng/mL
METHADONE MTB IA: NEGATIVE ng/mL
Midazolam: NOT DETECTED ng/mg{creat}
Norbuprenorphine: >571 ng/mg{creat}
Norfentanyl: NOT DETECTED ng/mg{creat}
OPIATE CLASS IA: NEGATIVE ng/mL
OXYCODONE CLASS IA: NEGATIVE ng/mL
Oxazepam: NOT DETECTED ng/mg{creat}
PHENCYCLIDINE IA: NEGATIVE ng/mL
TAPENTADOL, IA: NEGATIVE ng/mL
TRAMADOL IA: NEGATIVE ng/mL
Temazepam: NOT DETECTED ng/mg{creat}

## 2024-08-13 ENCOUNTER — Ambulatory Visit

## 2024-08-13 MED ORDER — SODIUM CHLORIDE 0.9 % IV BOLUS (SEPSIS)
250.0000 mL | Freq: Once | INTRAVENOUS | Status: DC
  Filled 2024-08-13: qty 250

## 2024-08-13 MED ORDER — IRON SUCROSE 20 MG/ML IV SOLN: 200.0000 mg | Freq: Once | INTRAVENOUS | Status: DC

## 2024-08-17 ENCOUNTER — Encounter: Payer: Self-pay | Admitting: Advanced Practice Midwife

## 2024-08-20 ENCOUNTER — Ambulatory Visit (INDEPENDENT_AMBULATORY_CARE_PROVIDER_SITE_OTHER)

## 2024-08-20 VITALS — BP 99/66 | HR 57 | Temp 98.1°F | Resp 16 | Ht 62.0 in | Wt 129.6 lb

## 2024-08-20 DIAGNOSIS — D509 Iron deficiency anemia, unspecified: Secondary | ICD-10-CM

## 2024-08-20 DIAGNOSIS — D508 Other iron deficiency anemias: Secondary | ICD-10-CM

## 2024-08-20 MED ORDER — SODIUM CHLORIDE 0.9 % IV BOLUS (SEPSIS)
250.0000 mL | Freq: Once | INTRAVENOUS | Status: AC
  Administered 2024-08-20: 250 mL via INTRAVENOUS
  Filled 2024-08-20: qty 250

## 2024-08-20 MED ORDER — IRON SUCROSE 20 MG/ML IV SOLN
200.0000 mg | Freq: Once | INTRAVENOUS | Status: AC
  Administered 2024-08-20: 200 mg via INTRAVENOUS
  Filled 2024-08-20: qty 10

## 2024-08-20 NOTE — Progress Notes (Signed)
 Diagnosis: Iron  Deficiency Anemia  Provider:  Praveen Mannam MD  Procedure: IV Push  IV Type: Peripheral, IV Location: L Hand  Venofer  (Iron  Sucrose), Dose: 200 mg  Post Infusion IV Care: Observation period completed Patient requested 15 min observation  Discharge: Condition: Good, Destination: Home . AVS Provided  Performed by:  Eleanor DELENA Bloch, RN

## 2024-08-27 ENCOUNTER — Ambulatory Visit

## 2024-09-01 ENCOUNTER — Encounter: Payer: Self-pay | Admitting: Advanced Practice Midwife

## 2024-09-01 ENCOUNTER — Ambulatory Visit (INDEPENDENT_AMBULATORY_CARE_PROVIDER_SITE_OTHER): Admitting: Advanced Practice Midwife

## 2024-09-01 ENCOUNTER — Ambulatory Visit

## 2024-09-01 ENCOUNTER — Ambulatory Visit: Payer: Self-pay

## 2024-09-01 ENCOUNTER — Other Ambulatory Visit: Payer: Self-pay

## 2024-09-01 VITALS — BP 109/69 | HR 61 | Wt 133.4 lb

## 2024-09-01 DIAGNOSIS — R1013 Epigastric pain: Secondary | ICD-10-CM | POA: Diagnosis not present

## 2024-09-01 DIAGNOSIS — Z5181 Encounter for therapeutic drug level monitoring: Secondary | ICD-10-CM | POA: Diagnosis not present

## 2024-09-01 DIAGNOSIS — Z79891 Long term (current) use of opiate analgesic: Secondary | ICD-10-CM | POA: Diagnosis not present

## 2024-09-01 DIAGNOSIS — F1191 Opioid use, unspecified, in remission: Secondary | ICD-10-CM

## 2024-09-01 MED ORDER — SODIUM CHLORIDE 0.9 % IV BOLUS (SEPSIS)
250.0000 mL | Freq: Once | INTRAVENOUS | Status: DC
  Filled 2024-09-01: qty 250

## 2024-09-01 MED ORDER — PANTOPRAZOLE SODIUM 20 MG PO TBEC: 20.0000 mg | DELAYED_RELEASE_TABLET | Freq: Every day | ORAL | 0 refills | Status: DC

## 2024-09-01 MED ORDER — IRON SUCROSE 20 MG/ML IV SOLN: 200.0000 mg | Freq: Once | INTRAVENOUS | Status: DC

## 2024-09-01 NOTE — Progress Notes (Signed)
 Connie Fernandez is a 28 y.o. G1P1001 here today for Suboxone  maintenance. Is 5 months postpartum. Taking Suboxone  8 mg films TID. Denies withdrawal Sx or cravings on current dose.    Health Maintenance Due  Topic Date Due   Pneumococcal Vaccine (1 of 2 - PCV) Never done   Hepatitis B Vaccines 19-59 Average Risk (1 of 3 - 19+ 3-dose series) Never done   DTaP/Tdap/Td (2 - Td or Tdap) 01/20/2021   HPV VACCINES (1 - 3-dose SCDM series) Never done   Influenza Vaccine  06/05/2024   COVID-19 Vaccine (1 - 2025-26 season) Never done    Past Medical History:  Diagnosis Date   Acne 02/19/2017   Acute cystitis with hematuria 07/25/2017   Anemia    Anxiety    Chronic pelvic pain in female    Had negative laparoscopy for endometriosis in 2017   Depression    Dysmenorrhea 02/19/2017   Encounter for monitoring Suboxone  maintenance therapy 07/13/2024   Gastritis 08/01/2021   Headache    Migraines   Heart palpitations    History of drug use 07/25/2017   History of drug use 07/25/2017   Iron  deficiency anemia 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 07/17/2024   Opioid use disorder, mild, abuse (HCC) 01/19/2020   Pelvic floor dysfunction 10/05/2016   Primary dysmenorrhea 10/05/2016   Negative laparoscopy for endometriosis     Tobacco use disorder 10/28/2015    Past Surgical History:  Procedure Laterality Date   LAPAROSCOPY N/A 06/12/2016   Procedure: LAPAROSCOPY DIAGNOSTIC;  Surgeon: Harland JAYSON Birkenhead, MD;  Location: [facility];  Service: Gynecology;  Laterality: N/A;    The following portions of the patient's history were reviewed and updated as appropriate: allergies, current medications, past family history, past medical history, past social history, past surgical history and problem list.   Health Maintenance:   Last pap:  Result Date Procedure Results Follow-ups  01/23/2022 Cytology - PAP( [place]) Neisseria Gonorrhea: Negative Chlamydia: Negative Trichomonas: Negative Adequacy: Satisfactory  for evaluation; transformation zone component PRESENT. Diagnosis: - Negative for Intraepithelial Lesions or Malignancy (NILM) Diagnosis: - Benign reactive/reparative changes Comment: Normal Reference Ranger Chlamydia - Negative Comment: Normal Reference Range Neisseria Gonorrhea - Negative Comment: Normal Reference Range Trichomonas - Negative   08/13/2019 Cytology - PAP Neisseria Gonorrhea: Negative Chlamydia: Negative Trichomonas: Negative Adequacy: Satisfactory for evaluation; transformation zone component PRESENT. Diagnosis: - Negative for intraepithelial lesion or malignancy (NILM) Comment: Normal Reference [place] Chlamydia - Negative Comment: Normal Reference Range Neisseria Gonorrhea - Negative Comment: Normal Reference Range Trichomonas - Negative   12/23/2014 HM PAP SMEAR HM Pap smear: normal     Hepatitis serologies: Lab Results  Component Value Date   HAV Negative 07/07/2024   HEPCAB NON-REACTIVE 07/25/2017    Hep A Immunization: non-immune. Recommend immunization.  Hep B Immunization: non-immune. Recommend immunization.  Last LFTs: Lab Results  Component Value Date   ALT 13 03/17/2024   AST 29 03/17/2024   ALKPHOS 98 03/17/2024   BILITOT 0.6 03/17/2024     Review of Systems:  Pertinent items noted in HPI and remainder of comprehensive ROS otherwise negative.  Physical Exam:  BP 109/69   Pulse 61   Wt 133 lb 6.4 oz (60.5 kg)   LMP 08/08/2024 (Exact Date)   Breastfeeding No   BMI 24.40 kg/m  CONSTITUTIONAL: Well-developed, well-nourished female in no acute distress.  HEENT:  Normocephalic, atraumatic.  NECK: Normal range of motion, supple, no masses noted on observation SKIN: No rash noted. Not diaphoretic.  No erythema. No pallor. MUSCULOSKELETAL: Normal range of motion. No edema noted. NEUROLOGIC: Alert and oriented to person, place, and time. Normal muscle tone coordination.  PSYCHIATRIC: Normal mood and affect. Normal behavior. Normal judgment and  thought content. RESPIRATORY: Effort normal, no problems with respiration noted ABDOMEN: Deferred PELVIC: Deferred  Labs and Imaging PDMP not reviewed this encounter.   Last UDS:    Assessment and Plan:  1. Opioid use disorder in remission (Primary) - ToxAssure Flex 15, Ur - Still has some Suboxone  from last Rx. Instructed to send MyChart message several days before next Rx needed.    2. Encounter for monitoring Suboxone  maintenance therapy  3. Epigastric pain - pantoprazole  (PROTONIX ) 20 MG tablet; Take 1 tablet (20 mg total) by mouth daily.  Dispense: 14 tablet; Refill: 0   Return in about 2 months (around 11/01/2024) for [facility].   Future Appointments  Date Time Provider Department Center  09/01/2024  3:30 PM [facility] None  09/03/2024  3:30 PM [facility] None    Total face-to-face time with patient: 15 minutes.  Over 50% of encounter was spent on counseling and coordination of care.   Latima Hamza  Claudene HOWARD 09/01/2024 3:20 PM [facility] [place], [facility]

## 2024-09-01 NOTE — Telephone Encounter (Signed)
 FYI Only or Action Required?: Action required by provider: request for appointment.  Patient was last seen in primary care on 08/01/2023 by Connie Fernandez MATSU, FNP.  Called Nurse Triage reporting Abdominal Pain.  Symptoms began about a month ago.  Interventions attempted: OTC medications: Tums and peto bismol.  Symptoms are: unchanged.  Triage Disposition: See HCP Within 4 Hours (Or PCP Triage)  Patient/caregiver understands and will follow disposition?: Yes      Copied from CRM [id number]. Topic: Clinical - Red Word Triage >> Sep 01, 2024  4:00 PM Deaijah H wrote: Red Word that prompted transfer to Nurse Triage: Burning in stomach even without eating/ heartburn Reason for Disposition  [1] MILD-MODERATE pain AND [2] not relieved by antacid medicine  Answer Assessment - Initial Assessment Questions Patient currently taking Tums and Pepto bismol for symptoms. Patient states she was seen today by another provider and given medication for symptoms. She is requesting a follow up appointment with her provider if symptoms do not resolve. Patient requesting an appointment in office after 3:00 pm.     1. LOCATION: Where does it hurt?      Upper stomach area  2. RADIATION: Does the pain shoot anywhere else? (e.g., chest, back)     Denies  3. ONSET: When did the pain begin? (e.g., minutes, hours or days ago)      1 month  4. SUDDEN: Gradual or sudden onset?     All of a sudden  5. PATTERN Does the pain come and go, or is it constant?     Constant  6. SEVERITY: How bad is the pain?  (e.g., Scale 1-10; mild, moderate, or severe)     Severe  7. AGGRAVATING FACTORS: Does anything seem to cause this pain? (e.g., foods, stress, alcohol)     She states certain foods cause symptoms to worsen  8. CARDIAC SYMPTOMS: Do you have any of the following symptoms: chest pain, difficulty breathing, sweating, nausea?     Nausea  9. OTHER SYMPTOMS: Do you have any other symptoms?  (e.g., back pain, diarrhea, fever, urination pain, vomiting)       Denies  Protocols used: Abdominal Pain - Upper-A-AH

## 2024-09-01 NOTE — Patient Instructions (Addendum)
 [facility] to find primary care provider [facility] Medicine or Internal Medicine)  Proton pump inhibitors: Prilosec Prevacid Nexium

## 2024-09-02 ENCOUNTER — Encounter: Payer: Self-pay | Admitting: Advanced Practice Midwife

## 2024-09-02 NOTE — Telephone Encounter (Signed)
 LVM to call back to office to see if pt needs an appt in office

## 2024-09-03 ENCOUNTER — Ambulatory Visit (INDEPENDENT_AMBULATORY_CARE_PROVIDER_SITE_OTHER)

## 2024-09-03 VITALS — BP 99/65 | HR 63 | Temp 98.3°F | Resp 14 | Ht 62.0 in | Wt 132.2 lb

## 2024-09-03 DIAGNOSIS — D508 Other iron deficiency anemias: Secondary | ICD-10-CM | POA: Diagnosis not present

## 2024-09-03 MED ORDER — IRON SUCROSE 20 MG/ML IV SOLN
200.0000 mg | Freq: Once | INTRAVENOUS | Status: AC
  Administered 2024-09-03: 200 mg via INTRAVENOUS
  Filled 2024-09-03: qty 10

## 2024-09-03 MED ORDER — SODIUM CHLORIDE 0.9 % IV BOLUS (SEPSIS)
250.0000 mL | Freq: Once | INTRAVENOUS | Status: AC
  Administered 2024-09-03: 250 mL via INTRAVENOUS
  Filled 2024-09-03: qty 250

## 2024-09-03 NOTE — Progress Notes (Signed)
 Diagnosis: Iron Deficiency Anemia  Provider:  Chilton Greathouse MD  Procedure: IV Push  IV Type: Peripheral, IV Location: L Forearm  Venofer (Iron Sucrose), Dose: 200 mg  Post Infusion IV Care: Observation period completed and Peripheral IV Discontinued  Discharge: Condition: Good, Destination: Home . AVS Declined  Performed by:  Wyvonne Lenz, RN

## 2024-09-07 LAB — TOXASSURE FLEX 15, UR
6-ACETYLMORPHINE IA: NEGATIVE ng/mL
7-aminoclonazepam: NOT DETECTED ng/mg{creat}
AMPHETAMINES IA: NEGATIVE ng/mL
Alpha-hydroxyalprazolam: NOT DETECTED ng/mg{creat}
Alpha-hydroxymidazolam: NOT DETECTED ng/mg{creat}
Alpha-hydroxytriazolam: NOT DETECTED ng/mg{creat}
Alprazolam: NOT DETECTED ng/mg{creat}
BARBITURATES IA: NEGATIVE ng/mL
BUPRENORPHINE: POSITIVE
Benzodiazepines: NEGATIVE
Buprenorphine: 276 ng/mg{creat}
CANNABINOIDS IA: NEGATIVE ng/mL
Clonazepam: NOT DETECTED ng/mg{creat}
Creatinine: 168 mg/dL (ref >=20)
Desalkylflurazepam: NOT DETECTED ng/mg{creat}
Desmethyldiazepam: NOT DETECTED ng/mg{creat}
Desmethylflunitrazepam: NOT DETECTED ng/mg{creat}
Diazepam: NOT DETECTED ng/mg{creat}
ETHYL ALCOHOL Enzymatic: NEGATIVE g/dL
FENTANYL: NEGATIVE
Fentanyl: NOT DETECTED ng/mg{creat}
Flunitrazepam: NOT DETECTED ng/mg{creat}
Lorazepam: NOT DETECTED ng/mg{creat}
METHADONE IA: NEGATIVE ng/mL
METHADONE MTB IA: NEGATIVE ng/mL
Midazolam: NOT DETECTED ng/mg{creat}
Norbuprenorphine: >595 ng/mg{creat}
Norfentanyl: NOT DETECTED ng/mg{creat}
OPIATE CLASS IA: NEGATIVE ng/mL
OXYCODONE CLASS IA: NEGATIVE ng/mL
Oxazepam: NOT DETECTED ng/mg{creat}
PHENCYCLIDINE IA: NEGATIVE ng/mL
TAPENTADOL, IA: NEGATIVE ng/mL
TRAMADOL IA: NEGATIVE ng/mL
Temazepam: NOT DETECTED ng/mg{creat}

## 2024-09-07 LAB — COCAINE AND MTB, MS, UR RFX
Benzoylecgonine: 112 ng/mg{creat}
Cocaethylene: NOT DETECTED ng/mg{creat}
Cocaine Confirmation: POSITIVE — AB
Cocaine: NOT DETECTED ng/mg{creat}

## 2024-09-07 NOTE — Telephone Encounter (Signed)
 LVM to call back to office to seed if pt is needing an appt

## 2024-09-09 ENCOUNTER — Ambulatory Visit: Payer: Self-pay | Admitting: Advanced Practice Midwife

## 2024-09-09 NOTE — Telephone Encounter (Signed)
 Spoke to pt and scheduled an appt to  see Connie Fernandez on Nov 21st @ 3:40

## 2024-09-11 ENCOUNTER — Ambulatory Visit

## 2024-09-11 MED ORDER — IRON SUCROSE 20 MG/ML IV SOLN: 200.0000 mg | Freq: Once | INTRAVENOUS | Status: DC

## 2024-09-11 MED ORDER — SODIUM CHLORIDE 0.9 % IV BOLUS (SEPSIS)
250.0000 mL | Freq: Once | INTRAVENOUS | Status: DC
  Filled 2024-09-11: qty 250

## 2024-09-25 ENCOUNTER — Encounter: Payer: Self-pay | Admitting: Nurse Practitioner

## 2024-09-25 ENCOUNTER — Ambulatory Visit: Admitting: Nurse Practitioner

## 2024-09-25 VITALS — BP 136/82 | HR 68 | Temp 97.8°F | Ht 62.0 in | Wt 130.2 lb

## 2024-09-25 DIAGNOSIS — S0990XA Unspecified injury of head, initial encounter: Secondary | ICD-10-CM

## 2024-09-25 DIAGNOSIS — D508 Other iron deficiency anemias: Secondary | ICD-10-CM | POA: Diagnosis not present

## 2024-09-25 DIAGNOSIS — K5900 Constipation, unspecified: Secondary | ICD-10-CM | POA: Diagnosis not present

## 2024-09-25 DIAGNOSIS — K219 Gastro-esophageal reflux disease without esophagitis: Secondary | ICD-10-CM

## 2024-09-25 MED ORDER — FAMOTIDINE 20 MG PO TABS: 20.0000 mg | ORAL_TABLET | Freq: Every day | ORAL | 0 refills | Status: DC

## 2024-09-25 NOTE — Progress Notes (Signed)
 Established Patient Office Visit  Subjective:  Patient ID: Connie Fernandez, female    DOB: [birth year]  Age: 28 y.o. MRN: [id number]  CC:  Chief Complaint  Patient presents with   Acute Visit    Burning in middle of chest under breast bone Legs itchy at the bottom asking for Iron  check Hit head on door last week has had headache and been nauseated since then Constipation   Discussed the use of AI scribe software for clinical note transcription with the patient, who gave verbal consent to proceed.  History of Present Illness  Connie Fernandez is a 28 year old female who presents with persistent headaches following a head injury.   She hit her head on the door last week and since then has had daily pressure-like headaches throughout the day. Tylenol  1000 mg once daily has not helped. She feel intermittent nausea  with headache. She denies dizziness or bleeding at the time of the injury.  She has iron  deficiency anemia and was receiving weekly iron  infusions for five weeks. Since her iron  was low she has had severe itching on her legs that persists despite lotion and sometimes causes bruising. Iron  levels have not been checked since starting infusions. She would like to get that checked.  She has epigastric burning  that occurs without eating or drinking and can last for hours. She takes pantoprazole  during the day instead of in the morning.   She reports constipation with bowel movements about once a week and has not tried stool softeners or increased fluids. She denies burping, dizziness, and shortness of breath. Nausea is intermittent.   Past Medical History:  Diagnosis Date   Acne 02/19/2017   Acute cystitis with hematuria 07/25/2017   Anemia    Anxiety    Chronic pelvic pain in female    Had negative laparoscopy for endometriosis in 2017   Depression    Dysmenorrhea 02/19/2017   Encounter for monitoring Suboxone  maintenance therapy 07/13/2024   Gastritis 08/01/2021    Headache    Migraines   Heart palpitations    History of drug use 07/25/2017   History of drug use 07/25/2017   Iron  deficiency anemia 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 07/17/2024   Opioid use disorder, mild, abuse (HCC) 01/19/2020   Pelvic floor dysfunction 10/05/2016   Primary dysmenorrhea 10/05/2016   Negative laparoscopy for endometriosis     Tobacco use disorder 10/28/2015    Past Surgical History:  Procedure Laterality Date   LAPAROSCOPY N/A 06/12/2016   Procedure: LAPAROSCOPY DIAGNOSTIC;  Surgeon: Harland JAYSON Birkenhead, MD;  Location: [facility];  Service: Gynecology;  Laterality: N/A;    Family History  Problem Relation Age of Onset   Diabetes Mother    Alcohol abuse Father    Hypertension Father    COPD Maternal Grandmother    Early death Maternal Grandmother 81   Heart disease Maternal Grandmother    Heart disease Maternal Grandfather 15   Parkinsonism Paternal Grandmother     Social History   Socioeconomic History   Marital status: Single    Spouse name: Not on file   Number of children: Not on file   Years of education: Not on file   Highest education level: Not on file  Occupational History   Not on file  Tobacco Use   Smoking status: Every Day    Types: E-cigarettes   Smokeless tobacco: Never  Vaping Use   Vaping status: Every Day   Substances: Nicotine , Flavoring  Substance and Sexual  Activity   Alcohol use: Not Currently    Comment: few drinks per month.    Drug use: Not Currently    Comment: Just stopped Kratum last week   Sexual activity: Yes    Partners: Male    Birth control/protection: None  Other Topics Concern   Not on file  Social History Narrative   Lives with mother, father, and 2 sisters    Working at [employer] Below 3:30- 9:30pm   1 dog    Right handed    Enjoys working, chief of staff out with friends         Social Drivers of Corporate Investment Banker Strain: Not on file  Food Insecurity: No Food Insecurity (03/17/2024)   Hunger Vital Sign     Worried About Running Out of Food in the Last Year: Never true    Ran Out of Food in the Last Year: Never true  Transportation Needs: No Transportation Needs (03/17/2024)   PRAPARE - Administrator, Civil Service (Medical): No    Lack of Transportation (Non-Medical): No  Physical Activity: Not on file  Stress: Not on file  Social Connections: Not on file  Intimate Partner Violence: Not At Risk (03/17/2024)   Humiliation, Afraid, Rape, and Kick questionnaire    Fear of Current or Ex-Partner: No    Emotionally Abused: No    Physically Abused: No    Sexually Abused: No     Outpatient Medications Prior to Visit  Medication Sig Dispense Refill   ferrous sulfate  325 (65 FE) MG tablet Take 1 tablet (325 mg total) by mouth every other day. 45 tablet 2   pantoprazole  (PROTONIX ) 20 MG tablet Take 1 tablet (20 mg total) by mouth daily. 14 tablet 0   Drospirenone-Estetrol (NEXTSTELLIS ) 3-14.2 MG TABS Take 1 tablet by mouth daily at 12 noon. (Patient not taking: Reported on 09/01/2024) 84 tablet 3   Prenatal MV & Min w/FA-DHA (PRENATAL GUMMIES PO) Take by mouth. (Patient not taking: Reported on 09/01/2024)     No facility-administered medications prior to visit.    Allergies  Allergen Reactions   Latex Itching   Penicillins Rash    Able to take Keflex  and Rocephin  Has patient had a PCN reaction causing immediate rash, facial/tongue/throat swelling, SOB or lightheadedness with hypotension: Dizziness and shaky. Has patient had a PCN reaction causing severe rash involving mucus membranes or skin necrosis: NO Has patient had a PCN reaction that required hospitalization: NO Has patient had a PCN reaction occurring within the last 10 years: NO If all of the above answers are NO, then may proceed with Cephalosporin use.     ROS Review of Systems Negative unless indicated in HPI.    Objective:    Physical Exam Constitutional:      Appearance: Normal appearance.  HENT:      Mouth/Throat:     Mouth: Mucous membranes are moist.  Eyes:     Conjunctiva/sclera: Conjunctivae normal.     Pupils: Pupils are equal, round, and reactive to light.  Cardiovascular:     Rate and Rhythm: Normal rate and regular rhythm.     Pulses: Normal pulses.     Heart sounds: Normal heart sounds.  Pulmonary:     Effort: Pulmonary effort is normal.     Breath sounds: Normal breath sounds.  Abdominal:     General: Bowel sounds are normal.     Palpations: Abdomen is soft. There is no mass.     Tenderness: There  is no guarding.  Musculoskeletal:     Cervical back: Normal range of motion. No tenderness.  Skin:    General: Skin is warm.     Findings: No bruising.  Neurological:     General: No focal deficit present.     Mental Status: She is alert and oriented to person, place, and time. Mental status is at baseline.     Coordination: Coordination normal.     Gait: Gait normal.  Psychiatric:        Mood and Affect: Mood normal.        Behavior: Behavior normal.        Thought Content: Thought content normal.        Judgment: Judgment normal.     BP 136/82   Pulse 68   Temp 97.8 F (36.6 C)   Ht 5' 2 (1.575 m)   Wt 130 lb 3.2 oz (59.1 kg)   LMP 08/08/2024 (Exact Date)   SpO2 99%   BMI 23.81 kg/m  Wt Readings from Last 3 Encounters:  09/25/24 130 lb 3.2 oz (59.1 kg)  09/03/24 132 lb 3.2 oz (60 kg)  09/01/24 133 lb 6.4 oz (60.5 kg)     Health Maintenance  Topic Date Due   Pneumococcal Vaccine (1 of 2 - PCV) Never done   Hepatitis B Vaccines 19-59 Average Risk (1 of 3 - 19+ 3-dose series) Never done   DTaP/Tdap/Td (2 - Td or Tdap) 01/20/2021   HPV VACCINES (1 - 3-dose SCDM series) Never done   COVID-19 Vaccine (1 - 2025-26 season) Never done   Influenza Vaccine  02/02/2025 (Originally 06/05/2024)   Cervical Cancer Screening (Pap smear)  01/23/2025   Hepatitis C Screening  Completed   HIV Screening  Completed   Meningococcal B Vaccine  Aged Out       Topic  Date Due   Hepatitis B Vaccines 19-59 Average Risk (1 of 3 - 19+ 3-dose series) Never done   HPV VACCINES (1 - 3-dose SCDM series) Never done    Lab Results  Component Value Date   TSH 0.814 03/01/2022   Lab Results  Component Value Date   WBC 4.9 07/07/2024   HGB 12.0 07/07/2024   HCT 36.9 07/07/2024   MCV 86 07/07/2024   PLT 233 07/07/2024   Lab Results  Component Value Date   NA 133 (L) 03/17/2024   K 4.4 03/17/2024   CO2 22 03/17/2024   GLUCOSE 77 03/17/2024   BUN 6 03/17/2024   CREATININE 0.62 03/17/2024   BILITOT 0.6 03/17/2024   ALKPHOS 98 03/17/2024   AST 29 03/17/2024   ALT 13 03/17/2024   PROT 5.6 (L) 03/17/2024   ALBUMIN 2.9 (L) 03/17/2024   CALCIUM 8.7 (L) 03/17/2024   ANIONGAP 7 03/17/2024   EGFR 116 09/16/2023   GFR 88.58 06/08/2022   Lab Results  Component Value Date   CHOL 145 12/17/2019   Lab Results  Component Value Date   HDL 58.20 12/17/2019   Lab Results  Component Value Date   LDLCALC 69 12/17/2019   Lab Results  Component Value Date   TRIG 87.0 12/17/2019   Lab Results  Component Value Date   CHOLHDL 2 12/17/2019   Lab Results  Component Value Date   HGBA1C 5.4 09/16/2023      Assessment & Plan:   Assessment & Plan Injury of head, initial encounter Persistent headache and nausea post-head injury.  Stat CT scan ordered to rule out concussion or intracranial  injury. - Ordered CT scan of the head. Orders:   CT HEAD WO CONTRAST ( )  Other iron  deficiency anemia Iron  deficiency anemia with pruritus. I - Ordered iron  level test. Orders:   Iron , TIBC and Ferritin Panel; Future  Gastroesophageal reflux disease, unspecified whether esophagitis present Persistent gastric burning not relieved by current regimen.  - Instructed to take pantoprazole  in the morning before eating. - Prescribed famotidine  at night. - Advised to avoid acidic foods.    Constipation, unspecified constipation type Infrequent bowel movements  despite normal diet.  - Recommended over-the-counter stool softeners. - Advised use of Miralax . - Encouraged increased dietary fiber. - Suggested drinking prune juice. - Advised increasing fluid intake.     Assessment and Plan Assessment & Plan    Follow-up: Return if symptoms worsen or fail to improve.   Jencarlo Bonadonna, NP

## 2024-09-26 ENCOUNTER — Emergency Department
Admission: EM | Admit: 2024-09-26 | Discharge: 2024-09-26 | Disposition: A | Discharge status: Home / Self Care | Attending: Emergency Medicine | Admitting: Emergency Medicine

## 2024-09-26 ENCOUNTER — Emergency Department

## 2024-09-26 ENCOUNTER — Other Ambulatory Visit: Payer: Self-pay

## 2024-09-26 DIAGNOSIS — R519 Headache, unspecified: Secondary | ICD-10-CM | POA: Diagnosis present

## 2024-09-26 DIAGNOSIS — Z720 Tobacco use: Secondary | ICD-10-CM | POA: Diagnosis not present

## 2024-09-26 MED ORDER — KETOROLAC TROMETHAMINE 30 MG/ML IJ SOLN
30.0000 mg | Freq: Once | INTRAMUSCULAR | Status: AC
  Administered 2024-09-26: 30 mg via INTRAMUSCULAR
  Filled 2024-09-26: qty 1

## 2024-09-26 MED ORDER — DEXAMETHASONE SOD PHOSPHATE PF 10 MG/ML IJ SOLN
10.0000 mg | Freq: Once | INTRAMUSCULAR | Status: AC
  Administered 2024-09-26: 10 mg via INTRAMUSCULAR

## 2024-09-26 NOTE — ED Provider Notes (Signed)
 [facility] Emergency Department Provider Note     Event Date/Time   First MD Initiated Contact with Patient 09/26/24 1819     (approximate)   History   bumped head   HPI  Connie Fernandez is a 28 y.o. female with a past medical history of anxiety, tobacco use and opioid use disorder presents to the ED for head injury.  Patient reports a week ago she was getting close out of her dryer when she fell backwards hitting a doorknob to the crown of her head.  She reports a headache that has been persistent.  Headache is described as constant pressure localized to the area that make contact to the doorknob.  She denies LOC during the event.  She was evaluated by her primary care provider yesterday for same complaint who recommended a CT scan to rule out concussion or intracranial injury.       Physical Exam   Triage Vital Signs: ED Triage Vitals  Encounter Vitals Group     BP 09/26/24 1758 107/71     Girls Systolic BP Percentile --      Girls Diastolic BP Percentile --      Boys Systolic BP Percentile --      Boys Diastolic BP Percentile --      Pulse Rate 09/26/24 1758 64     Resp 09/26/24 1758 20     Temp 09/26/24 1758 98 F (36.7 C)     Temp Source 09/26/24 1758 Oral     SpO2 09/26/24 1758 100 %     Weight 09/26/24 1757 129 lb (58.5 kg)     Height 09/26/24 1757 5' 2 (1.575 m)     Head Circumference --      Peak Flow --      Pain Score 09/26/24 1756 7     Pain Loc --      Pain Education --      Exclude from Growth Chart --     Most recent vital signs: Vitals:   09/26/24 1758  BP: 107/71  Pulse: 64  Resp: 20  Temp: 98 F (36.7 C)  SpO2: 100%   General: Well appearing and comfortable. Alert and oriented. INAD.  Head:  NCAT.  Eyes:  PERRLA. EOMI.  Nose:   Mucosa is moist. No rhinorrhea. CV:  Good peripheral perfusion. RRR. RESP:  Normal effort. LCTAB.  NEURO: Cranial nerves II-XII intact. No focal deficits. Speech clear. Sensation  and motor function intact. Normal muscle strength of UE & LE.  Smooth finger-nose test.  Good coordination with heel-to-shin test.  Gait is steady.    ED Results / Procedures / Treatments   Labs (all labs ordered are listed, but only abnormal results are displayed) Labs Reviewed - No data to display  RADIOLOGY  I personally viewed and evaluated these images as part of my medical decision making, as well as reviewing the written report by the radiologist.  CT Head Wo Contrast Result Date: 09/26/2024 EXAM: CT HEAD WITHOUT CONTRAST 09/26/2024 07:26:00 PM TECHNIQUE: CT of the head was performed without the administration of intravenous contrast. Automated exposure control, iterative reconstruction, and/or weight based adjustment of the mA/kV was utilized to reduce the radiation dose to as low as reasonably achievable. COMPARISON: 04/09/2023 CLINICAL HISTORY: Head trauma, repeat vomiting (Age 80-64y). FINDINGS: BRAIN AND VENTRICLES: No acute hemorrhage. No evidence of acute infarct. No hydrocephalus. No extra-axial collection. No mass effect or midline shift. ORBITS: No acute abnormality. SINUSES: No acute  abnormality. SOFT TISSUES AND SKULL: No acute soft tissue abnormality. No skull fracture. IMPRESSION: 1. No acute intracranial abnormality. Electronically signed by: Franky Stanford MD 09/26/2024 07:36 PM EST RP Workstation: [id number]    PROCEDURES:  Critical Care performed: No  Procedures  MEDICATIONS ORDERED IN ED: Medications  ketorolac  (TORADOL ) 30 MG/ML injection 30 mg (30 mg Intramuscular Given 09/26/24 1855)  dexamethasone  (DECADRON ) injection 10 mg (10 mg Intramuscular Given 09/26/24 1855)   IMPRESSION / MDM / ASSESSMENT AND PLAN / ED COURSE  I reviewed the triage vital signs and the nursing notes.                               28 y.o. female presents to the emergency department for evaluation and treatment of headache. See HPI for further details.   Differential diagnosis  includes, but is not limited to, intracranial hemorrhage, previous head trauma, tension headache, migraine or migraine equivalent, idiopathic intracranial hypertension, and non-specific headache, concussion.   Patient's presentation is most consistent with acute complicated illness / injury requiring diagnostic workup.  Patient is alert and oriented.  She is hemodynamic stable.  Physical exam findings as stated above.  Normal neuroexam.  No red flag signs.  CT head normal.  Advised to follow-up with primary care provider symptoms persist.  The patient is stable condition for discharge home.  FINAL CLINICAL IMPRESSION(S) / ED DIAGNOSES   Final diagnoses:  Bad headache   Rx / DC Orders   ED Discharge Orders     None      Note:  This document was prepared using Dragon voice recognition software and may include unintentional dictation errors.    Margrette, Satsuki Zillmer A, PA-C 09/26/24 2251    Nicholaus Rolland BRAVO, MD 09/27/24 [phone]

## 2024-09-26 NOTE — ED Triage Notes (Signed)
 Pt ambulatory to triage after hitting the top of her head on the doorknob while getting clothes out of the dryer. Steady gait, no weakness noted.

## 2024-09-26 NOTE — Discharge Instructions (Signed)
 You were evaluated in the ED for a headache and/or head injury. Your evaluation did not reveal signs of a serious condition such as a brain bleed or skull fracture and were reassuring.   Consider these rest and recovery strategies at home:  - Avoid strenous activity , exercise or heavy lifting for a few days.  -Limit screen time and activities requiring intense focus.  - Gradually return to normal activities as symptoms improve   Take tylenol  as needed or OTC Excedrin.   Pain control:  Ibuprofen  (motrin /aleve /advil ) - You can take 3 tablets (600 mg) every 6 hours as needed for pain.  Acetaminophen  (tylenol ) - You can take 2 extra strength tablets (1000 mg) every 6 hours as needed for pain.  You can alternate these medications or take them together.  Make sure you eat food/drink water when taking these medications.   Apply ice/cold pack to the affected area 10-20 minutes every 2-3 hours for the first 24-48 hours to reduce pain and swelling. Avoid alcohol and caffeine.   Follow up with you PCP or neurologist in 1-2 weeks if symptoms persist or worsen.

## 2024-09-28 ENCOUNTER — Other Ambulatory Visit (INDEPENDENT_AMBULATORY_CARE_PROVIDER_SITE_OTHER)

## 2024-09-28 DIAGNOSIS — D508 Other iron deficiency anemias: Secondary | ICD-10-CM | POA: Diagnosis not present

## 2024-09-29 LAB — IRON,TIBC AND FERRITIN PANEL
%SAT: 24 % (ref 16–45)
Ferritin: 51 ng/mL (ref 16–154)
Iron: 62 ug/dL (ref 40–190)
TIBC: 256 ug/dL (ref 250–450)

## 2024-09-30 ENCOUNTER — Ambulatory Visit: Payer: Self-pay | Admitting: Nurse Practitioner

## 2024-10-10 ENCOUNTER — Encounter: Payer: Self-pay | Admitting: Advanced Practice Midwife

## 2024-10-10 DIAGNOSIS — F1191 Opioid use, unspecified, in remission: Secondary | ICD-10-CM

## 2024-10-13 MED ORDER — BUPRENORPHINE HCL-NALOXONE HCL 8-2 MG SL FILM: 1.0000 | ORAL_FILM | Freq: Three times a day (TID) | SUBLINGUAL | 0 refills | Status: AC

## 2024-10-15 ENCOUNTER — Ambulatory Visit: Admitting: Family

## 2024-10-27 ENCOUNTER — Encounter: Payer: Self-pay | Admitting: Family Medicine

## 2024-10-27 ENCOUNTER — Ambulatory Visit: Admitting: Family Medicine

## 2024-10-27 ENCOUNTER — Other Ambulatory Visit: Payer: Self-pay

## 2024-10-27 VITALS — BP 123/72 | HR 83

## 2024-10-27 DIAGNOSIS — F1191 Opioid use, unspecified, in remission: Secondary | ICD-10-CM | POA: Diagnosis not present

## 2024-10-27 DIAGNOSIS — Z3202 Encounter for pregnancy test, result negative: Secondary | ICD-10-CM | POA: Diagnosis not present

## 2024-10-27 DIAGNOSIS — Z3041 Encounter for surveillance of contraceptive pills: Secondary | ICD-10-CM | POA: Diagnosis not present

## 2024-10-27 LAB — POCT PREGNANCY, URINE: Preg Test, Ur: NEGATIVE

## 2024-10-27 NOTE — Progress Notes (Signed)
 "   Connie Fernandez is a 28 y.o. G1P1001 here today for OUD follow up.  Last seen 09/01/2024, on suboxone  8 TID UDS from that visit showed unexpected (per patient) cocaine  metabolites  Today reports she is tapering down a bit, down to 1 film BID Doesn't want to taper off for now, just feels like the lower dose if better  Also concerned about possible pregnancy Prescribed OCP's but not currently taking them  Health Maintenance Due  Topic Date Due   Pneumococcal Vaccine (1 of 2 - PCV) Never done   Hepatitis B Vaccines 19-59 Average Risk (1 of 3 - 19+ 3-dose series) Never done   DTaP/Tdap/Td (2 - Td or Tdap) 01/20/2021   HPV VACCINES (1 - 3-dose SCDM series) Never done   COVID-19 Vaccine (1 - 2025-26 season) Never done    Past Medical History:  Diagnosis Date   Acne 02/19/2017   Acute cystitis with hematuria 07/25/2017   Anemia    Anxiety    Chronic pelvic pain in female    Had negative laparoscopy for endometriosis in 2017   Depression    Dysmenorrhea 02/19/2017   Encounter for monitoring Suboxone  maintenance therapy 07/13/2024   Gastritis 08/01/2021   Headache    Migraines   Heart palpitations    History of drug use 07/25/2017   History of drug use 07/25/2017   Iron  deficiency anemia 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 07/17/2024   Opioid use disorder, mild, abuse (HCC) 01/19/2020   Pelvic floor dysfunction 10/05/2016   Primary dysmenorrhea 10/05/2016   Negative laparoscopy for endometriosis     Tobacco use disorder 10/28/2015    Past Surgical History:  Procedure Laterality Date   LAPAROSCOPY N/A 06/12/2016   Procedure: LAPAROSCOPY DIAGNOSTIC;  Surgeon: Harland JAYSON Birkenhead, MD;  Location: [facility];  Service: Gynecology;  Laterality: N/A;    The following portions of the patient's history were reviewed and updated as appropriate: allergies, current medications, past family history, past medical history, past social history, past surgical history and problem list.   Health  Maintenance:   Last pap:  Result Date Procedure Results Follow-ups  01/23/2022 Cytology - PAP( [facility]) Neisseria Gonorrhea: Negative Chlamydia: Negative Trichomonas: Negative Adequacy: Satisfactory for evaluation; transformation zone component PRESENT. Diagnosis: - Negative for Intraepithelial Lesions or Malignancy (NILM) Diagnosis: - Benign reactive/reparative changes Comment: Normal Reference Ranger Chlamydia - Negative Comment: Normal Reference Range Neisseria Gonorrhea - Negative Comment: Normal Reference Range Trichomonas - Negative   08/13/2019 Cytology - PAP Neisseria Gonorrhea: Negative Chlamydia: Negative Trichomonas: Negative Adequacy: Satisfactory for evaluation; transformation zone component PRESENT. Diagnosis: - Negative for intraepithelial lesion or malignancy (NILM) Comment: Normal Reference Ranger Chlamydia - Negative Comment: Normal Reference Range Neisseria Gonorrhea - Negative Comment: Normal Reference Range Trichomonas - Negative   12/23/2014 HM PAP SMEAR HM Pap smear: normal     Last mammogram:  N/a    Hepatitis serologies: Lab Results  Component Value Date   HAV Negative 07/07/2024   HEPCAB NON-REACTIVE 07/25/2017     Last LFTs: Lab Results  Component Value Date   ALT 13 03/17/2024   AST 29 03/17/2024   ALKPHOS 98 03/17/2024   BILITOT 0.6 03/17/2024     Review of Systems:  Pertinent items noted in HPI and remainder of comprehensive ROS otherwise negative.  Physical Exam:  BP 123/72   Pulse 83   LMP 09/09/2024   Breastfeeding No  CONSTITUTIONAL: Well-developed, well-nourished female in no acute distress.  HEENT:  Normocephalic, atraumatic. External right and left  ear normal. No scleral icterus.  NECK: Normal range of motion, supple, no masses noted on observation SKIN: No rash noted. Not diaphoretic. No erythema. No pallor. MUSCULOSKELETAL: Normal range of motion. No edema noted. NEUROLOGIC: Alert and oriented to person, place, and  time. Normal muscle tone coordination.  PSYCHIATRIC: Normal mood and affect. Normal behavior. Normal judgment and thought content. RESPIRATORY: Effort normal, no problems with respiration noted  Labs and Imaging I have reviewed the PDMP during this encounter.    Last UDS: Lab Results  Component Value Date   CREATIUR 168 09/01/2024    Results for orders placed or performed in visit on 10/27/24 (from the past week)  Pregnancy, urine POC   Collection Time: 10/27/24  4:19 PM  Result Value Ref Range   Preg Test, Ur NEGATIVE NEGATIVE   No results found.       Assessment and Plan:   Problem List Items Addressed This Visit       Other   Opioid use disorder in remission - Primary   Had been stable for some time on 8 TID but self weaned to 8 BID and feels like this does is better for her, no issue with that, refill timing will be extended a bit, not yet due at present Some cocaine  in last UDS, patient with knowledge of partner using but denies personal use Bup metabolites appropriate, repeat UDS today      Relevant Orders   ToxAssure Flex 15, Ur   Oral contraceptive pill surveillance   UPT negative today. Encouraged to start once she gets her next period and if desires counseling or prescription/insertion of alternative we are available.          Return in about 2 months (around 12/28/2024) for REACH clinic, OUD f/u.    Total face-to-face time with patient: 15 minutes.  Over 50% of encounter was spent on counseling and coordination of care.  No future appointments.  Donnice CHRISTELLA Carolus, MD/MPH Attending Family Medicine Physician, [facility] for [facility], [facility] "

## 2024-10-27 NOTE — Assessment & Plan Note (Addendum)
 Had been stable for some time on 8 TID but self weaned to 8 BID and feels like this does is better for her, no issue with that, refill timing will be extended a bit, not yet due at present Some cocaine  in last UDS, patient with knowledge of partner using but denies personal use Bup metabolites appropriate, repeat UDS today

## 2024-10-27 NOTE — Assessment & Plan Note (Signed)
 UPT negative today. Encouraged to start once she gets her next period and if desires counseling or prescription/insertion of alternative we are available.

## 2024-11-01 LAB — TOXASSURE FLEX 15, UR
6-ACETYLMORPHINE IA: NEGATIVE ng/mL
7-aminoclonazepam: NOT DETECTED ng/mg{creat}
AMPHETAMINES IA: NEGATIVE ng/mL
Alpha-hydroxyalprazolam: NOT DETECTED ng/mg{creat}
Alpha-hydroxymidazolam: NOT DETECTED ng/mg{creat}
Alpha-hydroxytriazolam: NOT DETECTED ng/mg{creat}
Alprazolam: NOT DETECTED ng/mg{creat}
BARBITURATES IA: NEGATIVE ng/mL
BUPRENORPHINE: POSITIVE
Benzodiazepines: NEGATIVE
Buprenorphine: 268 ng/mg{creat}
CANNABINOIDS IA: NEGATIVE ng/mL
COCAINE METABOLITE IA: NEGATIVE ng/mL
Clonazepam: NOT DETECTED ng/mg{creat}
Creatinine: 129 mg/dL
Desalkylflurazepam: NOT DETECTED ng/mg{creat}
Desmethyldiazepam: NOT DETECTED ng/mg{creat}
Desmethylflunitrazepam: NOT DETECTED ng/mg{creat}
Diazepam: NOT DETECTED ng/mg{creat}
ETHYL ALCOHOL Enzymatic: NEGATIVE g/dL
FENTANYL: NEGATIVE
Fentanyl: NOT DETECTED ng/mg{creat}
Flunitrazepam: NOT DETECTED ng/mg{creat}
Lorazepam: NOT DETECTED ng/mg{creat}
METHADONE IA: NEGATIVE ng/mL
METHADONE MTB IA: NEGATIVE ng/mL
Midazolam: NOT DETECTED ng/mg{creat}
Norbuprenorphine: >775 ng/mg{creat}
Norfentanyl: NOT DETECTED ng/mg{creat}
OPIATE CLASS IA: NEGATIVE ng/mL
OXYCODONE CLASS IA: NEGATIVE ng/mL
Oxazepam: NOT DETECTED ng/mg{creat}
PHENCYCLIDINE IA: NEGATIVE ng/mL
TAPENTADOL, IA: NEGATIVE ng/mL
TRAMADOL IA: NEGATIVE ng/mL
Temazepam: NOT DETECTED ng/mg{creat}

## 2024-11-02 ENCOUNTER — Ambulatory Visit: Payer: Self-pay | Admitting: Advanced Practice Midwife

## 2024-11-20 ENCOUNTER — Telehealth: Payer: Self-pay

## 2024-11-20 NOTE — Telephone Encounter (Signed)
 Auth Submission: NO AUTH NEEDED Site of care: Site of care: [facility] Payer: [employer] [place] medicaid Medication & CPT/J Code(s) submitted: Venofer  (Iron  Sucrose) J1756 Diagnosis Code:  Route of submission (phone, fax, portal):  Phone # Fax # Auth type: Buy/Bill PB Units/visits requested: 200mg  x 1 dose remaining Reference number:  Approval from: 07/20/24 to 03/04/25

## 2024-12-01 ENCOUNTER — Other Ambulatory Visit: Payer: Self-pay

## 2024-12-01 ENCOUNTER — Ambulatory Visit: Payer: Self-pay | Admitting: Family Medicine

## 2024-12-01 ENCOUNTER — Encounter: Payer: Self-pay | Admitting: Family Medicine

## 2024-12-01 VITALS — BP 105/67 | HR 65 | Wt 131.2 lb

## 2024-12-01 DIAGNOSIS — Z3041 Encounter for surveillance of contraceptive pills: Secondary | ICD-10-CM

## 2024-12-01 DIAGNOSIS — Z862 Personal history of diseases of the blood and blood-forming organs and certain disorders involving the immune mechanism: Secondary | ICD-10-CM | POA: Diagnosis not present

## 2024-12-01 DIAGNOSIS — F1191 Opioid use, unspecified, in remission: Secondary | ICD-10-CM

## 2024-12-01 MED ORDER — BUPRENORPHINE HCL-NALOXONE HCL 8-2 MG SL FILM: 1.0000 | ORAL_FILM | Freq: Three times a day (TID) | SUBLINGUAL | 0 refills | Status: AC

## 2024-12-01 NOTE — Progress Notes (Signed)
 "   Connie Fernandez is a 29 y.o. G1P1001 here today for OUD follow up.  Last seen 10/27/25, discussed abnormal UDS from prior visit at that time but UDS from that visit was normal Also started on OCP's  Last fill 10/14/24, reports she thought she had to be seen in clinic to get a refill so has been doing her best to survive on just one a day Otherwise doing well  Did not start OCP's but reports she is abstinent  Feeling fatigued, similar to how she did when she was anemia S/p IV iron  in 09/2024  Health Maintenance Due  Topic Date Due   Pneumococcal Vaccine (1 of 2 - PCV) Never done   Hepatitis B Vaccines 19-59 Average Risk (1 of 3 - 19+ 3-dose series) Never done   DTaP/Tdap/Td (2 - Td or Tdap) 01/20/2021   COVID-19 Vaccine (1 - 2025-26 season) Never done   Cervical Cancer Screening (Pap smear)  01/23/2025    Past Medical History:  Diagnosis Date   Acne 02/19/2017   Acute cystitis with hematuria 07/25/2017   Anemia    Anxiety    Chronic pelvic pain in female    Had negative laparoscopy for endometriosis in 2017   Depression    Dysmenorrhea 02/19/2017   Encounter for monitoring Suboxone  maintenance therapy 07/13/2024   Gastritis 08/01/2021   Headache    Migraines   Heart palpitations    History of drug use 07/25/2017   History of drug use 07/25/2017   Iron  deficiency anemia 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 07/17/2024   Opioid use disorder, mild, abuse (HCC) 01/19/2020   Pelvic floor dysfunction 10/05/2016   Primary dysmenorrhea 10/05/2016   Negative laparoscopy for endometriosis     Tobacco use disorder 10/28/2015    Past Surgical History:  Procedure Laterality Date   LAPAROSCOPY N/A 06/12/2016   Procedure: LAPAROSCOPY DIAGNOSTIC;  Surgeon: Harland JAYSON Birkenhead, MD;  Location: [facility];  Service: Gynecology;  Laterality: N/A;    The following portions of the patient's history were reviewed and updated as appropriate: allergies, current medications, past family history, past  medical history, past social history, past surgical history and problem list.   Health Maintenance:   Last pap:  Result Date Procedure Results Follow-ups  01/23/2022 Cytology - PAP( [facility]) Neisseria Gonorrhea: Negative Chlamydia: Negative Trichomonas: Negative Adequacy: Satisfactory for evaluation; transformation zone component PRESENT. Diagnosis: - Negative for Intraepithelial Lesions or Malignancy (NILM) Diagnosis: - Benign reactive/reparative changes Comment: Normal Reference Ranger Chlamydia - Negative Comment: Normal Reference Range Neisseria Gonorrhea - Negative Comment: Normal Reference Range Trichomonas - Negative   08/13/2019 Cytology - PAP Neisseria Gonorrhea: Negative Chlamydia: Negative Trichomonas: Negative Adequacy: Satisfactory for evaluation; transformation zone component PRESENT. Diagnosis: - Negative for intraepithelial lesion or malignancy (NILM) Comment: Normal Reference Ranger Chlamydia - Negative Comment: Normal Reference Range Neisseria Gonorrhea - Negative Comment: Normal Reference Range Trichomonas - Negative   12/23/2014 HM PAP SMEAR HM Pap smear: normal     Last mammogram:  N/a    Hepatitis serologies: Lab Results  Component Value Date   HAV Negative 07/07/2024   HEPCAB NON-REACTIVE 07/25/2017    Hep A Immunization:   Hep B Immunization:   Last LFTs: Lab Results  Component Value Date   ALT 13 03/17/2024   AST 29 03/17/2024   ALKPHOS 98 03/17/2024   BILITOT 0.6 03/17/2024     Review of Systems:  Pertinent items noted in HPI and remainder of comprehensive ROS otherwise negative.  Physical Exam:  BP 105/67   Pulse 65   Wt 131 lb 3.2 oz (59.5 kg)   Breastfeeding No   BMI 24.00 kg/m  CONSTITUTIONAL: Well-developed, well-nourished female in no acute distress.  HEENT:  Normocephalic, atraumatic. External right and left ear normal. No scleral icterus.  NECK: Normal range of motion, supple, no masses noted on observation SKIN: No  rash noted. Not diaphoretic. No erythema. No pallor. MUSCULOSKELETAL: Normal range of motion. No edema noted. NEUROLOGIC: Alert and oriented to person, place, and time. Normal muscle tone coordination.  PSYCHIATRIC: Normal mood and affect. Normal behavior. Normal judgment and thought content. RESPIRATORY: Effort normal, no problems with respiration noted   Labs and Imaging I have reviewed the PDMP during this encounter.    Last UDS: Lab Results  Component Value Date   CREATIUR 129 10/27/2024     No results found for this or any previous visit (from the past week). No results found.      Assessment and Plan:   Problem List Items Addressed This Visit       Other   History of anemia   S/p IV iron , worried she is having similar symptoms of anemia, check B profile today per her request      Relevant Orders   Anemia Profile B   Opioid use disorder in remission - Primary   Doing well, UDS today, as long as c/w history can space to q2 months Refill sent today Told patient in now way needs to go through withdrawal if she can't be seen in clinic before next refill, just send a message and we will send a refill even if its a shorter refill until her next visit      Relevant Medications   Buprenorphine  HCl-Naloxone  HCl (SUBOXONE ) 8-2 MG FILM   Other Relevant Orders   ToxAssure Flex 15, Ur   Oral contraceptive pill surveillance   Encouraged to have backup plan (condoms, etc.), has OCP's at home if she decides to start      *Needs pap at next visit*   Return in about 2 months (around 01/29/2025) for REACH clinic, OUD f/u.    Total face-to-face time with patient: 15 minutes.  Over 50% of encounter was spent on counseling and coordination of care.    Donnice CHRISTELLA Carolus, MD/MPH Attending Family Medicine Physician, [facility] for [facility], [facility] "

## 2024-12-01 NOTE — Assessment & Plan Note (Signed)
 Encouraged to have backup plan (condoms, etc.), has OCP's at home if she decides to start

## 2024-12-01 NOTE — Assessment & Plan Note (Signed)
 S/p IV iron , worried she is having similar symptoms of anemia, check B profile today per her request

## 2024-12-01 NOTE — Assessment & Plan Note (Addendum)
 Doing well, UDS today, as long as c/w history can space to q2 months Refill sent today Told patient in now way needs to go through withdrawal if she can't be seen in clinic before next refill, just send a message and we will send a refill even if its a shorter refill until her next visit

## 2024-12-05 LAB — TOXASSURE FLEX 15, UR
6-ACETYLMORPHINE IA: NEGATIVE ng/mL
7-aminoclonazepam: NOT DETECTED ng/mg{creat}
AMPHETAMINES IA: NEGATIVE ng/mL
Alpha-hydroxyalprazolam: NOT DETECTED ng/mg{creat}
Alpha-hydroxymidazolam: NOT DETECTED ng/mg{creat}
Alpha-hydroxytriazolam: NOT DETECTED ng/mg{creat}
Alprazolam: NOT DETECTED ng/mg{creat}
BARBITURATES IA: NEGATIVE ng/mL
BUPRENORPHINE: POSITIVE
Benzodiazepines: NEGATIVE
Buprenorphine: 411 ng/mg{creat}
CANNABINOIDS IA: NEGATIVE ng/mL
COCAINE METABOLITE IA: NEGATIVE ng/mL
Clonazepam: NOT DETECTED ng/mg{creat}
Creatinine: 172 mg/dL
Desalkylflurazepam: NOT DETECTED ng/mg{creat}
Desmethyldiazepam: NOT DETECTED ng/mg{creat}
Desmethylflunitrazepam: NOT DETECTED ng/mg{creat}
Diazepam: NOT DETECTED ng/mg{creat}
ETHYL ALCOHOL Enzymatic: NEGATIVE g/dL
FENTANYL: NEGATIVE
Fentanyl: NOT DETECTED ng/mg{creat}
Flunitrazepam: NOT DETECTED ng/mg{creat}
Lorazepam: NOT DETECTED ng/mg{creat}
METHADONE IA: NEGATIVE ng/mL
METHADONE MTB IA: NEGATIVE ng/mL
Midazolam: NOT DETECTED ng/mg{creat}
Norbuprenorphine: >581 ng/mg{creat}
Norfentanyl: NOT DETECTED ng/mg{creat}
OPIATE CLASS IA: NEGATIVE ng/mL
OXYCODONE CLASS IA: NEGATIVE ng/mL
Oxazepam: NOT DETECTED ng/mg{creat}
PHENCYCLIDINE IA: NEGATIVE ng/mL
TAPENTADOL, IA: NEGATIVE ng/mL
TRAMADOL IA: NEGATIVE ng/mL
Temazepam: NOT DETECTED ng/mg{creat}

## 2024-12-06 ENCOUNTER — Emergency Department
Admission: EM | Admit: 2024-12-06 | Discharge: 2024-12-06 | Disposition: A | Discharge status: Home / Self Care | Attending: Emergency Medicine | Admitting: Emergency Medicine

## 2024-12-06 ENCOUNTER — Emergency Department

## 2024-12-06 ENCOUNTER — Other Ambulatory Visit: Payer: Self-pay

## 2024-12-06 DIAGNOSIS — S9032XA Contusion of left foot, initial encounter: Secondary | ICD-10-CM | POA: Insufficient documentation

## 2024-12-06 DIAGNOSIS — X509XXA Other and unspecified overexertion or strenuous movements or postures, initial encounter: Secondary | ICD-10-CM | POA: Diagnosis not present

## 2024-12-06 DIAGNOSIS — M25572 Pain in left ankle and joints of left foot: Secondary | ICD-10-CM | POA: Diagnosis not present

## 2024-12-06 DIAGNOSIS — S99922A Unspecified injury of left foot, initial encounter: Secondary | ICD-10-CM | POA: Diagnosis present

## 2024-12-06 NOTE — ED Provider Notes (Signed)
 "  [facility] Provider Note    Event Date/Time   First MD Initiated Contact with Patient 12/06/24 1624     (approximate)   History   Foot Pain   HPI  Connie Fernandez is a 29 y.o. female  with a past medical history of bipolar 2 disorder, MDD, anxiety, opioid use disorder in remission, on Suboxone  presents to the emergency department with left foot and ankle pain that started this morning.  She reports she got up in the middle of the morning to use the bathroom and heard a crack of her left foot/ankle. She is unsure if she hit her foot or ankle on any specific object. She denies fall or any other injury.  She has ambulated on the extremity since the incident.   Physical Exam   Triage Vital Signs: ED Triage Vitals  Encounter Vitals Group     BP 12/06/24 1336 (!) 117/54     Girls Systolic BP Percentile --      Girls Diastolic BP Percentile --      Boys Systolic BP Percentile --      Boys Diastolic BP Percentile --      Pulse Rate 12/06/24 1336 61     Resp 12/06/24 1336 18     Temp 12/06/24 1336 98.1 F (36.7 C)     Temp src --      SpO2 12/06/24 1336 100 %     Weight 12/06/24 1335 131 lb (59.4 kg)     Height 12/06/24 1335 5' 2 (1.575 m)     Head Circumference --      Peak Flow --      Pain Score 12/06/24 1335 8     Pain Loc --      Pain Education --      Exclude from Growth Chart --     Most recent vital signs: Vitals:   12/06/24 1336 12/06/24 1707  BP: (!) 117/54   Pulse: 61   Resp: 18   Temp: 98.1 F (36.7 C)   SpO2: 100% 100%    General: Awake, in no acute distress. Appears stated age. CV: Good peripheral perfusion.  Respiratory:Normal respiratory effort.  No respiratory distress.  MSK: Normal ROM and 5/5 strength in b/l ankles and toes. extremities. TTP along anterolateral aspect of left foot and along base of 5th metatarsal. Skin:Warm, dry, intact.  Contusion noted to the anterolateral aspect of the left foot, no overlying  swelling or excessive warmth or erythema.  Neurological: Sensation intact and equal to bilateral ankles and toes.  ED Results / Procedures / Treatments   Labs (all labs ordered are listed, but only abnormal results are displayed) Labs Reviewed - No data to display   EKG     RADIOLOGY X rays left foot and ankle.    PROCEDURES:  Critical Care performed: No   Procedures   MEDICATIONS ORDERED IN ED: Medications - No data to display   IMPRESSION / MDM / ASSESSMENT AND PLAN / ED COURSE  I reviewed the triage vital signs and the nursing notes.                              Differential diagnosis includes, but is not limited to, left foot contusion, Jones fracture, metatarsal fracture, ankle sprain  Patient's presentation is most consistent with acute complicated illness / injury requiring diagnostic workup.  Patient is a 29 year old female presenting with signs and  symptoms as described above.  Left foot x-ray ordered in triage and was negative for any acute findings.  She is neurovascularly intact.  No signs of infection of the foot however there is a notable contusion on exam.  Given she was tender to the base of the fifth metatarsal I did order an ankle x-ray which was also negative for any acute findings per my individual interpretation.  Discussed RICE, offered cam boot to the patient to use as needed and discussed over-the-counter medication use for pain.  She can follow-up with her primary care provider or podiatry as needed in a few weeks if not feeling better.  The patient may return to the emergency department for any new, worsening, or concerning symptoms. Patient was given the opportunity to ask questions; all questions were answered. Emergency department return precautions were discussed with the patient.  Patient is in agreement to the treatment plan.  Patient is stable for discharge.   FINAL CLINICAL IMPRESSION(S) / ED DIAGNOSES   Final diagnoses:  Contusion of  left foot, initial encounter     Rx / DC Orders   ED Discharge Orders     None        Note:  This document was prepared using Dragon voice recognition software and may include unintentional dictation errors.     Sheron [place], [place] 12/07/24 1935  "

## 2024-12-06 NOTE — Discharge Instructions (Addendum)
 You were seen in the emergency department for a bruise of your foot.  You may have a sprain of one of the foot or ankle ligaments.  Use ice for the first 24 hours then you may apply heat to the area.  Continue your regular pain medication regimen at home and you may use Tylenol  and ibuprofen  for pain.  If anything worsens or if this is not getting better within the next 1 to 2 weeks you can follow-up with the podiatrist, Dr. Lennie listed in this paperwork. Wear the boot as needed for support.

## 2024-12-06 NOTE — ED Triage Notes (Signed)
 Pt come with left foot pain that started this morning. Pt got up to go to the bathroom and heard a crack.

## 2024-12-07 ENCOUNTER — Ambulatory Visit: Payer: Self-pay | Admitting: Family Medicine

## 2024-12-08 ENCOUNTER — Telehealth: Payer: Self-pay
